# Patient Record
Sex: Female | Born: 1935 | Race: Black or African American | Hispanic: No | Marital: Married | Smoking: Never smoker
Health system: Southern US, Community
[De-identification: ages and names within clinical notes are randomized; demographics above are authoritative.]

## PROBLEM LIST (undated history)

## (undated) DIAGNOSIS — F0390 Unspecified dementia without behavioral disturbance: Secondary | ICD-10-CM

## (undated) DIAGNOSIS — C50919 Malignant neoplasm of unspecified site of unspecified female breast: Secondary | ICD-10-CM

## (undated) DIAGNOSIS — I208 Other forms of angina pectoris: Secondary | ICD-10-CM

## (undated) DIAGNOSIS — E785 Hyperlipidemia, unspecified: Secondary | ICD-10-CM

## (undated) DIAGNOSIS — I4891 Unspecified atrial fibrillation: Secondary | ICD-10-CM

## (undated) DIAGNOSIS — I10 Essential (primary) hypertension: Secondary | ICD-10-CM

## (undated) DIAGNOSIS — I2089 Other forms of angina pectoris: Secondary | ICD-10-CM

## (undated) DIAGNOSIS — I219 Acute myocardial infarction, unspecified: Secondary | ICD-10-CM

## (undated) DIAGNOSIS — K5900 Constipation, unspecified: Secondary | ICD-10-CM

## (undated) DIAGNOSIS — N289 Disorder of kidney and ureter, unspecified: Secondary | ICD-10-CM

## (undated) DIAGNOSIS — K219 Gastro-esophageal reflux disease without esophagitis: Secondary | ICD-10-CM

## (undated) HISTORY — PX: LYMPHADENECTOMY: SHX15

## (undated) HISTORY — PX: HIATAL HERNIA REPAIR: SHX195

## (undated) HISTORY — PX: BREAST SURGERY: SHX581

---

## 1997-12-26 ENCOUNTER — Ambulatory Visit (HOSPITAL_COMMUNITY): Admission: RE | Admit: 1997-12-26 | Discharge: 1997-12-26 | Payer: Self-pay | Admitting: *Deleted

## 1998-05-21 ENCOUNTER — Ambulatory Visit (HOSPITAL_COMMUNITY): Admission: RE | Admit: 1998-05-21 | Discharge: 1998-05-21 | Payer: Self-pay | Admitting: *Deleted

## 1998-08-08 ENCOUNTER — Encounter: Payer: Self-pay | Admitting: Ophthalmology

## 1998-08-11 ENCOUNTER — Ambulatory Visit (HOSPITAL_COMMUNITY): Admission: RE | Admit: 1998-08-11 | Discharge: 1998-08-11 | Payer: Self-pay | Admitting: Ophthalmology

## 1999-04-20 ENCOUNTER — Other Ambulatory Visit: Admission: RE | Admit: 1999-04-20 | Discharge: 1999-04-20 | Payer: Self-pay | Admitting: *Deleted

## 1999-07-16 ENCOUNTER — Encounter: Admission: RE | Admit: 1999-07-16 | Discharge: 1999-07-16 | Payer: Self-pay | Admitting: *Deleted

## 1999-11-12 ENCOUNTER — Encounter: Payer: Self-pay | Admitting: Ophthalmology

## 1999-11-16 ENCOUNTER — Ambulatory Visit (HOSPITAL_COMMUNITY): Admission: RE | Admit: 1999-11-16 | Discharge: 1999-11-16 | Payer: Self-pay | Admitting: Ophthalmology

## 2000-07-18 ENCOUNTER — Encounter: Admission: RE | Admit: 2000-07-18 | Discharge: 2000-07-18 | Payer: Self-pay | Admitting: Internal Medicine

## 2000-07-18 ENCOUNTER — Encounter: Payer: Self-pay | Admitting: Internal Medicine

## 2000-10-18 ENCOUNTER — Ambulatory Visit (HOSPITAL_COMMUNITY): Admission: RE | Admit: 2000-10-18 | Discharge: 2000-10-18 | Payer: Self-pay | Admitting: Ophthalmology

## 2003-02-01 ENCOUNTER — Encounter: Admission: RE | Admit: 2003-02-01 | Discharge: 2003-02-01 | Payer: Self-pay | Admitting: Internal Medicine

## 2003-02-01 ENCOUNTER — Encounter: Payer: Self-pay | Admitting: Internal Medicine

## 2004-02-11 ENCOUNTER — Encounter: Admission: RE | Admit: 2004-02-11 | Discharge: 2004-02-11 | Payer: Self-pay | Admitting: Internal Medicine

## 2005-03-05 ENCOUNTER — Encounter: Admission: RE | Admit: 2005-03-05 | Discharge: 2005-03-05 | Payer: Self-pay | Admitting: Internal Medicine

## 2006-03-10 ENCOUNTER — Encounter: Admission: RE | Admit: 2006-03-10 | Discharge: 2006-03-10 | Payer: Self-pay | Admitting: Internal Medicine

## 2006-03-15 ENCOUNTER — Encounter: Admission: RE | Admit: 2006-03-15 | Discharge: 2006-03-15 | Payer: Self-pay | Admitting: Internal Medicine

## 2006-03-15 ENCOUNTER — Encounter (INDEPENDENT_AMBULATORY_CARE_PROVIDER_SITE_OTHER): Payer: Self-pay | Admitting: Specialist

## 2006-05-03 DIAGNOSIS — I219 Acute myocardial infarction, unspecified: Secondary | ICD-10-CM

## 2006-05-03 HISTORY — DX: Acute myocardial infarction, unspecified: I21.9

## 2006-05-10 ENCOUNTER — Encounter: Admission: RE | Admit: 2006-05-10 | Discharge: 2006-05-10 | Payer: Self-pay | Admitting: Surgery

## 2006-05-11 ENCOUNTER — Encounter (INDEPENDENT_AMBULATORY_CARE_PROVIDER_SITE_OTHER): Payer: Self-pay | Admitting: Surgery

## 2006-05-11 ENCOUNTER — Encounter (INDEPENDENT_AMBULATORY_CARE_PROVIDER_SITE_OTHER): Payer: Self-pay | Admitting: Specialist

## 2006-05-11 ENCOUNTER — Encounter: Admission: RE | Admit: 2006-05-11 | Discharge: 2006-05-11 | Payer: Self-pay | Admitting: Surgery

## 2006-05-11 ENCOUNTER — Ambulatory Visit (HOSPITAL_BASED_OUTPATIENT_CLINIC_OR_DEPARTMENT_OTHER): Admission: RE | Admit: 2006-05-11 | Discharge: 2006-05-11 | Payer: Self-pay | Admitting: Surgery

## 2006-05-19 ENCOUNTER — Ambulatory Visit: Payer: Self-pay | Admitting: Oncology

## 2006-05-30 ENCOUNTER — Ambulatory Visit: Admission: RE | Admit: 2006-05-30 | Discharge: 2006-08-03 | Payer: Self-pay | Admitting: *Deleted

## 2006-05-30 LAB — CBC WITH DIFFERENTIAL/PLATELET
Basophils Absolute: 0 10*3/uL (ref 0.0–0.1)
EOS%: 1.3 % (ref 0.0–7.0)
Eosinophils Absolute: 0.1 10*3/uL (ref 0.0–0.5)
HCT: 35.2 % (ref 34.8–46.6)
HGB: 12.1 g/dL (ref 11.6–15.9)
MCH: 32 pg (ref 26.0–34.0)
MCV: 93 fL (ref 81.0–101.0)
MONO%: 16.7 % — ABNORMAL HIGH (ref 0.0–13.0)
NEUT#: 4.6 10*3/uL (ref 1.5–6.5)
NEUT%: 55.9 % (ref 39.6–76.8)
Platelets: 384 10*3/uL (ref 145–400)

## 2006-05-30 LAB — COMPREHENSIVE METABOLIC PANEL
AST: 17 U/L (ref 0–37)
Albumin: 3.9 g/dL (ref 3.5–5.2)
Alkaline Phosphatase: 47 U/L (ref 39–117)
BUN: 18 mg/dL (ref 6–23)
Calcium: 9.2 mg/dL (ref 8.4–10.5)
Chloride: 105 mEq/L (ref 96–112)
Creatinine, Ser: 1.27 mg/dL — ABNORMAL HIGH (ref 0.40–1.20)
Glucose, Bld: 101 mg/dL — ABNORMAL HIGH (ref 70–99)

## 2006-09-06 ENCOUNTER — Ambulatory Visit: Payer: Self-pay | Admitting: Cardiology

## 2006-09-06 ENCOUNTER — Inpatient Hospital Stay (HOSPITAL_COMMUNITY): Admission: EM | Admit: 2006-09-06 | Discharge: 2006-09-13 | Payer: Self-pay | Admitting: Emergency Medicine

## 2006-09-07 ENCOUNTER — Encounter: Payer: Self-pay | Admitting: Cardiology

## 2006-11-25 ENCOUNTER — Ambulatory Visit: Payer: Self-pay | Admitting: Oncology

## 2007-03-13 ENCOUNTER — Encounter: Admission: RE | Admit: 2007-03-13 | Discharge: 2007-03-13 | Payer: Self-pay | Admitting: Surgery

## 2007-05-08 ENCOUNTER — Ambulatory Visit: Payer: Self-pay | Admitting: Cardiology

## 2007-05-08 ENCOUNTER — Inpatient Hospital Stay (HOSPITAL_COMMUNITY): Admission: EM | Admit: 2007-05-08 | Discharge: 2007-06-01 | Payer: Self-pay | Admitting: Emergency Medicine

## 2008-03-14 ENCOUNTER — Encounter: Admission: RE | Admit: 2008-03-14 | Discharge: 2008-03-14 | Payer: Self-pay | Admitting: Internal Medicine

## 2008-05-27 ENCOUNTER — Encounter (HOSPITAL_BASED_OUTPATIENT_CLINIC_OR_DEPARTMENT_OTHER): Admission: RE | Admit: 2008-05-27 | Discharge: 2008-07-04 | Payer: Self-pay | Admitting: Internal Medicine

## 2008-09-19 ENCOUNTER — Encounter: Admission: RE | Admit: 2008-09-19 | Discharge: 2008-09-19 | Payer: Self-pay | Admitting: Internal Medicine

## 2009-01-12 IMAGING — CT CT ANGIO CHEST
2 of 5 series · 18 of 36 positions shown · IV contrast (APPLIED)
Comparison: Plain film earlier today. No CT's.

CLINICAL DATA: Right upper quadrant pain. Recent lumpectomy and radiation
therapy.

CT ANGIOGRAPHY OF CHEST - PULMONARY EMBOLISM PROTOCOL
TECHNIQUE: Multidetector CT imaging of the chest was performed according to the
protocol for detection of pulmonary embolism during bolus injection of
intravenous contrast.  Coronal and sagittal plane CT angiographic image
reconstructions were also generated.
Contrast:  80 cc Omnipaque 300

[Series 6: pe 1.0 b40f thins for pacs · axial · 0.56mm/px · z∈[-261,-47]mm · 15 of 246 slices shown]
[im 16/246  lung]
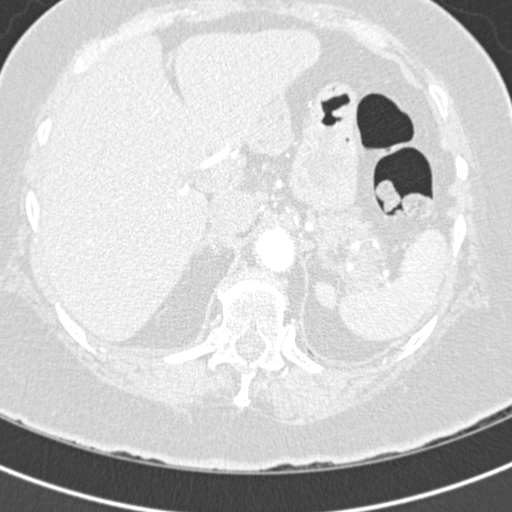
[im 31/246  mediastinal]
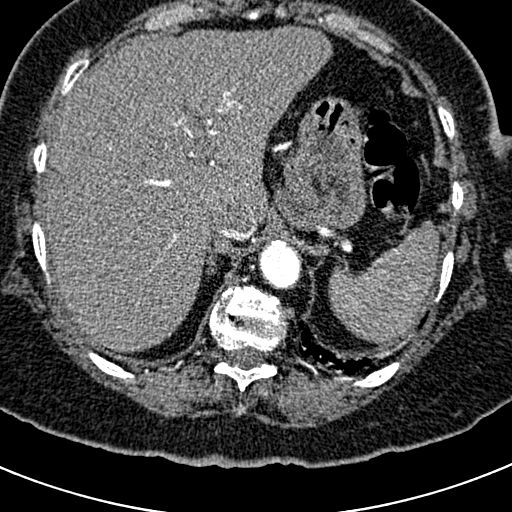
[im 46/246  lung]
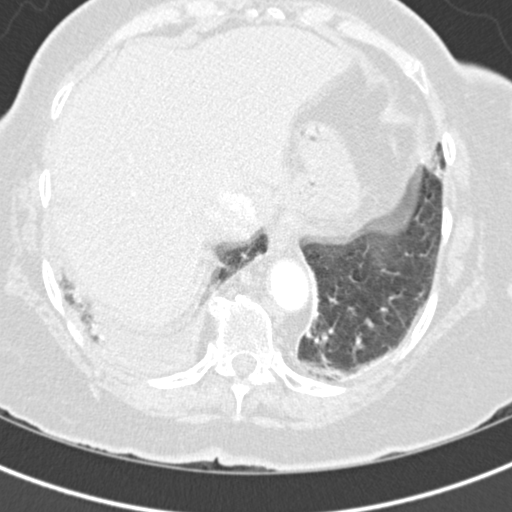
[im 62/246  mediastinal]
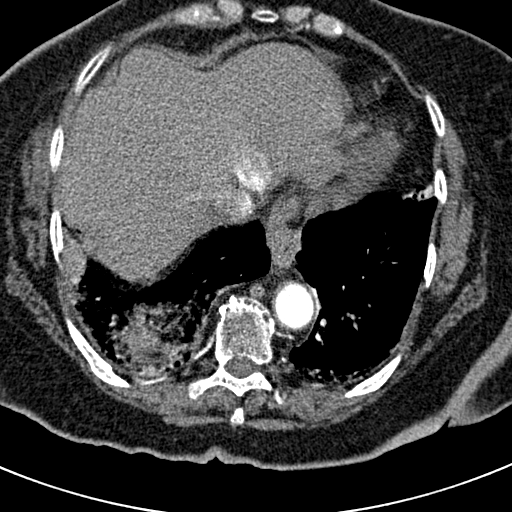
[im 77/246  lung]
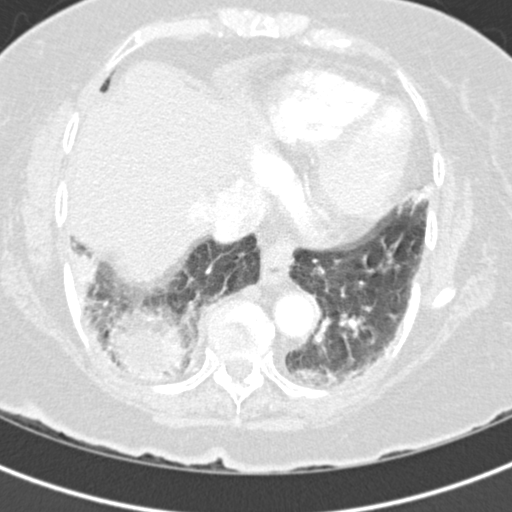
[im 92/246  mediastinal]
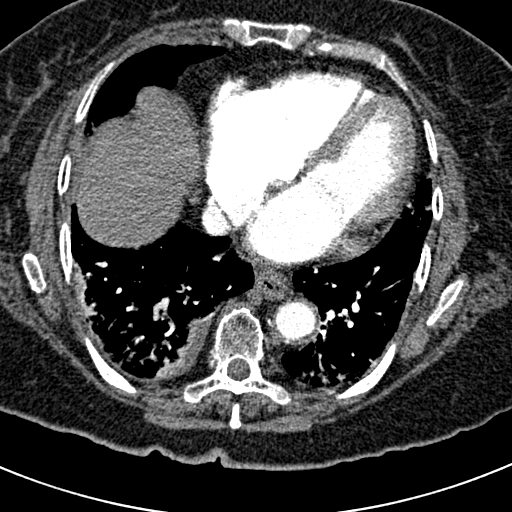
[im 108/246  lung]
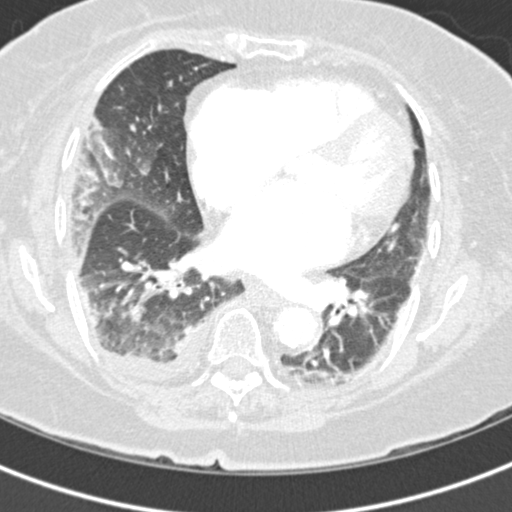
[im 123/246  mediastinal]
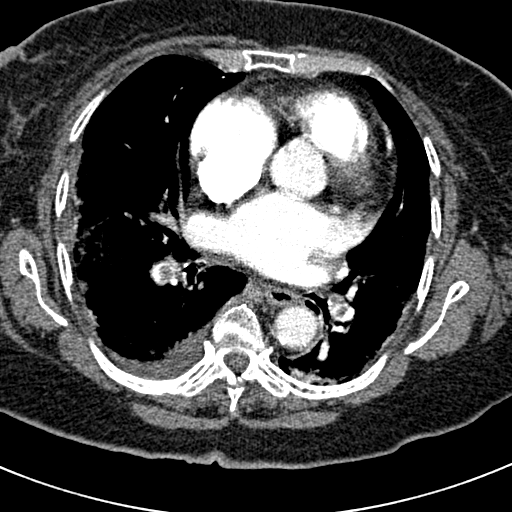
[im 138/246  lung]
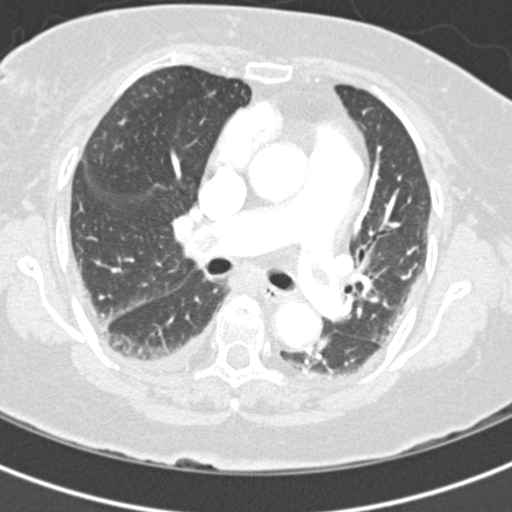
[im 154/246  mediastinal]
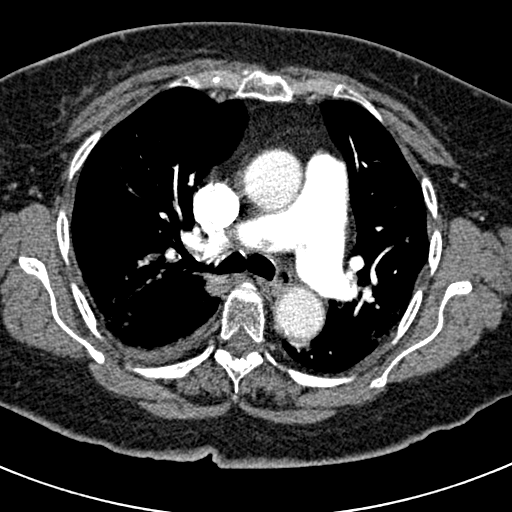
[im 169/246  lung]
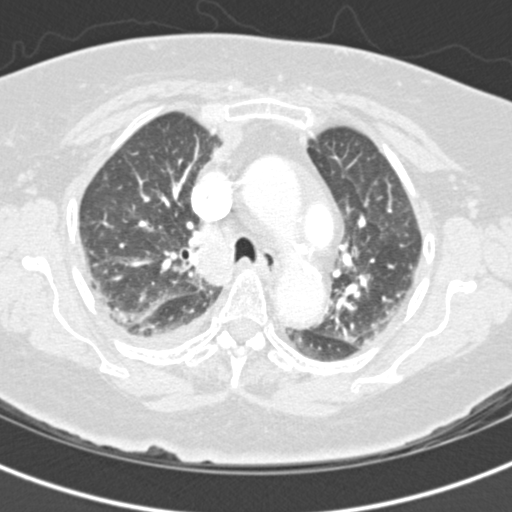
[im 184/246  mediastinal]
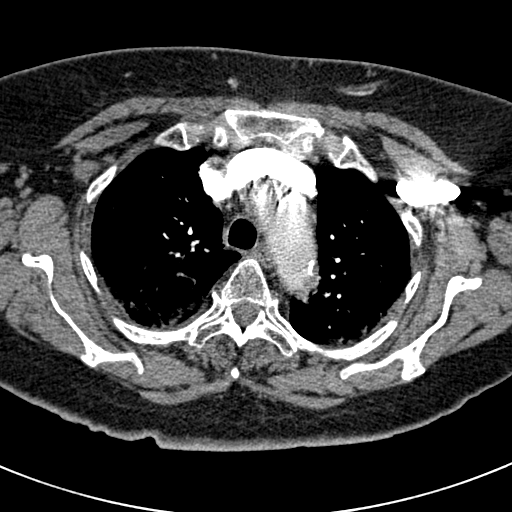
[im 200/246  lung]
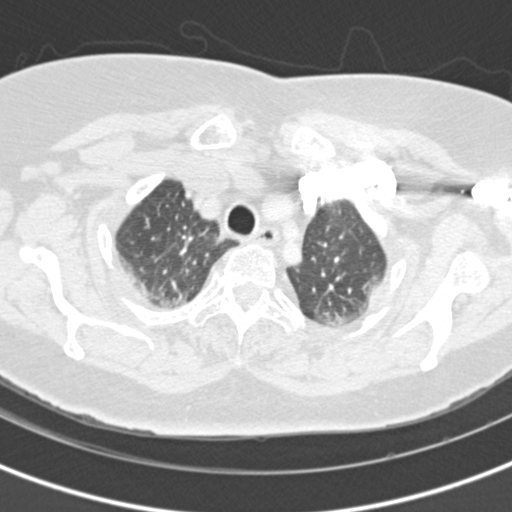
[im 215/246  mediastinal]
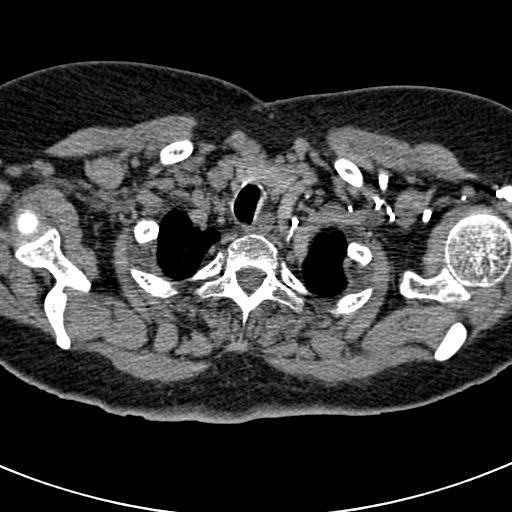
[im 230/246  lung]
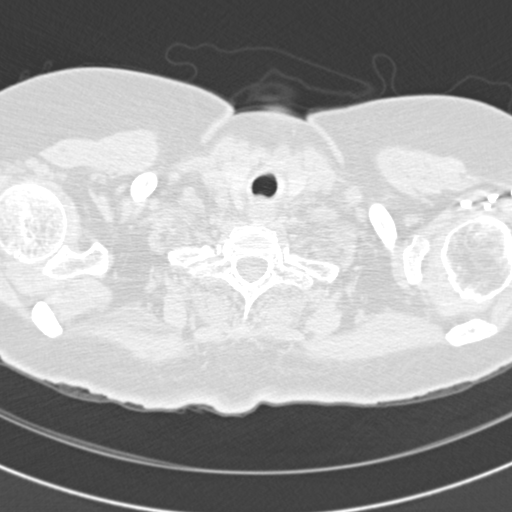

[Series 602: <mpr range> · coronal · 0.56mm/px · 3 of 67 slices shown]
[im 14/67  mediastinal]
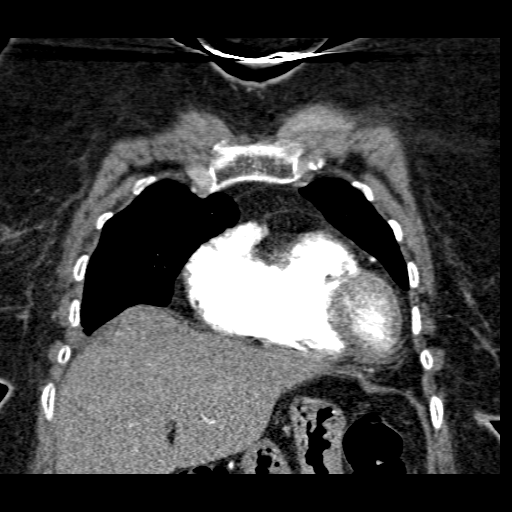
[im 27/67  mediastinal]
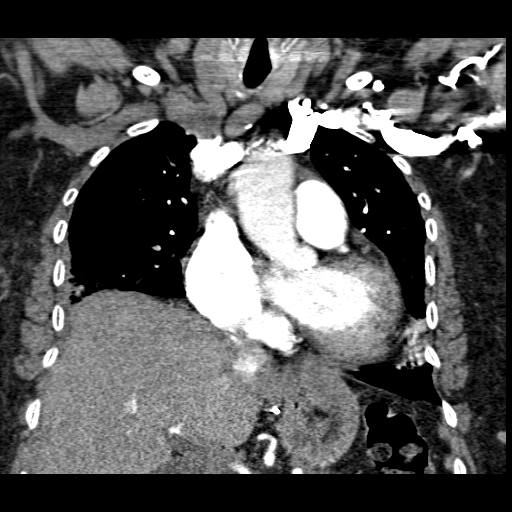
[im 40/67  mediastinal]
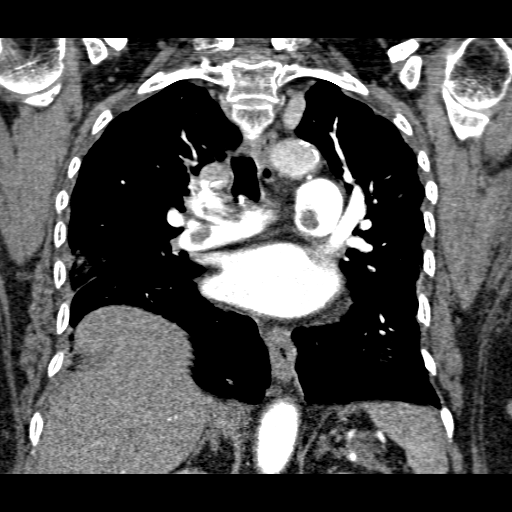

[18 of 36 positions shown; findings below may reference images not displayed]

FINDINGS: Lung windows demonstrate probable vague 3 to 4 mm nodule in right
upper lobe on image 35. Subpleural interlobular septal thickening and
groundglass attenuation in right middle lobe likely related to evolving
radiation pneumonitis.

Right lower lobe opacity may relate to atelectasis or infarct. Moderate amount
of motion artifact involving the lower lobes bilaterally.

Soft tissue windows:  The quality of this exam for evaluation of pulmonary
embolism is good/sufficient. .

Large embolic burden at branch of main pulmonary artery into right and left
pulmonary arteries. Extensive lobar embolic burden greater on right than left.
Findings of increased right heart pressure.

Increased number of right supraclavicular lymph nodes none of which are
pathologic by size.

Moderate cardiomegaly. No pericardial effusion. Small right pleural effusion.
Small hiatal hernia. No mediastinal or hilar adenopathy.

Surgical changes at GE junction. No worrisome osseous lesion.

IMPRESSION

1. Extensive pulmonary embolus. Findings of right heart strain. I called this
report Dr. Seghatoleslami prior to this dictation at approximately [DATE] a.m.
2. Suspect right lower lobe infarct or atelectasis.
3. Developing radiation change at lateral right lung. 
4. probable tiny lung nodule with increased number of right supraclavicular
lymph nodes. Attention on followup. 
5. Small hiatal hernia.

## 2010-09-15 NOTE — Consult Note (Signed)
NAMEWILHELMINA, HARK                ACCOUNT NO.:  [id number]   MEDICAL RECORD NO.:  [id number]          PATIENT TYPE:  INP   LOCATION:  1226                         FACILITY:  [facility]   PHYSICIAN:  Anselm Pancoast. Weatherly, M.D.DATE OF BIRTH:  [birth year]   DATE OF CONSULTATION:  DATE OF DISCHARGE:                                 CONSULTATION   REQUESTING PHYSICIAN:  Hospitalist for rule out small bowel obstruction.   HISTORY:  Sherryn Pollino Care is an 77 year old female who was admitted  approximately 2 weeks ago with onset of worsening low back pain that is  presumably due to a definitely spondylosis of the lower lumbar sacral  area.  The patient previously has had multiple large abdominal  surgeries, GYN and hiatus hernia procedure, and has had a routine of  basically laxatives which we have identified as Metamucil from [employer]  that she takes twice a day, and as long as she was active and etc., her  bowel function were regular.  She was hospitalized with a acute onset of  back pain and admitted on May 08, 2007, and a ortho consult was  obtained.  She has a history of chronic atrial fibrillation, and during  the first few days of her hospitalization, she was treated with pain  medication.  She did have initially an elevated white count and  basically immobilization and then started becoming gaseous, had problems  with diarrhea for which they have actually even used a rectal tube.  The  C. diff's have been repeatedly negative, and after about 2 weeks of  hospitalization, a surgical consultation was requested.  I first saw the  patient on the 91, 17, 19th.  At that time, she had had a CT several  days earlier that had not shown may ischemic obviously colon or an  obvious bowel obstruction but was consistent with an ileus.   PHYSICAL EXAMINATION:  GENERAL:  She was lying in bed, appeared  comfortable.  She is quite gaseous and has a well-healed large midline  incision, but it was not  tender in any quadrant of her abdomen.  Plain x-  rays on the previous day or two had definitely shown a significant small  bowel dilatation, but there was gas in the colon, and there has even  been extreme dilatation before the NG was placed of her stomach.  Her  potassium was also quite low at about 2.4 and with correcting of the  hypokalemia and cutting back on the narcotics, her bowel functions did  improve, and over the next 48 hours she did not have extreme bilious NG  drainage and was more like kind of usual gastric contents.  Yesterday,  we repeated the CT, since there was still some question the rectal tube  has been removed and she is not having the extensive diarrhea at this  time, and the CT yesterday did not show any thickening areas of her  colon.  Her gallbladder is still present, not acutely dilated or any  symptoms, and they are still more gaseous in the small bowel than usual  when she  burps a lot, but she is having bowel movements after the  contrast of yesterday's CT.  She is fully anticoagulated.  The  orthopedic people have seen her and suggested that she had a steroid  shot of the lower back, it was with a full anticoagulation that makes  the steroid injection not possible.   This morning, her potassium is 3.1 and it has been as high as 3.8  yesterday, so she is obviously going to be oral potassium replacement,  and I would go ahead and start her on a full liquid diet and a laxative  of choice, whether it is milk of magnesia or any other laxative.  She  presently has been chronically taking the Metamucil, and then tried to  address the back issues since the pain has been the issue that she was  initially immobilized.  I think the immobilization and the narcotics are  greatly, if not causing, the intestinal distention.  There is no  evidence of any hernias, incisionals or groin or pelvis on the repeat  CTs or physical exam, and she is not tender on the abdominal exam  at  this time.  The patient has been encouraged to be up in a chair and  moving around, and there is many excuses while she is fearful in  attempting this, but I think that without the mobilization, we are not  going to get any good bowel function irregularity.  I think there is no  indication for any surgical intervention at this time, and hopefully it  not will be needed.  Her diarrhea, the etiology was never definitely  determined, is not known the cause.  She is presently on antibiotics, and I am not sure exactly what, I think  presumably it was for a possible pneumonia.  I think the up in a chair  and etc. with greatly assist that also, and we will continue following  her, but I think her primary problem is the back problem and activities,  and less narcotics would greatly improve her intestinal function.           ______________________________  Anselm Pancoast. Zachery Dakins, M.D.     WJW/MEDQ  D:  05/26/2007  T:  05/26/2007  Job:  [id number]   cc:   Anselm Pancoast. Zachery Dakins, M.D.  [address]  [place]  [place] [address]   Patient Chart

## 2010-09-15 NOTE — Discharge Summary (Signed)
Lisa Nixon, Lisa Nixon                ACCOUNT NO.:  [id number]   MEDICAL RECORD NO.:  [id number]          PATIENT TYPE:  INP   LOCATION:  1444                         FACILITY:  [facility]   PHYSICIAN:  Beckey Rutter, MD  DATE OF BIRTH:  17-Aug-1922   DATE OF ADMISSION:  05/08/2007  DATE OF DISCHARGE:  06/01/2007                               DISCHARGE SUMMARY   ADDENDUM:  Please amend this discharge summary to the previous discharge  summary dictated by Dr. Marthann Schiller and to the previous discharge  summation dictated earlier during the hospital course.  To avoid  redundancy, I will not go into detail about her hospital course or her  discharge diagnosis, but I will concentrate on providing the medication  list.   DISCHARGE DIAGNOSIS:  Please refer to the previously dictated discharge  summary.   DISCHARGE MEDICATIONS:  1. Digoxin 0.25 mg p.o. every other day.  2. Digoxin 0.125 mg every 48 hours.  Dose medication should be      alternating (i.e. one day the patient will take 0.2 mg and the      other day she will take 0.125 mg).  3. Tiazac/Diltiazem 300 mg p.o. daily.  4. Ensure 2 cans daily.  5. Lidocaine topical application daily.  6. Ditropan 2.5 mg p.o. t.i.d.  7. K-Dur 20 mEq p.o. daily.  8. Inderal/propranolol 50 mg p.o. b.i.d.  9. Zocor 40 mg p.o. q.h.s.  10.Hydromorphone/Dilaudid 1-2 mg p.o. q.4h. for severe pain.  11.Coumadin, 1-4 mg p.o. h.s.  Dose should be adjusted as per INR.      Please note the patient was on Coumadin previously which was      discontinued for the patient's epidural injection which was done on      May 30, 2007 by interventional radiology.  Now the patient will      have her Coumadin reinstated for a therapeutic level of INR of 2 to      3.  This is not necessarily to bridge with Lovenox/ unfractionated      heparin because of the fact that the patient was already on      Coumadin and was shortly discontinued.  12.Senokot one tab p.o.  q.h.s.   The patient is stable to be released to a skilled nursing facility today  for rehabilitation purposes.  The patient is severely deconditioned now  and she cannot be discharged to home.      Beckey Rutter, MD  Electronically Signed     EME/MEDQ  D:  06/01/2007  T:  06/01/2007  Job:  [id number]

## 2010-09-15 NOTE — Consult Note (Signed)
Lisa Nixon, Lisa Nixon                ACCOUNT NO.:  [id number]   MEDICAL RECORD NO.:  [id number]          PATIENT TYPE:  REC   LOCATION:  FOOT                         FACILITY:  [facility]   PHYSICIAN:  Jonelle Sports. Sevier, M.D. DATE OF BIRTH:  08-20-1922   DATE OF CONSULTATION:  05/29/2008  DATE OF DISCHARGE:                                 CONSULTATION   HISTORY:  This 77 year old black female was seen at the courtesy of Dr.  [name] for consultation and management of an area on the right  lower extremity thought to represent a possible abscess.   The patient has had several medical problems which will be outlined  below, but basically has been compensated and generally stable.  She  does have a history of old pulmonary embolism 2 years ago and has had  venous studies in her extremities, which have not shown any venous  thrombosis.   With that background history, she began rather abruptly some 3 weeks ago  with an area of extreme redness on the pretibial area of her right lower  extremity and approximately the distal one third of the tibia and this  inflammation spread rapidly toward the medial malleolar area and around  posteriorly on that ankle.  This was recognizes by Dr. [name] as a  cellulitis and she was treated accordingly initially with intramuscular  Rocephin and she was subsequently then changed to oral doxycycline.  She  reported rapid progressive healing of the spreading cellulitis, but  developed a new persisting area of swelling in the proximal aspect of  this area of cellulitis just over the anterior tibial area.  This has  never drained, is not particularly tender, but has not responded to  ongoing course with doxycycline.   She is here now for our evaluation and advice.   PAST SURGICAL HISTORY:  The patient had hiatal hernia repair some 10  years ago, had two C sections in the 1950s and had a lumpectomy from her  right breast in 2007.  She has had other  hospitalizations in conjunction  with heart attack some 10 years ago.  She was also hospitalized about 3  years ago for her pulmonary embolism.   She has no known medicinal allergies.   Her regular medications include;  1. Aciphex 20 mg daily.  2. Diltiazem 300 mg daily.  3. Lanoxin 0.125 mg daily.  4. Bisoprolol 10 mg daily.  5. Klor-Con 20 mEq daily.  6. Simvastatin 40 mg daily.  7. Propranolol 40 mg daily in addition to 10 more mg daily.  8. Hydrocodone APAP p.r.n. pain.  9. Her doxycycline as per present illness.  10.Furosemide 40 mg daily.  11.Coumadin by schedule.  12.Vitamin B12 daily.  13.Vitamin D 1000 units daily.  14.Calcium with vitamin D 600 mg daily.   PERSONAL HISTORY:  The patient is married, does not smoke, use alcohol  or recreational drugs.  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  She now does some  kind of minimal assistive help at [facility].  FAMILY HISTORY:  Not reviewed in detail, but it is known that her mother  had hypertension and have a heart attack.   REVIEW OF SYSTEMS:  HEAD, EYES, EARS, NOSE, AND THROAT:  She wears  hearing aids and glasses, but otherwise does quite well.  PULMONARY:  She has had some shortness of breath in association with her  cardiac problems, but has no known primary lung disease.  She did have  pulmonary embolism 3 years ago.  CARDIAC:  The patient has a history of heart attack some 10 years ago,  has never had apparently any surgical intervention in that regard.  She  is unaware of any rhythm disturbances.  She has had some edema, but this  was well controlled by her current medication.  She has been shortness  of breath at times when her heart disease is out of control as  indicated.  She does not have ongoing angina.  GASTROINTESTINAL:  Does have reflux problems and a great deal of  eructation again treated with medication as indicated.  No history  of  blood in the stool or hematemesis.  GENITOURINARY:  No history of urinary tract problems, kidney stones, or  renal insufficiency.  HEMATOLOGIC:  No problems with anemia or other blood disorders.  MUSCULOSKELETAL:  Had injury to her low back some 30 years ago and also  has some degree of osteoarthritis.  NEUROLOGIC:  She is said to have numbness in the legs thought to be on  the basis of neuropathy, but has never had cerebrovascular accident or  seizures.  ENDOCRINE:  No history of diabetes.   PHYSICAL EXAMINATION:  Blood pressure 146/88, pulse 92, respirations 20,  temperature 97.8.  The patient is in no distress, appears healthy, and  actually appears younger than her stated age of 15.  She is in no  respiratory distress at the moment.  Her pulse is noted in the lower  extremity to be somewhat irregular and this is confirmed by handheld  Doppler examination, which shows frequent premature contractions and  shows also a dorsalis pedis signal in the right lower extremity, which  is monophasic and posterior tibial, which is biphasic.  Her extremities  show trace edema bilaterally.  There is discoloration of the skin  consistent with her recent cellulitis on the distal right lower  extremity and at approximately the junction of the mid and distal third  of the pretibial area of localized swelling measuring approximately 3 x  2.5 cm and protruding some 0.7 cm from the surface.  This is soft,  possibly fluctuant, but cool to temperature and nontender to touch.   IMPRESSION:  Probable persistent abscess, pretibial area of right lower  extremity, likely now sterile after antibiotic course.   DISPOSITION:  The area is cleansed with iodine and then alcohol and then  anesthetized with 1% lidocaine without epinephrine.  With the use of an  18 gauge needle, the attempt was made to aspirate the area and very  little was obtained, although there is some debris and this is sent for   culture.   The area is then more widely incised using a #15 scalpel and attempts  were made to express material from this, very little again was obtained.   Hemostasis was obtained with pressure.  The area was then dressed with  an application of Neosporin covered with an absorptive pad and that  extremity was placed in a Kerlix, Coban wrap with care to keep the  degree of  compression only moderate.   The patient is instructed to leave this in place for the following week  and she is continued on doxycycline 100 mg per day while we await the  culture and see what her clinical progress brings.   Followup visit will be here in 1 week.           ______________________________  Jonelle Sports. Cheryll Cockayne, M.D.     RES/MEDQ  D:  05/29/2008  T:  05/29/2008  Job:  [id number]   cc:   Georgianne Fick, M.D.

## 2010-09-15 NOTE — Assessment & Plan Note (Signed)
[facility]   NAME:  Lisa Nixon, Lisa Nixon                ACCOUNT NO.:  [id number]   MEDICAL RECORD NO.:  [id number]      DATE OF BIRTH:  Apr 20, 1923   PHYSICIAN:  Leonie Man, M.D.         VISIT DATE:                                   OFFICE VISIT   Swollen painful area of the distal right anterior tibial area.   SUBJECTIVE:  The patient feels that her pain is less but it still hurts  somewhat at night and in early morning.  The patient is nondiabetic on  Coumadin with an INR of 2.7 for treatment of her prior PE.  She is  status post aspiration of this area with culture approximately one week  ago.  The culture showed no growth.  The mass is much smaller on today's  evaluation following a course of observation on doxycycline.   OBJECTIVE:  The patient's vital signs are within normal limits.  She is  alert and oriented and quite mobile.  There is a mild fluctuant swelling  of the pretibial area in the right lower extremity.  There is no skin  opening and there is mild hemosiderosis extending down towards the ankle  and around the lesion.  She is very slightly tender on palpation,  however, an aspiration attempted today did not get any return.   ASSESSMENT:  Probable resolving hematoma secondary to a venous stasis  disease on this patient on Coumadin.   PLAN:  We will continue her on doxycycline until she completes this  course.  I will follow up with her in 2 weeks to check for continued  resolution.      Leonie Man, M.D.  Electronically Signed     PB/MEDQ  D:  06/05/2008  T:  06/06/2008  Job:  [id number]
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
## 2010-09-15 NOTE — Discharge Summary (Signed)
NAMETANIJAH, MORAIS                ACCOUNT NO.:  [id number]   MEDICAL RECORD NO.:  [id number]          PATIENT TYPE:  INP   LOCATION:  1226                         FACILITY:  [facility]   PHYSICIAN:  Lonia Blood, M.D.      DATE OF BIRTH:  Jun 16, 1922   DATE OF ADMISSION:  05/08/2007  DATE OF DISCHARGE:                               DISCHARGE SUMMARY   ADDENDUM.   PRIMARY CARE PHYSICIAN:  [employer].   Please refer to dictated discharge summary on May 15, 2007 by Dr.  Ladell Pier.  Between May 16, 2007 and May 23, 2007 the  patient has had changes to her medical situation.   She has the following additional diagnosis  1. Recurrent small-bowel obstruction presumed to be ileus plus severe      constipation from pain medicine.  2. Atrial fibrillation with rapid ventricular response.  3. Persistent nausea and diarrhea.   HOSPITAL COURSE:  1. Small-bowel obstruction.  The patient was noticed to have worsening      abdominal distention.  She gradually lost appetite and was unable      to eat and was becoming nauseated.  She did not have bouts of      vomiting but had some mild diarrhea.  KUB performed on May 20, 2007 showed gaseous distention most consistent with small bowel      obstruction.  At that point NG tube was placed.  IV fluids      restarted and the patient kept n.p.o.  Subsequent abdominal x-ray      on May 21, 2007 showed slight decrease in small-bowel      obstruction with the NG tube in place.  Another KUB on May 22, 2007 also showed distention of bowel, small-bowel more prominent      than colon.  Findings representing partial small-bowel obstruction      or residual ileus.  The patient also had a chest x-ray on May 22, 2007 that showed PICC line in place and NG tube in place.  On      May 22, 2007 also we got surgery consulted Dr. Zachery Dakins.  The      impression is that the patient did not have mechanical  obstruction      rather she had ileus from use of medication as well as severe      hypokalemia that has persisted.  At this point the patient had NG      tube in place.  Her GI tract seemed to be decompressed but her      ileus is not completely resolved.  She is still having some mild      diarrhea.  2. Atrial fibrillation with rapid ventricular response.  The patient      has been on Cardizem, digoxin, etc., however, with her small-bowel      obstruction she has been n.p.o.  She went into rapid atrial      fibrillation on May 22, 2007 and was transferred to the ICU.  She has been on Cardizem drip and p.r.n. Lopressor.  This has been      titrated.  Once the patient's p.o. intake resumes we will convert      this to oral medications.  3. Hypokalemia.  This has persisted probably from diarrhea.  At some      point the patient had a rectal tube which has since been removed.      She is also having massive NG tube secretions which may explain the      diarrhea.  We will correct her potassium as necessary.     Lonia Blood, M.D.  Electronically Signed    LG/MEDQ  D:  05/24/2007  T:  05/25/2007  Job:  [id number]
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
## 2010-09-15 NOTE — Discharge Summary (Signed)
Lisa Nixon, Lisa Nixon                ACCOUNT NO.:  [id number]   MEDICAL RECORD NO.:  [id number]          PATIENT TYPE:  INP   LOCATION:  1444                         FACILITY:  [facility]   PHYSICIAN:  Altha Harm, MDDATE OF BIRTH:  05/23/1922   DATE OF ADMISSION:  05/08/2007  DATE OF DISCHARGE:                               DISCHARGE SUMMARY   DISCHARGE DATE:  Not yet determined.   HISTORY OF PRESENT ILLNESS:  Please see interim discharge summary from  May 24, 2007 by Dr. Mikeal Hawthorne for details of hospital course from  May 15, 2007 to May 24, 2007 and by Dr. [name] from details of  hospital course from May 08, 2007 to May 15, 2007.   DISCHARGE MEDICATIONS:  To be determined at the time of discharge.   FINAL DISCHARGE DIAGNOSES:  Back pain secondary to a grade 1 slip of L4  on L5 with mild spinal stenosis, disc bulge and facet arthropathy and a  small left foraminal disk protrusion and grade 1 slip on L5-S1 without  significant spinal stenosis.   CONSULTATIONS:  Interventional Radiology.   PROCEDURE:  Epidural spinal injection performed on May 30, 2007.   HOSPITAL COURSE:  From May 24, 2007 through May 30, 2007.  1. SMALL BOWEL OBSTRUCTION:  The patient was initially NPO due to her      bowel obstruction. She was started on clear liquids and advanced to      the appropriate diet, which she has been tolerating without any      difficulty.  2. SPINAL STENOSIS WITH BACK PAIN:  The patient's initial presenting      complaint was for back pain, which has been debilitating and very      limiting for her. Initially, she was seen by the orthopedic      surgeons, who recommended that she be seen in the office for an      epidural spinal injection. However, the patient's back pain was so      limited in function, that they felt that the patient could not be      discharged and expected to make any appreciable improvement in her      physical function without  having definitive treatment. Thus,      Interventional Radiology was consulted and the patient had an      epidural spinal injection done on May 30, 2007. The patient has      been working with physical therapy for increased mobilization. It      is expected that the patient will be discharged to a skilled      nursing facility to continue with her therapy.  3. ATRIAL FIBRILLATION WITH RAPID VENTRICULAR RATE:  The patient was      maintained on a Cardizem drip while she was NPO. When the patient      started tolerating diet, she was switched over to oral medications,      which controlled her heart rate very well. The patient's heart rate      has been maintained in the 50's to the 70's since starting  on oral      medications. The patient had been on Coumadin for her atrial      fibrillation, however, the Coumadin had to be held in anticipation      of her epidural spinal injection. The patient has received her      spinal injection and pharmacy is to resume Coumadin, starting      tonight, May 30, 2007. The patient can safely be discharged to      her skilled nursing facility and her Coumadin titrated to a      therapeutic level at that time. Otherwise, there were no other      changes during this portion of her hospital course.   The patient, at this time, is stable. Medically, she is ready for  discharge and I anticipate that the patient will be discharged within  the next 24 hours to a skilled nursing facility. Dr. Tamsen Roers will be  assuming the patient's care, starting on tomorrow, May 31, 2007 and  medications for discharge should be reviewed and dictated by Dr. Tamsen Roers  at the time of discharge.      Altha Harm, MD  Electronically Signed     MAM/MEDQ  D:  05/30/2007  T:  05/30/2007  Job:  [id number]

## 2010-09-15 NOTE — Assessment & Plan Note (Signed)
[facility]   NAME:  Lisa Nixon, Lisa Nixon                ACCOUNT NO.:  [id number]   MEDICAL RECORD NO.:  [id number]      DATE OF BIRTH:  [birth year]   PHYSICIAN:  Leonie Man, M.D.    VISIT DATE:  06/19/2008                                   OFFICE VISIT   PROBLEM:  Swolling at the painful area on the distal anterior tibial  region of the right leg.   The patient is an 77 year old lady who is on Coumadin who noted a  swelling over the anterior tibial region of the leg.  This continues to  be painful over sometime.  She does not quite know how she sustained  that injury.  This was seen on May 29, 2008, [facility] by Dr. Lillia Mountain who aspirated this area and sent off for  cultures.  This culture showed no growth.  The patient was treated at  that time with a Coban wrap and returned on June 05, 2008, for  reevaluation.  By this time, the area of swelling had significantly  decreased, but was still very slightly fluctuant.  Repeat aspiration  attempt did not show any fluid.  She was treated with a light wrap  around the ankle and asked to return in 1 week.  She returns today for  followup.   PHYSICAL EXAMINATION:  Her vital signs were within normal limits.  The  area of fluctuance has not completely gone.  There is some mild  hemosiderosis around the area, but this is nontender and there is no  skin breakdown.   ASSESSMENT:  Continue resolution of right pretibial hematoma and  associated venous stasis disease and this patient on Coumadin.  I will  go ahead and discharge her for followup now.  I asked her to wear a TED  hose for continued compression in this area until I think it completely  resolves.  Follow up will be p.r.n.      Leonie Man, M.D.  Electronically Signed     PB/MEDQ  D:  06/19/2008  T:  06/20/2008  Job:  [id number]
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
## 2010-09-18 NOTE — Op Note (Signed)
[place]  Patient:    Lisa Nixon, Lisa Nixon                       MRN: [id number] Proc. Date: 11/16/99 Adm. Date:  [id number] Disc. Date: [id number] Attending:  Karenann Cai                           Operative Report  PREOPERATIVE DIAGNOSIS:  Immature cataract, right eye.  POSTOPERATIVE DIAGNOSIS:  Immature cataract, right eye.  OPERATION PERFORMED:  Kelman phacoemulsification, cataract right eye.  SURGEON:  Marya Landry. Carlyle Lipa., M.D.  ANESTHESIA:  Local using Xylocaine 2% with Marcaine 0.75% Wydase.  INDICATIONS FOR PROCEDURE:  The patient is a 77 year old lady who has had a cataract extraction from the left eye approximately one year ago.  She complains of blurring of vision with difficulty seeing to read.  She was evaluated and found to have a posterior subcapsular cataract of the right eye with a visual acuity best corrected to 20/60.  Cataract extraction of the right eye was recommended.  She is admitted at this time for that purpose.  DESCRIPTION OF PROCEDURE:  Under the influence of IV sedation, the Van Lint akinesia and retrobulbar anesthesia was given.  The patient was then prepped and draped in the usual manner.  The lid speculum was inserted under the upper and lower lid of the right eye and a 4-0 silk traction suture passed through the belly of the superior rectus muscle for traction.  A fornix based conjunctival flap was turned and hemostasis achieved using cautery.  An incision made in the sclera approximately 1 mm posterior to the limbus.  This incision was dissected down into the clear cornea using the crescent blade.  A side port incision was made at the 1:30 oclock position.  OcuCoat was injected into the eye through the side port incision.  The anterior chamber was then entered through the corneoscleral through an incision at the 11:30 oclock position.  An anterior capsulotomy was done using a bent 25  gauge needle.  The nucleus was hydrodissected using Xylocaine.  The KPE handpiece was passed into the eye and the nucleus was emulsified without difficulty. The residual cortical material was aspirated.  The posterior capsule was polished using an olive tip polisher.  The wound was widened slightly to accommodate a 5 x 6 oval intraocular lens.  This lens was then seated into the eye behind the iris without difficulty.  The anterior chamber was reformed and the pupil was constricted using Miochol.  The corneoscleral wound was then closed by using a single horizontal suture of 10-0 nylon.  Before closing the suture, residual OcuCoat was aspirated from the eye.  After it was ascertained that the wound was airtight and watertight, the conjunctiva was closed using thermal cautery.  1 cc of Celestone and 0.5 cc of gentamicin were then injected subconjunctivally.  Pilopine ointment and Maxitrol ointment were applied along with a patch and Fox shield.  The patient tolerated the procedure well and was discharged to the post anesthesia care unit in satisfactory condition. She is instructed to take Vicodin every four hours as needed for pain and to see me in the office tomorrow for further evaluation.  DISCHARGE DIAGNOSES:  Immature cataract of right eye. DD:  11/16/99 TD:  11/17/99 Job: [id number]

## 2010-09-18 NOTE — Op Note (Signed)
NAMETIENNA, BIENKOWSKI                ACCOUNT NO.:  [id number]   MEDICAL RECORD NO.:  [id number]          PATIENT TYPE:  AMB   LOCATION:  DSC                          FACILITY:  [facility]   PHYSICIAN:  Thomas A. 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   POSTOP DIAGNOSIS:  Right breast mass with atypical ductal hyperplasia.   PROCEDURE:  Right breast needle localized excisional biopsy.   SURGEON:  Dr. Maisie Fus Cornett   ANESTHESIA:  MAC with 30 mL of 0.25% Sensorcaine local with epinephrine.   ESTIMATED BLOOD LOSS:  10 mL   SPECIMEN:  Right breast tissue with localizing wire and preoperatively  placed clip by radiologist, found to be present in the specimen.   INDICATIONS FOR PROCEDURE:  The patient was taken to the operating room  after undergoing right breast needle localization by radiology.  Right  breast was then prepped and draped in sterile fashion.  MAC anesthesia  was initiated and the needle was placed just above the nipple  superiorly.  Local anesthesia was infiltrated just above the nipple  areolar complex and a small incision was made.  The wire was pulled out  through the incision and the tissue around the entire wire to the tip  was dissected out and excised.  The tissue was labeled and sent to  pathology.  Radiographs revealed tissue to be adequate with the  localizing wire and clip in the specimen.  I then inspected the biopsy  cavity.  Hemostasis was excellent.  This was then closed in layers using  3-0 Vicryl for the deep breast tissue and 4-0 Monocryl close the skin.  Steri-Strips and dry dressings were applied.  All final counts of  sponge, needle and instruments found to be correct in this portion of  the case.  The patient was awoke taken to recovery in satisfactory   condition.      Thomas A. Cornett, M.D.  Electronically Signed     TAC/MEDQ  D:  05/11/2006  T:  05/11/2006  Job:  [id number]   cc:   Georgianne Fick, M.D.

## 2010-09-18 NOTE — Discharge Summary (Signed)
[name], Nixon                ACCOUNT NO.:  [id number]   MEDICAL RECORD NO.:  [id number]          PATIENT TYPE:  INP   LOCATION:  1413                         FACILITY:  [facility]   PHYSICIAN:  Elliot Cousin, M.D.    DATE OF BIRTH:  1922-08-13   DATE OF ADMISSION:  09/06/2006  DATE OF DISCHARGE:  09/13/2006                               DISCHARGE SUMMARY   PRIMARY CARE PHYSICIAN:  Georgianne Fick, M.D.   DISCHARGE DIAGNOSES:  1. Bilateral pulmonary emboli with right heart strain per CT scan of      the chest on Sep 06, 2006.  2. A 2D echocardiogram on Sep 07, 2006, revealed overall left      ventricular systolic function was normal, ejection fraction      estimated to be 60%.  Left atrium dilated.  Right ventricle was      moderately dilated.  Right ventricular systolic function was mildly      reduced.  Technically difficult study.  3. Paroxysmal atrial fibrillation with rapid ventricular response.  4. Mild allergic rhinitis.  Treated with Nasonex steroid nasal spray.  5. Deconditioning.  Home health, physical and occupational therapies      were ordered.   SECONDARY DISCHARGE DIAGNOSES:  1. Breast cancer diagnosed November 2007.  2. Status post radiation therapy completed on August 29, 2006.  3. Angina.  4. Hypertension.  5. Dyslipidemia.  6. Gastroesophageal reflux disease.  7. Status post lumpectomy of the right breast January 2008.  8. Status post C-section.  9. Status post hiatal hernia surgery.   MEDICATIONS:  1. Coumadin 5 mg, take one and a half pills daily or as directed  2. Cardizem CD 300 mg daily.  3. Lisinopril, decrease the dose to 40 mg half tablet daily.  4. Inderal 80 mg b.i.d.  5. Nasonex spray, one spray in each nostril daily if needed.  6. Potassium chloride 20 mEq daily.  7. Tramadol 50 mg daily.  8. Aciphex 20 mg daily.  9. Amlodipine 5 mg daily.  10.Darvocet N-100 every four hours p.r.n.  11.Simvastatin 40 mg q.h.s.  12.Isosorbide 60 mg  daily.  13.Lasix 40 mg daily.   DISPOSITION:  The patient was discharged to home in improved and stable  condition on Sep 13, 2006.  She was advised to follow up with her  primary care physician, Dr. Nicholos Nixon on Thursday, Sep 15, 2006, at  11:20 a.m.   PROCEDURES:  1. CT scan of the chest on Sep 06, 2006.  The results revealed      extensive pulmonary embolism into the right and left pulmonary      arteries with extensive lobar embolic burden, greater on the right      than left.  Findings of increased right heart pressure.  Suspect      right lower lobe infarct versus atelectasis.  Developing radiation      change at the lateral right lung.  Probable tiny lung nodule with      increased number of right supraclavicular lymph nodes.  Small      hiatal hernia.  2. A  2D echocardiogram.  The results are above.   HISTORY OF PRESENT ILLNESS:  The patient is an 77 year old African  American woman with a past medical history significant for recent  diagnosis of right sided breast cancer, status post radiation therapy,  who presented to the emergency department on Sep 06, 2006, with a chief  complaint of shortness of breath.  For additional details, please see  the dictated History and Physical by Dr. Olena Nixon.   HOSPITAL COURSE:  #1 - BILATERAL PE WITH RIGHT HEART STRAIN.  The  patient was started on symptomatic treatment with oxygen therapy  titrated to keep her oxygen saturations greater than 92%.  Oxycodone was  given as needed for pain, and Dilaudid as needed for severe pain.  She  was started on full-dose Lovenox and Coumadin per pharmacy.  Given the  findings on the CT scan, particularly with regards to right heart  strain, a 2D echocardiogram was ordered.  The 2D echocardiogram revealed  that the patient had a dilated right ventricle and a mildly reduced  right ventricular systolic function.  Over the course of the  hospitalization, the patient became less symptomatic with regards  to  pleuritic chest pain.  Her INR became therapeutic two days ago at 2.3.  The Coumadin dosing has been titrated up to 7.5 mg daily.  Yesterday,  her INR was 2.3, and today, it is the same.  The patient will therefore  be discharged to home on 7.5 mg of Coumadin daily.  She will follow up  with her primary care physician, Dr. Nicholos Nixon, in two days.  Further  management and Coumadin monitoring per Dr .Lisa Nixon.   #2 - PAROXYSMAL ATRIAL FIBRILLATION WITH RAPID VENTRICULAR RESPONSE.  On  admission, the patient was in sinus tachycardia with a heart rate of 127  beats per minute.  On exam, the following day, the patient's heart was  irregular.  A repeat EKG was ordered and revealed atrial fibrillation  with heart rate of 89 beats per minute.  When the patient was questioned  about a history of atrial fibrillation, she acknowledged that she has a  remote history of an irregular heart beat.  Per her recollection, she  was never treated with Coumadin.  Approximately three days into the  hospitalization, her heart rate became uncontrolled.  She was given  several doses of IV Cardizem.  This was followed by oral Cardizem dosed  q.i.d.  The Inderal, which was prescribed 160 mg daily, was changed to  80 mg b.i.d.  Over the course of the past 24 hours, the patient's heart  rate has been much more controlled.  The Cardizem has now been changed  to 300 mg daily.  She was maintained on Inderal 80 mg b.i.d.  Further  management per Dr.  Nicholos Nixon.   #3 - HYPERTENSION.  The patient's blood pressures remained more or less  stable during the hospitalization.  However, because Cardizem was added  for rate control, the patient was advised to take half of the  Lisinopril.  In other words, the patient was discharged to home on 20 mg  of Lisinopril instead of 40 mg of Lisinopril.  Prior to hospital  discharge, her systolic blood pressure was 146, and her diastolic blood pressure was 72.   #4 -  DECONDITIONING.  The physical and occupational therapists were  consulted.  They both recommended home health physical and occupational  therapies.  At baseline, the patient acknowledged that she is not very  active.  In fact, she no longer does housework.   DISCHARGE LABORATORIES:  Sodium 140, potassium 4.5, chloride 107, CO2  25, glucose 114,  BUN 12, creatinine 1.18, calcium 9.4, PT 29.1, INR  2.6, TSH 0.948 (0.350 to 5.5 within normal limits).      Elliot Cousin, M.D.  Electronically Signed     DF/MEDQ  D:  09/13/2006  T:  09/13/2006  Job:  [id number]   cc:   Georgianne Fick, M.D.  Fax: [phone]

## 2010-09-18 NOTE — H&P (Signed)
Lisa Nixon, Lisa Nixon                ACCOUNT NO.:  [id number]   MEDICAL RECORD NO.:  [id number]          PATIENT TYPE:  EMS   LOCATION:  ED                           FACILITY:  [facility]   PHYSICIAN:  Ladell Pier, M.D.   DATE OF BIRTH:  Sep 10, 1922   DATE OF ADMISSION:  09/06/2006  DATE OF DISCHARGE:                              HISTORY & PHYSICAL   CHIEF COMPLAINT:  Shortness of breath, right-sided chest pain.   HISTORY OF PRESENT ILLNESS:  The patient is a 77 year old African  American female with past medical history significant for multiple  medical problems including a recent diagnosis of breast cancer status  post XRT last dose done August 29, 2006.  The patient stated that this  morning she woke up about 3 a.m. with a right-sided chest pain and some  shortness of breath.  It hurts for her to move or take a deep breath.  She has had no fevers or chills.   PAST MEDICAL HISTORY:  Significant for:  1. Angina.  2. Hypertension.  3. Dyslipidemia.  4. GERD.  5. History of breast cancer diagnosed November, 2007 status post XRT      completed August 29, 2006.   PAST SURGICAL HISTORY:  1. C-section.  2. Hiatal hernia surgery.  3. Lumpectomy of the right breast January 2008.   FAMILY HISTORY:  Noncontributory.   SOCIAL HISTORY:  The patient is married.  She has 4 children.  She  smoked when she was younger,r quit when she was in her 30s.  She lives  with her husband.  She is retired.  She also has some grandchildren.   MEDS:  1. Klor-Con 20 mEq daily.  2. Tramadol 50 mg daily.  3. AcipHex 20 mg daily.  4. Amlodipine 5 mg daily.  5. Darvocet N 100 daily p.r.n.  6. Propranolol 80 mg 2 daily.  7. Lisinopril 40 mg daily.  8. Simvastatin 40 mg q.h.s..  9. Isosorbide 60 mg daily.  10.Lasix 40 mg daily.  11.Aspirin 81 mg daily   ALLERGIES:  No known drug allergies.   REVIEW OF SYSTEMS:  As per stated in HPI.   PHYSICAL EXAM:  VITAL SIGNS:  Temperature 97.4, blood pressure  112/81,  pulse 119, respirations 18, pulse ox 98% on 2 liters.  HEENT: Head is normocephalic, atraumatic.  Pupils equal, round and  reactive to light.  Throat without erythema.  CARDIOVASCULAR:  She is  tachycardiac.  LUNGS:  Decreased breath sounds on the right.  ABDOMEN:  Soft, nontender, nondistended.  Positive bowel sounds.  EXTREMITIES:  No edema.   LABS:  WBC 13.4, hemoglobin 11.7, platelets 263, MCV 93.3, D-dimer 8.83.  Sodium 139, potassium 4.4, chloride 107, CO2 23, BUN 90, creatinine  1.42, glucose 132, PT 14, INR 1.  UA negative.  Chest x-ray showed a right lower lobe opacity, right small pleural  effusion.  CT of the chest shows extensive pulmonary embolism with a right heart  strain, right lower lobe infarct versus atelectasis, tiny lung nodule  that needs to be followed.   ASSESSMENT:  1. Pulmonary embolism.  2. Hypertension.  3. Gastroesophageal reflux disease.  4. Dyslipidemia.  5. Mild anemia.  6. Leukocytosis.  7. Small pulmonary nodule.   PLAN:  The patient will be admitted to telemetry.  She will be given  Coumadin and Lovenox.  She will be continued on her antihypertensive  home meds and also a PPI and her statin medication.  Will repeat her  white count and hemoglobin.  Her elevated white count is probably a left  shift from the stress of having a pulmonary infarct.  We will also send  a note to her primary care doctor to follow up with the pulmonary nodule  outpatient.      Ladell Pier, M.D.  Electronically Signed     NJ/MEDQ  D:  09/06/2006  T:  09/06/2006  Job:  [id number]   cc:   Durwin Reges, M.D.

## 2011-01-21 LAB — DIFFERENTIAL
Basophils Relative: 1
Eosinophils Absolute: 0
Eosinophils Absolute: 0.1
Eosinophils Relative: 1
Eosinophils Relative: 1
Lymphocytes Relative: 5 — ABNORMAL LOW
Lymphs Abs: 0.7
Lymphs Abs: 0.7
Lymphs Abs: 1.2
Monocytes Absolute: 0.3
Monocytes Absolute: 0.8
Monocytes Absolute: 1.2 — ABNORMAL HIGH
Monocytes Relative: 12
Monocytes Relative: 3
Monocytes Relative: 6
Monocytes Relative: 9
Neutro Abs: 7.6
Neutrophils Relative %: 75
Neutrophils Relative %: 79 — ABNORMAL HIGH
Neutrophils Relative %: 89 — ABNORMAL HIGH

## 2011-01-21 LAB — CBC
HCT: 31.9 — ABNORMAL LOW
HCT: 34.1 — ABNORMAL LOW
HCT: 35 — ABNORMAL LOW
HCT: 35.7 — ABNORMAL LOW
HCT: 25.9 — ABNORMAL LOW
HCT: 26.7 — ABNORMAL LOW
HCT: 28.7 — ABNORMAL LOW
HCT: 29.4 — ABNORMAL LOW
HCT: 29.4 — ABNORMAL LOW
HCT: 29.7 — ABNORMAL LOW
HCT: 32.5 — ABNORMAL LOW
Hemoglobin: 11.2 — ABNORMAL LOW
Hemoglobin: 11.4 — ABNORMAL LOW
Hemoglobin: 11.8 — ABNORMAL LOW
Hemoglobin: 12.1
Hemoglobin: 12.3
Hemoglobin: 12.6
Hemoglobin: 10.2 — ABNORMAL LOW
Hemoglobin: 10.2 — ABNORMAL LOW
Hemoglobin: 10.3 — ABNORMAL LOW
Hemoglobin: 10.4 — ABNORMAL LOW
Hemoglobin: 8.9 — ABNORMAL LOW
Hemoglobin: 9.7 — ABNORMAL LOW
MCHC: 34.8
MCHC: 35.1
MCHC: 35.2
MCHC: 35.2
MCHC: 35.4
MCHC: 34.5
MCHC: 34.8
MCHC: 34.8
MCHC: 34.9
MCV: 94
MCV: 94.2
MCV: 93.5
MCV: 94.2
MCV: 94.7
MCV: 94.8
MCV: 95
MCV: 95
Platelets: 234
Platelets: 472 — ABNORMAL HIGH
RBC: 3.1 — ABNORMAL LOW
RBC: 3.42 — ABNORMAL LOW
RBC: 3.55 — ABNORMAL LOW
RBC: 3.61 — ABNORMAL LOW
RBC: 3.71 — ABNORMAL LOW
RBC: 3.79 — ABNORMAL LOW
RBC: 2.73 — ABNORMAL LOW
RBC: 2.82 — ABNORMAL LOW
RBC: 2.98 — ABNORMAL LOW
RBC: 3.1 — ABNORMAL LOW
RBC: 3.13 — ABNORMAL LOW
RBC: 3.15 — ABNORMAL LOW
RBC: 3.47 — ABNORMAL LOW
RDW: 14.3
RDW: 14.3
RDW: 14.6
RDW: 14.9
RDW: 16 — ABNORMAL HIGH
WBC: 15 — ABNORMAL HIGH
WBC: 15.9 — ABNORMAL HIGH
WBC: 19.7 — ABNORMAL HIGH
WBC: 23.3 — ABNORMAL HIGH
WBC: 10.1
WBC: 10.8 — ABNORMAL HIGH
WBC: 13 — ABNORMAL HIGH
WBC: 16.9 — ABNORMAL HIGH
WBC: 8.8
WBC: 9.6

## 2011-01-21 LAB — BASIC METABOLIC PANEL
BUN: 1 — ABNORMAL LOW
BUN: 3 — ABNORMAL LOW
BUN: 5 — ABNORMAL LOW
BUN: 7
CO2: 23
CO2: 24
CO2: 24
CO2: 22
CO2: 23
CO2: 23
CO2: 23
CO2: 23
CO2: 24
Calcium: 8.2 — ABNORMAL LOW
Calcium: 8.4
Calcium: 8.8
Calcium: 8.9
Calcium: 7.6 — ABNORMAL LOW
Calcium: 7.6 — ABNORMAL LOW
Chloride: 107
Chloride: 103
Chloride: 107
Chloride: 109
Chloride: 111
Chloride: 111
Chloride: 113 — ABNORMAL HIGH
Chloride: 113 — ABNORMAL HIGH
Chloride: 113 — ABNORMAL HIGH
Chloride: 113 — ABNORMAL HIGH
Chloride: 113 — ABNORMAL HIGH
Creatinine, Ser: 1.08
Creatinine, Ser: 1.15
Creatinine, Ser: 1.7 — ABNORMAL HIGH
Creatinine, Ser: 0.86
Creatinine, Ser: 0.88
Creatinine, Ser: 0.89
Creatinine, Ser: 0.91
GFR calc Af Amer: 49 — ABNORMAL LOW
GFR calc Af Amer: 55 — ABNORMAL LOW
GFR calc Af Amer: 59 — ABNORMAL LOW
GFR calc Af Amer: >60
GFR calc Af Amer: >60
GFR calc Af Amer: >60
GFR calc Af Amer: >60
GFR calc Af Amer: >60
GFR calc Af Amer: >60
GFR calc Af Amer: >60
GFR calc Af Amer: >60
GFR calc Af Amer: >60
GFR calc non Af Amer: 29 — ABNORMAL LOW
GFR calc non Af Amer: 41 — ABNORMAL LOW
GFR calc non Af Amer: 49 — ABNORMAL LOW
GFR calc non Af Amer: 55 — ABNORMAL LOW
GFR calc non Af Amer: 59 — ABNORMAL LOW
GFR calc non Af Amer: >60
Glucose, Bld: 129 — ABNORMAL HIGH
Glucose, Bld: 141 — ABNORMAL HIGH
Glucose, Bld: 157 — ABNORMAL HIGH
Glucose, Bld: 106 — ABNORMAL HIGH
Glucose, Bld: 110 — ABNORMAL HIGH
Glucose, Bld: 133 — ABNORMAL HIGH
Potassium: 3.2 — ABNORMAL LOW
Potassium: 3.5
Potassium: 3.8
Potassium: 2.6 — CL
Potassium: 3 — ABNORMAL LOW
Potassium: 3 — ABNORMAL LOW
Potassium: 3.1 — ABNORMAL LOW
Potassium: 3.1 — ABNORMAL LOW
Potassium: 4.1
Potassium: 4.2
Sodium: 136
Sodium: 137
Sodium: 137
Sodium: 138
Sodium: 133 — ABNORMAL LOW
Sodium: 138
Sodium: 139
Sodium: 144
Sodium: 144

## 2011-01-21 LAB — PROTIME-INR
INR: 1.3
INR: 1.5
INR: 1.6 — ABNORMAL HIGH
INR: 1.7 — ABNORMAL HIGH
INR: 1.3
INR: 1.5
INR: 1.9 — ABNORMAL HIGH
INR: 1.9 — ABNORMAL HIGH
INR: 2.1 — ABNORMAL HIGH
INR: 2.2 — ABNORMAL HIGH
INR: 2.2 — ABNORMAL HIGH
INR: 2.3 — ABNORMAL HIGH
Prothrombin Time: 18.7 — ABNORMAL HIGH
Prothrombin Time: 28.2 — ABNORMAL HIGH
Prothrombin Time: 22 — ABNORMAL HIGH
Prothrombin Time: 23.8 — ABNORMAL HIGH
Prothrombin Time: 24.5 — ABNORMAL HIGH
Prothrombin Time: 24.8 — ABNORMAL HIGH

## 2011-01-21 LAB — URINALYSIS, ROUTINE W REFLEX MICROSCOPIC
Bilirubin Urine: NEGATIVE
Bilirubin Urine: NEGATIVE
Bilirubin Urine: NEGATIVE
Glucose, UA: NEGATIVE
Nitrite: NEGATIVE
Nitrite: NEGATIVE
Nitrite: NEGATIVE
Protein, ur: 100 — AB
Specific Gravity, Urine: 1.022
Specific Gravity, Urine: 1.014
Urobilinogen, UA: 4 — ABNORMAL HIGH
Urobilinogen, UA: 0.2
pH: 5.5
pH: 7
pH: 5.5

## 2011-01-21 LAB — CLOSTRIDIUM DIFFICILE EIA
C difficile Toxins A+B, EIA: NEGATIVE
C difficile Toxins A+B, EIA: NEGATIVE

## 2011-01-21 LAB — CULTURE, BLOOD (ROUTINE X 2): Culture: NO GROWTH

## 2011-01-21 LAB — PREPARE FRESH FROZEN PLASMA

## 2011-01-21 LAB — URINE CULTURE
Colony Count: NO GROWTH
Special Requests: NEGATIVE

## 2011-01-21 LAB — HEPATIC FUNCTION PANEL
ALT: 28
AST: 21
Albumin: 1.8 — ABNORMAL LOW
Bilirubin, Direct: 0.2
Total Bilirubin: 0.8

## 2011-01-21 LAB — DIGOXIN LEVEL: Digoxin Level: 0.4 — ABNORMAL LOW

## 2011-01-21 LAB — URINE MICROSCOPIC-ADD ON

## 2011-01-21 LAB — URIC ACID: Uric Acid, Serum: 8.9 — ABNORMAL HIGH

## 2011-01-21 LAB — C-REACTIVE PROTEIN: CRP: 6.2 — ABNORMAL HIGH (ref <0.6)

## 2011-01-21 LAB — SEDIMENTATION RATE: Sed Rate: 39 — ABNORMAL HIGH

## 2013-01-31 DIAGNOSIS — F0390 Unspecified dementia without behavioral disturbance: Secondary | ICD-10-CM

## 2013-01-31 HISTORY — DX: Unspecified dementia, unspecified severity, without behavioral disturbance, psychotic disturbance, mood disturbance, and anxiety: F03.90

## 2013-02-05 ENCOUNTER — Other Ambulatory Visit: Payer: Self-pay | Admitting: Internal Medicine

## 2013-02-14 ENCOUNTER — Other Ambulatory Visit: Payer: Self-pay | Admitting: Internal Medicine

## 2013-02-14 DIAGNOSIS — R109 Unspecified abdominal pain: Secondary | ICD-10-CM

## 2013-02-15 ENCOUNTER — Ambulatory Visit
Admission: RE | Admit: 2013-02-15 | Discharge: 2013-02-15 | Disposition: A | Discharge status: Home / Self Care | Payer: Medicare HMO | Source: Ambulatory Visit | Attending: Internal Medicine | Admitting: Internal Medicine

## 2013-02-15 ENCOUNTER — Other Ambulatory Visit: Payer: Self-pay

## 2013-02-15 DIAGNOSIS — R1032 Left lower quadrant pain: Secondary | ICD-10-CM

## 2013-02-15 MED ORDER — IOHEXOL 300 MG/ML  SOLN
100.0000 mL | Freq: Once | INTRAMUSCULAR | Status: AC | PRN
  Administered 2013-02-15: 100 mL via INTRAVENOUS

## 2013-02-18 ENCOUNTER — Inpatient Hospital Stay (HOSPITAL_COMMUNITY)
Admission: EM | Admit: 2013-02-18 | Discharge: 2013-02-26 | DRG: 308 | Disposition: A | Discharge status: Skilled Nursing Facility | Payer: Medicare FFS | Attending: Internal Medicine | Admitting: Internal Medicine

## 2013-02-18 ENCOUNTER — Encounter (HOSPITAL_COMMUNITY): Payer: Self-pay | Admitting: Emergency Medicine

## 2013-02-18 ENCOUNTER — Emergency Department (HOSPITAL_COMMUNITY): Payer: Medicare FFS

## 2013-02-18 DIAGNOSIS — Z86711 Personal history of pulmonary embolism: Secondary | ICD-10-CM

## 2013-02-18 DIAGNOSIS — D259 Leiomyoma of uterus, unspecified: Secondary | ICD-10-CM

## 2013-02-18 DIAGNOSIS — E875 Hyperkalemia: Secondary | ICD-10-CM | POA: Diagnosis present

## 2013-02-18 DIAGNOSIS — L03114 Cellulitis of left upper limb: Secondary | ICD-10-CM

## 2013-02-18 DIAGNOSIS — T460X5A Adverse effect of cardiac-stimulant glycosides and drugs of similar action, initial encounter: Secondary | ICD-10-CM | POA: Diagnosis present

## 2013-02-18 DIAGNOSIS — T68XXXA Hypothermia, initial encounter: Secondary | ICD-10-CM

## 2013-02-18 DIAGNOSIS — K5900 Constipation, unspecified: Secondary | ICD-10-CM | POA: Diagnosis not present

## 2013-02-18 DIAGNOSIS — IMO0002 Reserved for concepts with insufficient information to code with codable children: Secondary | ICD-10-CM | POA: Diagnosis not present

## 2013-02-18 DIAGNOSIS — E119 Type 2 diabetes mellitus without complications: Secondary | ICD-10-CM

## 2013-02-18 DIAGNOSIS — I2510 Atherosclerotic heart disease of native coronary artery without angina pectoris: Secondary | ICD-10-CM | POA: Diagnosis present

## 2013-02-18 DIAGNOSIS — E43 Unspecified severe protein-calorie malnutrition: Secondary | ICD-10-CM | POA: Diagnosis present

## 2013-02-18 DIAGNOSIS — K219 Gastro-esophageal reflux disease without esophagitis: Secondary | ICD-10-CM | POA: Diagnosis present

## 2013-02-18 DIAGNOSIS — D72829 Elevated white blood cell count, unspecified: Secondary | ICD-10-CM

## 2013-02-18 DIAGNOSIS — I498 Other specified cardiac arrhythmias: Principal | ICD-10-CM | POA: Diagnosis present

## 2013-02-18 DIAGNOSIS — Z853 Personal history of malignant neoplasm of breast: Secondary | ICD-10-CM

## 2013-02-18 DIAGNOSIS — N179 Acute kidney failure, unspecified: Secondary | ICD-10-CM

## 2013-02-18 DIAGNOSIS — I959 Hypotension, unspecified: Secondary | ICD-10-CM

## 2013-02-18 DIAGNOSIS — IMO0001 Reserved for inherently not codable concepts without codable children: Secondary | ICD-10-CM

## 2013-02-18 DIAGNOSIS — Y9210 Unspecified residential institution as the place of occurrence of the external cause: Secondary | ICD-10-CM | POA: Diagnosis not present

## 2013-02-18 DIAGNOSIS — I10 Essential (primary) hypertension: Secondary | ICD-10-CM | POA: Diagnosis present

## 2013-02-18 DIAGNOSIS — E8809 Other disorders of plasma-protein metabolism, not elsewhere classified: Secondary | ICD-10-CM | POA: Diagnosis present

## 2013-02-18 DIAGNOSIS — Y849 Medical procedure, unspecified as the cause of abnormal reaction of the patient, or of later complication, without mention of misadventure at the time of the procedure: Secondary | ICD-10-CM | POA: Diagnosis not present

## 2013-02-18 DIAGNOSIS — D75839 Thrombocytosis, unspecified: Secondary | ICD-10-CM

## 2013-02-18 DIAGNOSIS — M1990 Unspecified osteoarthritis, unspecified site: Secondary | ICD-10-CM | POA: Diagnosis present

## 2013-02-18 DIAGNOSIS — N858 Other specified noninflammatory disorders of uterus: Secondary | ICD-10-CM

## 2013-02-18 DIAGNOSIS — D649 Anemia, unspecified: Secondary | ICD-10-CM

## 2013-02-18 DIAGNOSIS — R001 Bradycardia, unspecified: Secondary | ICD-10-CM

## 2013-02-18 DIAGNOSIS — D473 Essential (hemorrhagic) thrombocythemia: Secondary | ICD-10-CM

## 2013-02-18 DIAGNOSIS — I4891 Unspecified atrial fibrillation: Secondary | ICD-10-CM

## 2013-02-18 DIAGNOSIS — E785 Hyperlipidemia, unspecified: Secondary | ICD-10-CM | POA: Diagnosis present

## 2013-02-18 DIAGNOSIS — Z7901 Long term (current) use of anticoagulants: Secondary | ICD-10-CM

## 2013-02-18 HISTORY — DX: Essential (primary) hypertension: I10

## 2013-02-18 HISTORY — DX: Gastro-esophageal reflux disease without esophagitis: K21.9

## 2013-02-18 HISTORY — DX: Other forms of angina pectoris: I20.89

## 2013-02-18 HISTORY — DX: Malignant neoplasm of unspecified site of unspecified female breast: C50.919

## 2013-02-18 HISTORY — DX: Hyperlipidemia, unspecified: E78.5

## 2013-02-18 HISTORY — DX: Other forms of angina pectoris: I20.8

## 2013-02-18 HISTORY — DX: Unspecified atrial fibrillation: I48.91

## 2013-02-18 LAB — PROTIME-INR
INR: 2.36 — ABNORMAL HIGH (ref 0.00–1.49)
Prothrombin Time: 25 seconds — ABNORMAL HIGH (ref 11.6–15.2)

## 2013-02-18 LAB — CBC WITH DIFFERENTIAL/PLATELET
Basophils Absolute: 0 10*3/uL (ref 0.0–0.1)
Eosinophils Absolute: 0 10*3/uL (ref 0.0–0.7)
Eosinophils Relative: 0 % (ref 0–5)
Eosinophils Relative: 0 % (ref 0–5)
HCT: 30.4 % — ABNORMAL LOW (ref 36.0–46.0)
Hemoglobin: 9.2 g/dL — ABNORMAL LOW (ref 12.0–15.0)
Lymphocytes Relative: 7 % — ABNORMAL LOW (ref 12–46)
Lymphs Abs: 2.7 10*3/uL (ref 0.7–4.0)
Lymphs Abs: 1.5 10*3/uL (ref 0.7–4.0)
MCH: 29.1 pg (ref 26.0–34.0)
MCHC: 33.2 g/dL (ref 30.0–36.0)
MCV: 90.5 fL (ref 78.0–100.0)
MCV: 89.1 fL (ref 78.0–100.0)
Monocytes Absolute: 3.1 10*3/uL — ABNORMAL HIGH (ref 0.1–1.0)
Monocytes Relative: 12 % (ref 3–12)
Monocytes Relative: 14 % — ABNORMAL HIGH (ref 3–12)
Neutro Abs: 17.6 10*3/uL — ABNORMAL HIGH (ref 1.7–7.7)
Platelets: 678 10*3/uL — ABNORMAL HIGH (ref 150–400)
RBC: 3.16 MIL/uL — ABNORMAL LOW (ref 3.87–5.11)
RBC: 3.41 MIL/uL — ABNORMAL LOW (ref 3.87–5.11)
RDW: 16.1 % — ABNORMAL HIGH (ref 11.5–15.5)
WBC: 22.3 10*3/uL — ABNORMAL HIGH (ref 4.0–10.5)

## 2013-02-18 LAB — POCT I-STAT 3, ART BLOOD GAS (G3+)
Acid-base deficit: 6 mmol/L — ABNORMAL HIGH (ref 0.0–2.0)
Bicarbonate: 17 mEq/L — ABNORMAL LOW (ref 20.0–24.0)
O2 Saturation: 100 %
TCO2: 18 mmol/L (ref 0–100)
pH, Arterial: 7.427 (ref 7.350–7.450)
pO2, Arterial: 352 mmHg — ABNORMAL HIGH (ref 80.0–100.0)

## 2013-02-18 LAB — COMPREHENSIVE METABOLIC PANEL
ALT: 15 U/L (ref 0–35)
AST: 31 U/L (ref 0–37)
Albumin: 2 g/dL — ABNORMAL LOW (ref 3.5–5.2)
Alkaline Phosphatase: 66 U/L (ref 39–117)
BUN: 29 mg/dL — ABNORMAL HIGH (ref 6–23)
CO2: 17 mEq/L — ABNORMAL LOW (ref 19–32)
Calcium: 11.4 mg/dL — ABNORMAL HIGH (ref 8.4–10.5)
Chloride: 93 mEq/L — ABNORMAL LOW (ref 96–112)
Creatinine, Ser: 2.19 mg/dL — ABNORMAL HIGH (ref 0.50–1.10)
GFR calc Af Amer: 22 mL/min — ABNORMAL LOW (ref >90)
GFR calc non Af Amer: 19 mL/min — ABNORMAL LOW (ref >90)
Glucose, Bld: 238 mg/dL — ABNORMAL HIGH (ref 70–99)
Potassium: 6 mEq/L — ABNORMAL HIGH (ref 3.5–5.1)
Sodium: 130 mEq/L — ABNORMAL LOW (ref 135–145)
Total Bilirubin: 0.4 mg/dL (ref 0.3–1.2)
Total Protein: 7.1 g/dL (ref 6.0–8.3)

## 2013-02-18 LAB — APTT: aPTT: 38 seconds — ABNORMAL HIGH (ref 24–37)

## 2013-02-18 LAB — GLUCOSE, CAPILLARY: Glucose-Capillary: 114 mg/dL — ABNORMAL HIGH (ref 70–99)

## 2013-02-18 LAB — POCT I-STAT, CHEM 8
BUN: 28 mg/dL — ABNORMAL HIGH (ref 6–23)
Calcium, Ion: 1.49 mmol/L — ABNORMAL HIGH (ref 1.13–1.30)
Chloride: 102 mEq/L (ref 96–112)
Creatinine, Ser: 2.5 mg/dL — ABNORMAL HIGH (ref 0.50–1.10)
Glucose, Bld: 234 mg/dL — ABNORMAL HIGH (ref 70–99)
HCT: 29 % — ABNORMAL LOW (ref 36.0–46.0)
Hemoglobin: 9.9 g/dL — ABNORMAL LOW (ref 12.0–15.0)
Potassium: 5.4 mEq/L — ABNORMAL HIGH (ref 3.5–5.1)
Sodium: 131 mEq/L — ABNORMAL LOW (ref 135–145)
TCO2: 17 mmol/L (ref 0–100)

## 2013-02-18 LAB — MRSA PCR SCREENING: MRSA by PCR: NEGATIVE

## 2013-02-18 LAB — DIGOXIN LEVEL: Digoxin Level: 2.3 ng/mL — ABNORMAL HIGH (ref 0.8–2.0)

## 2013-02-18 LAB — PRO B NATRIURETIC PEPTIDE: Pro B Natriuretic peptide (BNP): 16447 pg/mL — ABNORMAL HIGH (ref 0–450)

## 2013-02-18 LAB — POCT I-STAT TROPONIN I: Troponin i, poc: 0.03 ng/mL (ref 0.00–0.08)

## 2013-02-18 LAB — TROPONIN I: Troponin I: <0.3 ng/mL (ref <0.30)

## 2013-02-18 MED ORDER — ACETAMINOPHEN 325 MG PO TABS
650.0000 mg | ORAL_TABLET | ORAL | Status: DC | PRN
  Administered 2013-02-19 – 2013-02-25 (×13): 650 mg via ORAL
  Filled 2013-02-18 (×13): qty 2

## 2013-02-18 MED ORDER — SODIUM BICARBONATE 8.4 % IV SOLN
50.0000 meq | Freq: Once | INTRAVENOUS | Status: AC
  Administered 2013-02-18: 50 meq via INTRAVENOUS

## 2013-02-18 MED ORDER — VITAMIN B-12 1000 MCG PO TABS
1000.0000 ug | ORAL_TABLET | Freq: Every day | ORAL | Status: DC
  Administered 2013-02-18 – 2013-02-26 (×9): 1000 ug via ORAL
  Filled 2013-02-18 (×9): qty 1

## 2013-02-18 MED ORDER — GLUCAGON HCL (RDNA) 1 MG IJ SOLR
1.0000 mg | Freq: Once | INTRAMUSCULAR | Status: AC
  Administered 2013-02-18: 1 mg via INTRAVENOUS
  Filled 2013-02-18: qty 1

## 2013-02-18 MED ORDER — FENTANYL CITRATE 0.05 MG/ML IJ SOLN
INTRAMUSCULAR | Status: AC
  Filled 2013-02-18: qty 2

## 2013-02-18 MED ORDER — FENTANYL CITRATE 0.05 MG/ML IJ SOLN
50.0000 ug | Freq: Once | INTRAMUSCULAR | Status: AC
  Administered 2013-02-18: 50 ug via INTRAVENOUS

## 2013-02-18 MED ORDER — SODIUM CHLORIDE 0.9 % IV SOLN
1.0000 | Freq: Once | INTRAVENOUS | Status: AC
  Administered 2013-02-18: 1 via INTRAVENOUS
  Filled 2013-02-18: qty 40

## 2013-02-18 MED ORDER — CALCIUM CHLORIDE 10 % IV SOLN
INTRAVENOUS | Status: AC | PRN
  Administered 2013-02-18: 1 g via INTRAVENOUS

## 2013-02-18 MED ORDER — NITROGLYCERIN 0.4 MG SL SUBL: 0.4000 mg | SUBLINGUAL_TABLET | SUBLINGUAL | Status: DC | PRN

## 2013-02-18 MED ORDER — ATORVASTATIN CALCIUM 20 MG PO TABS
20.0000 mg | ORAL_TABLET | Freq: Every day | ORAL | Status: DC
  Administered 2013-02-18 – 2013-02-19 (×2): 20 mg via ORAL
  Filled 2013-02-18 (×2): qty 1

## 2013-02-18 MED ORDER — FENTANYL CITRATE 0.05 MG/ML IJ SOLN
50.0000 ug | Freq: Once | INTRAMUSCULAR | Status: AC
  Administered 2013-02-18: 25 ug via INTRAVENOUS
  Administered 2013-02-18: 50 ug via INTRAVENOUS

## 2013-02-18 MED ORDER — CALCIUM GLUCONATE 10 % IV SOLN: 1.0000 g | Freq: Once | INTRAVENOUS | Status: DC

## 2013-02-18 MED ORDER — VANCOMYCIN HCL IN DEXTROSE 750-5 MG/150ML-% IV SOLN
750.0000 mg | INTRAVENOUS | Status: DC
  Administered 2013-02-18: 750 mg via INTRAVENOUS
  Filled 2013-02-18: qty 150

## 2013-02-18 MED ORDER — ASPIRIN 81 MG PO CHEW: 324.0000 mg | CHEWABLE_TABLET | ORAL | Status: AC

## 2013-02-18 MED ORDER — DOPAMINE-DEXTROSE 3.2-5 MG/ML-% IV SOLN
2.0000 ug/kg/min | Freq: Once | INTRAVENOUS | Status: AC
  Administered 2013-02-18: 5 ug/kg/min via INTRAVENOUS
  Filled 2013-02-18: qty 250

## 2013-02-18 MED ORDER — PHENTOLAMINE MESYLATE 5 MG IJ SOLR
5.0000 mg | Freq: Once | INTRAMUSCULAR | Status: AC
  Administered 2013-02-18: 5 mg via SUBCUTANEOUS
  Filled 2013-02-18: qty 5

## 2013-02-18 MED ORDER — ONDANSETRON HCL 4 MG/2ML IJ SOLN
4.0000 mg | Freq: Four times a day (QID) | INTRAMUSCULAR | Status: DC | PRN
  Filled 2013-02-18: qty 2

## 2013-02-18 MED ORDER — PANTOPRAZOLE SODIUM 40 MG PO TBEC
40.0000 mg | DELAYED_RELEASE_TABLET | Freq: Every day | ORAL | Status: DC
  Administered 2013-02-18 – 2013-02-26 (×9): 40 mg via ORAL
  Filled 2013-02-18 (×9): qty 1

## 2013-02-18 MED ORDER — ATROPINE SULFATE 0.1 MG/ML IJ SOLN
0.5000 mg | Freq: Once | INTRAMUSCULAR | Status: AC
  Administered 2013-02-18: 0.5 mg via INTRAVENOUS

## 2013-02-18 MED ORDER — DEXTROSE 50 % IV SOLN
1.0000 | Freq: Once | INTRAVENOUS | Status: AC
  Administered 2013-02-18: 50 mL via INTRAVENOUS
  Filled 2013-02-18: qty 50

## 2013-02-18 MED ORDER — ASPIRIN 300 MG RE SUPP
300.0000 mg | RECTAL | Status: AC
  Administered 2013-02-18: 300 mg via RECTAL
  Filled 2013-02-18: qty 1

## 2013-02-18 MED ORDER — ASPIRIN EC 81 MG PO TBEC
81.0000 mg | DELAYED_RELEASE_TABLET | Freq: Every day | ORAL | Status: DC
  Administered 2013-02-19 – 2013-02-20 (×2): 81 mg via ORAL
  Filled 2013-02-18 (×3): qty 1

## 2013-02-18 MED ORDER — INSULIN ASPART 100 UNIT/ML ~~LOC~~ SOLN
0.0000 [IU] | Freq: Three times a day (TID) | SUBCUTANEOUS | Status: DC
  Administered 2013-02-19 – 2013-02-23 (×3): 1 [IU] via SUBCUTANEOUS
  Administered 2013-02-23: 2 [IU] via SUBCUTANEOUS
  Administered 2013-02-24: 1 [IU] via SUBCUTANEOUS

## 2013-02-18 MED ORDER — SODIUM CHLORIDE 0.9 % IV SOLN
INTRAVENOUS | Status: DC
  Administered 2013-02-19 – 2013-02-20 (×3): via INTRAVENOUS

## 2013-02-18 MED ORDER — DEXTROSE 5 % IV SOLN
1.0000 g | INTRAVENOUS | Status: DC
  Administered 2013-02-18 – 2013-02-19 (×2): 1 g via INTRAVENOUS
  Filled 2013-02-18 (×3): qty 10

## 2013-02-18 MED ORDER — FENTANYL CITRATE 0.05 MG/ML IJ SOLN
INTRAMUSCULAR | Status: AC
  Administered 2013-02-18: 50 ug via INTRAVENOUS
  Filled 2013-02-18: qty 2

## 2013-02-18 MED ORDER — INSULIN ASPART 100 UNIT/ML ~~LOC~~ SOLN: 0.0000 [IU] | Freq: Every day | SUBCUTANEOUS | Status: DC

## 2013-02-18 MED ORDER — INSULIN ASPART 100 UNIT/ML ~~LOC~~ SOLN
10.0000 [IU] | Freq: Once | SUBCUTANEOUS | Status: AC
  Administered 2013-02-18: 10 [IU] via INTRAVENOUS
  Filled 2013-02-18: qty 1

## 2013-02-18 NOTE — H&P (Signed)
Lisa Nixon is an 79 y.o. female.   Chief Complaint: Status post altered mental status now complaining of generalized bodyache and back pain/frequency of urination HPI: Patient is 79 year old black female with past medical history significant for hypertension, history of atrial fibrillation, history of bilateral pulmonary embolism on chronic Coumadin, hypercholesteremia, GERD, degenerative joint disease with spinal stenosis, was brought to the ER by EMS as patient was found to be confused initially then unresponsive with agonal respiration call EMS patient was noted to be bradycardic hypotensive received atropine and Narcan without improvement so placed on pacer pads and was started on epinephrine drip with improvement in her mentation. Patient is on beta blockers and digoxin which is being reversed. Patient is also being treated for hyperkalemia. Patient presently denies any chest pain shortness of breath complains of generalized bodyache and chronic back pain and also frequency of urination. Patient denies any fever or chills. Patient very vague in her history no further history could be obtained chest family members not present at this point. Patient is being cutaneously paced at present with good perfusion and maintaining blood pressure above 100 systolic.  Past Medical History  Diagnosis Date  . Atrial fibrillation   . Hyperlipemia   . GERD (gastroesophageal reflux disease)   . Hypertension   . Breast cancer   . Angina at rest     Past Surgical History  Procedure Laterality Date  . Breast surgery    . Lymphadenectomy    . Hiatal hernia repair    . Cesarean section      No family history on file. Social History:  reports that she does not use illicit drugs. Her tobacco and alcohol histories are not on file.  Allergies: No Known Allergies   (Not in a hospital admission)  Results for orders placed during the hospital encounter of 02/18/13 (from the past 48 hour(s))  CBC WITH  DIFFERENTIAL     Status: Abnormal   Collection Time    02/18/13  4:30 AM      Result Value Range   WBC 16.2 (*) 4.0 - 10.5 K/uL   RBC 3.16 (*) 3.87 - 5.11 MIL/uL   Hemoglobin 9.2 (*) 12.0 - 15.0 g/dL   HCT 16.1 (*) 09.6 - 04.5 %   MCV 90.5  78.0 - 100.0 fL   MCH 29.1  26.0 - 34.0 pg   MCHC 32.2  30.0 - 36.0 g/dL   RDW 40.9 (*) 81.1 - 91.4 %   Platelets 594 (*) 150 - 400 K/uL   Neutrophils Relative % 71  43 - 77 %   Neutro Abs 11.6 (*) 1.7 - 7.7 K/uL   Lymphocytes Relative 17  12 - 46 %   Lymphs Abs 2.7  0.7 - 4.0 K/uL   Monocytes Relative 12  3 - 12 %   Monocytes Absolute 1.9 (*) 0.1 - 1.0 K/uL   Eosinophils Relative 0  0 - 5 %   Eosinophils Absolute 0.0  0.0 - 0.7 K/uL   Basophils Relative 0  0 - 1 %   Basophils Absolute 0.0  0.0 - 0.1 K/uL  COMPREHENSIVE METABOLIC PANEL     Status: Abnormal   Collection Time    02/18/13  4:30 AM      Result Value Range   Sodium 130 (*) 135 - 145 mEq/L   Potassium 6.0 (*) 3.5 - 5.1 mEq/L   Chloride 93 (*) 96 - 112 mEq/L   CO2 17 (*) 19 - 32 mEq/L  Glucose, Bld 238 (*) 70 - 99 mg/dL   BUN 29 (*) 6 - 23 mg/dL   Creatinine, Ser 4.13 (*) 0.50 - 1.10 mg/dL   Calcium 24.4 (*) 8.4 - 10.5 mg/dL   Total Protein 7.1  6.0 - 8.3 g/dL   Albumin 2.0 (*) 3.5 - 5.2 g/dL   AST 31  0 - 37 U/L   ALT 15  0 - 35 U/L   Alkaline Phosphatase 66  39 - 117 U/L   Total Bilirubin 0.4  0.3 - 1.2 mg/dL   GFR calc non Af Amer 19 (*) >90 mL/min   GFR calc Af Amer 22 (*) >90 mL/min   Comment: (NOTE)     The eGFR has been calculated using the CKD EPI equation.     This calculation has not been validated in all clinical situations.     eGFR's persistently <90 mL/min signify possible Chronic Kidney     Disease.  PRO B NATRIURETIC PEPTIDE     Status: Abnormal   Collection Time    02/18/13  4:30 AM      Result Value Range   Pro B Natriuretic peptide (BNP) 16447.0 (*) 0 - 450 pg/mL  DIGOXIN LEVEL     Status: Abnormal   Collection Time    02/18/13  4:30 AM       Result Value Range   Digoxin Level 2.3 (*) 0.8 - 2.0 ng/mL  POCT I-STAT TROPONIN I     Status: None   Collection Time    02/18/13  4:40 AM      Result Value Range   Troponin i, poc 0.03  0.00 - 0.08 ng/mL   Comment 3            Comment: Due to the release kinetics of cTnI,     a negative result within the first hours     of the onset of symptoms does not rule out     myocardial infarction with certainty.     If myocardial infarction is still suspected,     repeat the test at appropriate intervals.  POCT I-STAT 3, BLOOD GAS (G3+)     Status: Abnormal   Collection Time    02/18/13  4:42 AM      Result Value Range   pH, Arterial 7.427  7.350 - 7.450   pCO2 arterial 25.7 (*) 35.0 - 45.0 mmHg   pO2, Arterial 352.0 (*) 80.0 - 100.0 mmHg   Bicarbonate 17.0 (*) 20.0 - 24.0 mEq/L   TCO2 18  0 - 100 mmol/L   O2 Saturation 100.0     Acid-base deficit 6.0 (*) 0.0 - 2.0 mmol/L   Patient temperature 98.6 F     Collection site RADIAL, ALLEN'S TEST ACCEPTABLE     Drawn by RT     Sample type ARTERIAL    POCT I-STAT, CHEM 8     Status: Abnormal   Collection Time    02/18/13  4:42 AM      Result Value Range   Sodium 131 (*) 135 - 145 mEq/L   Potassium 5.4 (*) 3.5 - 5.1 mEq/L   Chloride 102  96 - 112 mEq/L   BUN 28 (*) 6 - 23 mg/dL   Creatinine, Ser 0.10 (*) 0.50 - 1.10 mg/dL   Glucose, Bld 272 (*) 70 - 99 mg/dL   Calcium, Ion 5.36 (*) 1.13 - 1.30 mmol/L   TCO2 17  0 - 100 mmol/L   Hemoglobin 9.9 (*) 12.0 -  15.0 g/dL   HCT 16.1 (*) 09.6 - 04.5 %  PROTIME-INR     Status: Abnormal   Collection Time    02/18/13  6:02 AM      Result Value Range   Prothrombin Time 25.0 (*) 11.6 - 15.2 seconds   INR 2.36 (*) 0.00 - 1.49   Dg Chest Port 1 View  02/18/2013   CLINICAL DATA:  Chest pain  EXAM: PORTABLE CHEST - 1 VIEW  COMPARISON:  05/22/2007  FINDINGS: Chronic cardiopericardial enlargement. Aortic atherosclerosis. Chronic interstitial coarsening and hyperinflation. No edema, effusion, or  infiltrate. Mottled lucency overlapping the left shoulder is likely gas trapped underneath a pad. No evidence of pneumothorax.  IMPRESSION: Cardiomegaly and COPD.  No evidence of active disease.   Electronically Signed   By: Tiburcio Pea M.D.   On: 02/18/2013 06:27    Review of Systems  Constitutional: Negative for fever and chills.  Eyes: Negative for blurred vision and double vision.  Respiratory: Negative for cough and sputum production.   Cardiovascular: Negative for chest pain and palpitations.  Genitourinary: Positive for frequency and flank pain.  Musculoskeletal: Positive for back pain and joint pain.  Neurological: Negative for headaches.    Blood pressure 120/74, pulse 106, temperature 96.5 F (35.8 C), temperature source Rectal, resp. rate 20, weight 58.968 kg (130 lb), SpO2 98.00%. Physical Exam  Eyes: Conjunctivae are normal. Left eye exhibits no discharge.  Neck: Neck supple. No JVD present. No tracheal deviation present. No thyromegaly present.  Cardiovascular: Normal rate and regular rhythm.   S1-S2 soft no S3 gallop  Respiratory:  Decreased breath sound at bases  GI: Soft. Bowel sounds are normal. She exhibits no distension. There is no tenderness. There is no rebound.  Musculoskeletal: She exhibits no edema and no tenderness.  Neurological:  Awake moaning moving all extremities no obvious neurodeficit     Assessment/Plan Symptomatic bradycardia probably secondary to medication rule out digitoxicity Hypotensive shock probably secondary to medication rule out sepsis Status post altered mental status rule out sepsis rule out narcotics overdose Hypertension History of bilateral pulmonary embolism Hypercholesteremia GERD Degenerative joint disease with spinal stenosis History of breast CA Hypokalemia Rule out UTI Plan Admit to CCU Rule out MI protocol Hold blocking medications Continue transcutaneous pacer check this level patient already received IV  Digibond and glucagon Also received calcium gluconate IV insulin and sodium bicarbonate for hyperkalemia. Will hold off to have a temporary pacemaker for now and monitor closely. Patient may need permanent pacemaker if remains bradycardic despite being off the AV blocking medications. Will empirically treat for UTI  Yaman Grauberger N 02/18/2013, 7:11 AM

## 2013-02-18 NOTE — Progress Notes (Signed)
Per Dr. Sharyn Lull, wants to see what pt's underlying rhythm is and hold off on giving more sedation with the hope of getting the external pacer off.  HR 40's, sinus and bp stable in the 100's with pacer turned off completely.  Dopamine cont @5  mcg. Pt with no complaints.

## 2013-02-18 NOTE — Progress Notes (Signed)
MEDICATION RELATED CONSULT NOTE - INITIAL   Pharmacy Consult for Digibind Indication: Digoxin Toxicity  No Known Allergies  Patient Measurements: Weight: 130 lb (58.968 kg)  Vital Signs: Temp: 96.5 F (35.8 C) (10/19 0732) Temp src: Axillary (10/19 0732) BP: 114/58 mmHg (10/19 0745) Pulse Rate: 56 (10/19 0745) Intake/Output from previous day:   Intake/Output from this shift:    Labs:  Recent Labs  02/18/13 0430 02/18/13 0442  WBC 16.2*  --   HGB 9.2* 9.9*  HCT 28.6* 29.0*  PLT 594*  --   CREATININE 2.19* 2.50*  ALBUMIN 2.0*  --   PROT 7.1  --   AST 31  --   ALT 15  --   ALKPHOS 66  --   BILITOT 0.4  --    CrCl is unknown because there is no height on file for the current visit.  . Lab Results  Component Value Date   DIGOXIN 2.3* 02/18/2013    Microbiology: No results found for this or any previous visit (from the past 720 hour(s)).  Medical History: Past Medical History  Diagnosis Date  . Atrial fibrillation   . Hyperlipemia   . GERD (gastroesophageal reflux disease)   . Hypertension   . Breast cancer   . Angina at rest     Medications:  Prescriptions prior to admission  Medication Sig Dispense Refill  . atorvastatin (LIPITOR) 20 MG tablet Take 20 mg by mouth daily.      . digoxin (LANOXIN) 0.125 MG tablet Take 0.125 mg by mouth daily.      Marland Kitchen diltiazem (TIAZAC) 300 MG 24 hr capsule Take 300 mg by mouth daily.      . Fe Fum-FePoly-Vit C-Vit B3 (INTEGRA PO) Take 1 capsule by mouth daily.      Marland Kitchen lisinopril (PRINIVIL,ZESTRIL) 20 MG tablet Take 20 mg by mouth daily.      . mirtazapine (REMERON) 15 MG tablet Take 15 mg by mouth at bedtime.      Marland Kitchen oxyCODONE-acetaminophen (PERCOCET) 7.5-325 MG per tablet Take 1 tablet by mouth every 8 (eight) hours as needed for pain.      . pantoprazole (PROTONIX) 40 MG tablet Take 40 mg by mouth daily.      . potassium chloride SA (K-DUR,KLOR-CON) 20 MEQ tablet Take 20 mEq by mouth daily.      . propranolol (INDERAL)  40 MG tablet Take 40 mg by mouth 2 (two) times daily.      . vitamin B-12 (CYANOCOBALAMIN) 1000 MCG tablet Take 1,000 mcg by mouth daily.      Marland Kitchen warfarin (COUMADIN) 5 MG tablet Take 2.5-5 mg by mouth daily. Takes 5mg  on Mondays and Wednesdays and takes 2.5mg  on all other days        Admit Complaint: 79 y.o.  female  admitted 02/18/2013 with cardiac arrest.  Pharmacy consulted to dose heparin when INR< goal and dose digifab.  PMH: Afib, hyperlipidemia, GERD, HTN, Breast CA, CAD  Events: Glucagon and 1 vial digifab given in the ED.    Assessment: Anticoagulation: Afib, INR therapeutic  Infectious Disease: sepsis, empiric, WBC 16.2, hypothermic Antibiotics: 10/19 ceftrixaone Cultures:  Cardiovascular: s/p cardiac arrest: Current Weight: 130 lb (58.968 kg)  GI / Nutrition: GERD  Neurology/MSK: intubated and sedated CPOT:  RASS:  CAM ICU:   Nephrology/Urology/Electrolytes/Optho: SCr 2.5  CrCl,   PTA Medication Issues: No Home Meds Ordered: follow up  Best Practices: VTE Prophylaxis:  Therapeutic INR>heparin  Goal of Therapy:  Heparin level 0.3-0.7, elimination  of digoxin  Plan:  Follow up INR 10/20 am DigiFab administered, do not follow up Digoxin conentration for 3-5 days as concentration will be falsely elevated.     Thank you for allowing pharmacy to be a part of this patients care team.  Lovenia Kim Pharm.D., BCPS Clinical Pharmacist 02/18/2013 9:18 AM Pager: [phone] Phone: [phone]

## 2013-02-18 NOTE — Progress Notes (Signed)
ANTICOAGULATION CONSULT NOTE - Initial Consult  Pharmacy Consult for Heparin Indication: h/o PE/Afib  No Known Allergies  Patient Measurements: Weight: 130 lb (58.968 kg)  Vital Signs: Temp: 96.5 F (35.8 C) (10/19 0732) Temp src: Axillary (10/19 0732) BP: 113/62 mmHg (10/19 0732) Pulse Rate: 60 (10/19 0732)  Labs:  Recent Labs  02/18/13 0430 02/18/13 0442 02/18/13 0602  HGB 9.2* 9.9*  --   HCT 28.6* 29.0*  --   PLT 594*  --   --   LABPROT  --   --  25.0*  INR  --   --  2.36*  CREATININE 2.19* 2.50*  --     CrCl is unknown because there is no height on file for the current visit.   Medical History: Past Medical History  Diagnosis Date  . Atrial fibrillation   . Hyperlipemia   . GERD (gastroesophageal reflux disease)   . Hypertension   . Breast cancer   . Angina at rest     Medications:  Lipitor  Digoxin  Tiazac  Integra  Zestril  Remeron  Percocet  Protonix  KCl  Inderal  Vit B12  Assessment: 79 yo female admitted with bradycardia, h/o PE/Afib, Coumadin on hold, for heparin once INR < 2  Goal of Therapy:  Heparin level 0.3-0.7 units/ml Monitor platelets by anticoagulation protocol: Yes   Plan:  Daily INR  Vikki Gains, Gary Fleet 02/18/2013,8:01 AM

## 2013-02-18 NOTE — ED Notes (Signed)
MD at bedside. Cardiology at bedside

## 2013-02-18 NOTE — Progress Notes (Signed)
Chaplain came to consultation A and met pt's family.  Chaplain led family to the Cath lab waiting area where he sat with them and rendered further emotional support.  While in Cath lab waiting area, the on call cardiologist came and spoke to the family.  Cardiologist outlined the options.  After cardiologist left, chaplain led pt's family to the 2 H waiting area, where pt will be transferred.   02/18/13 0600  Clinical Encounter Type  Visited With Family  Visit Type Spiritual support;ED  Referral From Nurse  Spiritual Encounters  Spiritual Needs Emotional    Rulon Abide

## 2013-02-18 NOTE — Progress Notes (Signed)
Utilization Review Completed.Lotoya Casella T10/19/2014  

## 2013-02-18 NOTE — Progress Notes (Signed)
ANTIBIOTIC CONSULT NOTE - INITIAL  Pharmacy Consult for vancomycin Indication: leukocytosis  No Known Allergies  Patient Measurements: Weight: 130 lb (58.968 kg) Adjusted Body Weight: 59 kg  Vital Signs: Temp: 96.2 F (35.7 C) (10/19 1600) Temp src: Oral (10/19 1600) BP: 112/55 mmHg (10/19 1500) Pulse Rate: 39 (10/19 1500) Intake/Output from previous day:   Intake/Output from this shift: Total I/O In: 717.8 [I.V.:667.8; IV Piggyback:50] Out: -   Labs:  Recent Labs  02/18/13 0430 02/18/13 0442 02/18/13 1701  WBC 16.2*  --  22.3*  HGB 9.2* 9.9* 10.1*  PLT 594*  --  678*  CREATININE 2.19* 2.50*  --    CrCl is unknown because there is no height on file for the current visit. No results found for this basename: VANCOTROUGH, Leodis Binet, VANCORANDOM, GENTTROUGH, GENTPEAK, GENTRANDOM, TOBRATROUGH, TOBRAPEAK, TOBRARND, AMIKACINPEAK, AMIKACINTROU, AMIKACIN,  in the last 72 hours   Microbiology: Recent Results (from the past 720 hour(s))  MRSA PCR SCREENING     Status: None   Collection Time    02/18/13 11:10 AM      Result Value Range Status   MRSA by PCR NEGATIVE  NEGATIVE Final   Comment:            The GeneXpert MRSA Assay (FDA     approved for NASAL specimens     only), is one component of a     comprehensive MRSA colonization     surveillance program. It is not     intended to diagnose MRSA     infection nor to guide or     monitor treatment for     MRSA infections.    Medical History: Past Medical History  Diagnosis Date  . Atrial fibrillation   . Hyperlipemia   . GERD (gastroesophageal reflux disease)   . Hypertension   . Breast cancer   . Angina at rest     Medications:  Scheduled:  . [START ON 02/19/2013] aspirin EC  81 mg Oral Daily  . atorvastatin  20 mg Oral Daily  . cefTRIAXone (ROCEPHIN)  IV  1 g Intravenous Q24H  . fentaNYL      . insulin aspart  0-5 Units Subcutaneous QHS  . insulin aspart  0-9 Units Subcutaneous TID WC  . pantoprazole   40 mg Oral Daily  . vitamin B-12  1,000 mcg Oral Daily   Infusions:  . sodium chloride 75 mL (02/18/13 1000)   Assessment: 79 yo female with leukocytosis will be started on vancomycin therapy.  SCr is 2.5 (CrCl ~15)  Goal of Therapy:  Vancomycin trough level 15-20 mcg/ml  Plan:  1) Vancomycin 750mg  iv q48h 2) Follow up renal function and check trough when it's appropriate.  Lisa Nixon, Tsz-Yin 02/18/2013,6:36 PM

## 2013-02-18 NOTE — ED Notes (Signed)
Husband found pt. Unresponsive, agonal breathing, pin point pupils, diaphoretic.  EMS x 1 Narcan, .5 mg atropine, external pacing 200 mA/60 bpm, 60 mcq epi. 1:1/250 cc. CBG 247; piv lt. A/c.  Upon arrival pt. Is alert, following commands.

## 2013-02-18 NOTE — Progress Notes (Signed)
New IV left hand noted to be swollen and painful to touch.  Dopamine turned off.  Due to multiple IV/lab sticks earlier, IV team consulted.  Regitine ordered from Pharmacy for dopamine infiltrate.  IV team at bedside.  Pt tolerating well.  VS remain stable.

## 2013-02-18 NOTE — ED Notes (Signed)
MD at bedside. Consulting Cardiology at bedside.

## 2013-02-18 NOTE — ED Provider Notes (Signed)
TIME SEEN: 4:20 AM  CHIEF COMPLAINT: Bradycardia, unresponsive  HPI: Patient is a 79 year old female with a history of hypertension, hyperlipidemia, prior history of breast cancer status post XRT in 2008, A. fib who presents to the emergency department with bradycardia, unresponsive and agonal respirations. Patient was found by her husband in bed unresponsive with agonal respirations. She was diaphoretic, 10 point pupils. Upon EMS arrival, patient was found to be bradycardic and hypotensive. They gave Narcan with no improvement. She was given 0.5 mg of atropine without improvement. She was then externally paced at 60 beats per minute at 200 milliamps and started on an epinephrine drip. Upon arrival to the ED, patient is awake and alert. Her heart rate is in the 60s by transcutaneous pacing. Her blood pressure is 97/70. Glucose was EMS was 247. Patient is a poor historian given that she is in pain from being paced. She is not sure which medication she takes. Discussed with patient sons who are not sure who her cardiologist is her medical history is either. They agree that they would want pacemaker placement for their mother. She is full code at this time.  ROS: See HPI Constitutional: no fever  Eyes: no drainage  ENT: no runny nose   Cardiovascular:  chest pain  Resp: SOB  GI: no vomiting GU: no dysuria Integumentary: no rash  Allergy: no hives  Musculoskeletal: no leg swelling  Neurological: no slurred speech ROS otherwise negative  PAST MEDICAL HISTORY/PAST SURGICAL HISTORY:  Hypertension, hyperlipidemia, breast cancer, A. Fib  MEDICATIONS:  Prior to Admission medications   Not on File    ALLERGIES:  Allergies not on file  SOCIAL HISTORY:  History  Substance Use Topics  . Smoking status: Not on file  . Smokeless tobacco: Not on file  . Alcohol Use: Not on file    FAMILY HISTORY: No family history on file.  EXAM: There were no vitals taken for this visit. CONSTITUTIONAL:  Alert; talking but confused, in pain, elderly, thin HEAD: Normocephalic EYES: Conjunctivae clear, PERRL ENT: normal nose; no rhinorrhea; moist mucous membranes; pharynx without lesions noted NECK: Supple, no meningismus, no LAD  CARD: Bradycardic, S1 and S2 appreciated, no murmurs, clicks, rubs, gallop RESP: Normal chest excursion without splinting or tachypnea; breath sounds clear and equal bilaterally; no wheezes, no rhonchi, no rales,  ABD/GI: Normal bowel sounds; non-distended; soft, non-tender, no rebound, no guarding BACK:  The back appears normal and is non-tender to palpation, there is no CVA tenderness EXT: Normal ROM in all joints; non-tender to palpation; no edema; normal capillary refill; no cyanosis    SKIN: Normal color for age and race; cool extremities. NEURO: Moves all extremities equally PSYCH: The patient's mood and manner are appropriate. Grooming and personal hygiene are appropriate.  MEDICAL DECISION MAKING: Patient with bradycardia presents to the ED. She's been given 0.5 mg of atropine and is on transcutaneous pacer. She is also on epinephrine drip. In the ED, patient was given another 0.5 mg of atropine, 1 amp of bicarbonate, 2 g IV calcium. EKG shows paced rhythm. She has cool extremities.  Will obtain cardiac labs, chest x-ray, and discuss with cardiology. She is currently critical but stable.  ED PROGRESS: Spoke with cardiology who will see the patient in the ED. Patient's rectal temperature is 96.46F. Will start warming patient using Bair Hugger and warm IV fluids.   Cardiologist, Dr. Wells Guiles, at bedside. He will notify Cath Lab for pacemaker placement. We turned off pacemaker and patient is still bradycardic  in the 30s. Pacemaker restarted. Will start dopamine drip per cardiology request. Patient is still talking, stable.   Date: 02/18/2013  Rate: 60  Rhythm: Paced rhythm  QRS Axis: Wide  Intervals: Wide QRS  ST/T Wave abnormalities: normal  Conduction  Disutrbances: none  Narrative Interpretation: Paced rhythm, artifact present, wide QRS  Pt's cardiologist is Dr. Nadara Eaton.  Patient is on metoprolol and digoxin. Will obtain digoxin level and INR given she is on Coumadin. We'll give glucose, insulin for hyperkalemia and her potassium is 6.0. Discussed with her cardiologist who also recommends giving glucagon, Digibind and he will see the patient in the ED.  Will see if we can reverse patient's bradycardia with medication prior to going to the Cath Lab for pacemaker placement.  Patient son is Chrissie Noa, power of attorney, [phone]  Patient's blood pressure is improving. Her chest x-ray is clear. Digoxin level is high at 2.3.    CRITICAL CARE Performed by: Raelyn Number   Total critical care time: 30 minutes  Critical care time was exclusive of separately billable procedures and treating other patients.  Critical care was necessary to treat or prevent imminent or life-threatening deterioration.  Critical care was time spent personally by me on the following activities: development of treatment plan with patient and/or surrogate as well as nursing, discussions with consultants, evaluation of patient's response to treatment, examination of patient, obtaining history from patient or surrogate, ordering and performing treatments and interventions, ordering and review of laboratory studies, ordering and review of radiographic studies, pulse oximetry and re-evaluation of patient's condition.   Layla Maw Ward, DO 02/18/13 [phone]

## 2013-02-19 LAB — BASIC METABOLIC PANEL
BUN: 34 mg/dL — ABNORMAL HIGH (ref 6–23)
CO2: 17 mEq/L — ABNORMAL LOW (ref 19–32)
Calcium: 9.3 mg/dL (ref 8.4–10.5)
Chloride: 96 mEq/L (ref 96–112)
Creatinine, Ser: 2.12 mg/dL — ABNORMAL HIGH (ref 0.50–1.10)
GFR calc Af Amer: 23 mL/min — ABNORMAL LOW (ref >90)
GFR calc non Af Amer: 20 mL/min — ABNORMAL LOW (ref >90)
Glucose, Bld: 109 mg/dL — ABNORMAL HIGH (ref 70–99)
Potassium: 5.1 mEq/L (ref 3.5–5.1)
Sodium: 129 mEq/L — ABNORMAL LOW (ref 135–145)

## 2013-02-19 LAB — PROTIME-INR
INR: 3.46 — ABNORMAL HIGH (ref 0.00–1.49)
Prothrombin Time: 33.5 seconds — ABNORMAL HIGH (ref 11.6–15.2)

## 2013-02-19 LAB — LIPID PANEL
Cholesterol: 126 mg/dL (ref 0–200)
HDL: 46 mg/dL (ref >39)
LDL Cholesterol: 66 mg/dL (ref 0–99)
Total CHOL/HDL Ratio: 2.7 RATIO
Triglycerides: 70 mg/dL (ref <150)
VLDL: 14 mg/dL (ref 0–40)

## 2013-02-19 LAB — CBC
HCT: 30.3 % — ABNORMAL LOW (ref 36.0–46.0)
Hemoglobin: 9.9 g/dL — ABNORMAL LOW (ref 12.0–15.0)
MCH: 28.8 pg (ref 26.0–34.0)
MCHC: 32.7 g/dL (ref 30.0–36.0)
MCV: 88.1 fL (ref 78.0–100.0)
Platelets: 606 10*3/uL — ABNORMAL HIGH (ref 150–400)
RBC: 3.44 MIL/uL — ABNORMAL LOW (ref 3.87–5.11)
RDW: 16.3 % — ABNORMAL HIGH (ref 11.5–15.5)
WBC: 23.1 10*3/uL — ABNORMAL HIGH (ref 4.0–10.5)

## 2013-02-19 LAB — HEMOGLOBIN A1C
Hgb A1c MFr Bld: 6 % — ABNORMAL HIGH (ref <5.7)
Mean Plasma Glucose: 126 mg/dL — ABNORMAL HIGH (ref <117)

## 2013-02-19 LAB — GLUCOSE, CAPILLARY
Glucose-Capillary: 91 mg/dL (ref 70–99)
Glucose-Capillary: 125 mg/dL — ABNORMAL HIGH (ref 70–99)

## 2013-02-19 LAB — TSH: TSH: 0.673 u[IU]/mL (ref 0.350–4.500)

## 2013-02-19 MED ORDER — DOPAMINE-DEXTROSE 3.2-5 MG/ML-% IV SOLN: 2.0000 ug/kg/min | INTRAVENOUS | Status: DC

## 2013-02-19 MED ORDER — WARFARIN - PHARMACIST DOSING INPATIENT
Freq: Every day | Status: DC
  Administered 2013-02-23: 18:00:00

## 2013-02-19 MED ORDER — HEPARIN SODIUM (PORCINE) 5000 UNIT/ML IJ SOLN: 5000.0000 [IU] | Freq: Three times a day (TID) | INTRAMUSCULAR | Status: DC

## 2013-02-19 NOTE — Progress Notes (Addendum)
Subjective:  Patient complains of feeling achy all over the place through the body, left arm hurting since IV infiltration, chest wall hurting. No shortness of breath, no fever, no chills. Denies any urinary disturbances. She also complains of severe backache. She states that this is an ongoing for several months and it has been worse for the last 2-3 days.  Objective:  Vital Signs in the last 24 hours: Temp:  [96.2 F (35.7 C)-98.4 F (36.9 C)] 97.8 F (36.6 C) (10/20 0338) Pulse Rate:  [39-124] 48 (10/20 0700) Resp:  [12-28] 18 (10/20 0700) BP: (86-163)/(27-142) 109/39 mmHg (10/20 0700) SpO2:  [58 %-100 %] 100 % (10/20 0700) Weight:  [51.4 kg (113 lb 5.1 oz)] 51.4 kg (113 lb 5.1 oz) (10/20 0500)  Intake/Output from previous day: 10/19 0701 - 10/20 0700 In: 1803.3 [P.O.:50; I.V.:1553.3; IV Piggyback:200] Out: 0   Physical Exam:   General appearance: alert, cooperative, appears stated age and no distress Eyes: negative findings: lids and lashes normal Neck: no adenopathy, no carotid bruit, no JVD, supple, symmetrical, trachea midline and thyroid not enlarged, symmetric, no tenderness/mass/nodules Neck: JVP - normal, carotids 2+= without bruits Resp: clear to auscultation bilaterally Chest wall: no tenderness Cardio: S1 is variable, S2 is normal as no gallop or murmur. GI: soft, non-tender; bowel sounds normal; no masses,  no organomegaly Extremities: Trace ankle edema present. Full range of movements.    Lab Results:  Recent Labs  02/18/13 1701 02/19/13 0500  WBC 22.3* 23.1*  HGB 10.1* 9.9*  PLT 678* 606*    Recent Labs  02/18/13 0430 02/18/13 0442 02/19/13 0500  NA 130* 131* 129*  K 6.0* 5.4* 5.1  CL 93* 102 96  CO2 17*  --  17*  GLUCOSE 238* 234* 109*  BUN 29* 28* 34*  CREATININE 2.19* 2.50* 2.12*    Recent Labs  02/18/13 1534 02/19/13 0500  TROPONINI <0.30 <0.30   Hepatic Function Panel  Recent Labs  02/18/13 0430  PROT 7.1  ALBUMIN 2.0*   AST 31  ALT 15  ALKPHOS 66  BILITOT 0.4    Recent Labs  02/19/13 0500  CHOL 126   No results found for this basename: PROTIME,  in the last 72 hours Lipid Panel     Component Value Date/Time   CHOL 126 02/19/2013 0500   TRIG 70 02/19/2013 0500   HDL 46 02/19/2013 0500   CHOLHDL 2.7 02/19/2013 0500   VLDL 14 02/19/2013 0500   LDLCALC 66 02/19/2013 0500     Cardiac Studies:  EKG: 02/20/19 foot in: Junctional escape rhythm at a rate of 52 beats a minute, borderline low-voltage. No evidence of ischemia.   Assessment/Plan:  1. Digitalis toxicity, beta blocker toxicity in a patient presenting with acute renal failure 2. Acute renal failure, hyperkalemia resolved. 3. Severe diffuse myalgias, discontinued Lipitor for now. 4. History of atrial fibrillation, chronic, history of pulmonary embolism on chronic Coumadin therapy 5. Leukocytosis and thrombocytosis, suspect she may have myelodysplastic syndrome, doubt that she has acute sepsis. Urine culture pending.  Recommendation: We'll continue with hydration for now, we'll wean off dopamine, discontinued Lipitor, check CPK enzymes. This is to evaluate for rhabdomyolysis.  Transferred to step down, no indication for pacemaker implantation. Patient on digoxin, beta blocker and calcium channel blocker which is discontinued.   Pamella Pert, M.D. 02/19/2013, 9:18 AM [facility] Pager: [phone] Office: [phone] If no answer: [phone]

## 2013-02-19 NOTE — Progress Notes (Addendum)
ANTICOAGULATION CONSULT NOTE - Initial Consult  Pharmacy Consult for Heparin Indication: h/o PE/Afib  No Known Allergies  Patient Measurements: Weight: 113 lb 5.1 oz (51.4 kg)  Vital Signs: Temp: 97.8 F (36.6 C) (10/20 0338) Temp src: Oral (10/20 0338) BP: 109/39 mmHg (10/20 0700) Pulse Rate: 48 (10/20 0700)  Labs:  Recent Labs  02/18/13 0430 02/18/13 0442 02/18/13 0602 02/18/13 1534 02/18/13 1701 02/19/13 0500  HGB 9.2* 9.9*  --   --  10.1* 9.9*  HCT 28.6* 29.0*  --   --  30.4* 30.3*  PLT 594*  --   --   --  678* 606*  APTT  --   --   --   --  38*  --   LABPROT  --   --  25.0*  --  27.8* 33.5*  INR  --   --  2.36*  --  2.71* 3.46*  CREATININE 2.19* 2.50*  --   --   --  2.12*  TROPONINI  --   --   --  <0.30  --  <0.30    CrCl is unknown because there is no height on file for the current visit.   Medical History: Past Medical History  Diagnosis Date  . Atrial fibrillation   . Hyperlipemia   . GERD (gastroesophageal reflux disease)   . Hypertension   . Breast cancer   . Angina at rest     Medications:  Lipitor  Digoxin  Tiazac  Integra  Zestril  Remeron  Percocet  Protonix  KCl  Inderal  Vit B12  Assessment: 79 yo female admitted with bradycardia, h/o PE/Afib, Coumadin on hold, for heparin once INR < 2.   INR has trended up to 3.4 this am, likley fluctuating d/t binding interactions with supratherapeutic digoxin level which has been treated with digibind. Will continue to hold heparin until INR drifts down below 2.  Goal of Therapy:  Heparin level 0.3-0.7 units/ml Monitor platelets by anticoagulation protocol: Yes   Plan:  Daily INR  Sheppard Coil PharmD., BCPS Clinical Pharmacist Pager [phone] 02/19/2013 8:50 AM  Addendum D/w Dr.Ganji and will d/c plans for heparin and transition back to coumadin when INR trends back down. I would like to see INR trend before restarting warfarin so will hold off on dosing today.  02/19/2013 9:40 AM

## 2013-02-19 NOTE — Progress Notes (Signed)
  Echocardiogram 2D Echocardiogram has been performed.  Dorothey Baseman 02/19/2013, 1:28 PM

## 2013-02-19 NOTE — Progress Notes (Signed)
INITIAL NUTRITION ASSESSMENT  DOCUMENTATION CODES Per approved criteria  -Unable to assess   INTERVENTION: 1.  General healthful diet; encourage intake as able.  Continue to offer meals and supplements. 2.  Other providers; consider referral to SLP if warranted.  NUTRITION DIAGNOSIS: Inadequate oral intake related to poor appetite, body aches as evidenced by RN report.  Monitor:  1.  Food/Beverage; pt meeting >/=90% estimated needs with tolerance. 2.  Wt/wt change; monitor trends  Reason for Assessment: Low Braden  79 y.o. female  Admitting Dx: AMS  ASSESSMENT: Pt admitted with AMS, found down at home.  Pt now with generalized body aches and frequent urination.   Pt is sleeping soundly at time of visit.  Wrapped in several blankets. Per RN, pt is resting and hearing aides (which pt absolutely needs to converse) and not in.   RD notes pt lives at home with elderly husband who is not present at this time.   Per RN, pt consumed 100% of coffee, then bites and sips of other food and beverages offered at meal this AM.  Pt c/o nausea with Ensure this AM.  RN also reports pt with some trouble swallowing pills and bites of food this AM.   No wt hx on file. No ht on file and RD is unable to measure at this time.  RD to follow for ongoing assessment and interventions as appropriate.  Height: Ht Readings from Last 1 Encounters:  No data found for Ht    Weight: Wt Readings from Last 1 Encounters:  02/19/13 113 lb 5.1 oz (51.4 kg)    Ideal Body Weight: unable to assess  % Ideal Body Weight: unable to assess  Wt Readings from Last 10 Encounters:  02/19/13 113 lb 5.1 oz (51.4 kg)    Usual Body Weight: unable to assess  BMI:  There is no height on file to calculate BMI.  Estimated Nutritional Needs: Kcal: 1285-1440 Protein: 61-72g Fluid: 1.5 L/day  Skin: no issues noted  Diet Order: Cardiac  EDUCATION NEEDS: -Education not appropriate at this time   Intake/Output  Summary (Last 24 hours) at 02/19/13 1211 Last data filed at 02/19/13 1000  Gross per 24 hour  Intake 2087.2 ml  Output    150 ml  Net 1937.2 ml    Last BM: 10/19  Labs:   Recent Labs Lab 02/18/13 0430 02/18/13 0442 02/19/13 0500  NA 130* 131* 129*  K 6.0* 5.4* 5.1  CL 93* 102 96  CO2 17*  --  17*  BUN 29* 28* 34*  CREATININE 2.19* 2.50* 2.12*  CALCIUM 11.4*  --  9.3  GLUCOSE 238* 234* 109*    CBG (last 3)   Recent Labs  02/18/13 1649 02/19/13 0757 02/19/13 1148  GLUCAP 114* 91 125*    Scheduled Meds: . aspirin EC  81 mg Oral Daily  . atorvastatin  20 mg Oral Daily  . cefTRIAXone (ROCEPHIN)  IV  1 g Intravenous Q24H  . insulin aspart  0-5 Units Subcutaneous QHS  . insulin aspart  0-9 Units Subcutaneous TID WC  . pantoprazole  40 mg Oral Daily  . vitamin B-12  1,000 mcg Oral Daily  . Warfarin - Pharmacist Dosing Inpatient   Does not apply q1800    Continuous Infusions: . sodium chloride 75 mL/hr at 02/19/13 0981    Past Medical History  Diagnosis Date  . Atrial fibrillation   . Hyperlipemia   . GERD (gastroesophageal reflux disease)   . Hypertension   .  Breast cancer   . Angina at rest     Past Surgical History  Procedure Laterality Date  . Breast surgery    . Lymphadenectomy    . Hiatal hernia repair    . Cesarean section      Loyce Dys, MS RD LDN Clinical Inpatient Dietitian Pager: [phone] Weekend/After hours pager: [phone]

## 2013-02-20 ENCOUNTER — Encounter (HOSPITAL_COMMUNITY): Payer: Self-pay | Admitting: *Deleted

## 2013-02-20 LAB — PROTIME-INR: Prothrombin Time: 39 seconds — ABNORMAL HIGH (ref 11.6–15.2)

## 2013-02-20 LAB — BASIC METABOLIC PANEL
CO2: 19 mEq/L (ref 19–32)
Calcium: 8 mg/dL — ABNORMAL LOW (ref 8.4–10.5)
Chloride: 101 mEq/L (ref 96–112)
Creatinine, Ser: 1.9 mg/dL — ABNORMAL HIGH (ref 0.50–1.10)
GFR calc non Af Amer: 23 mL/min — ABNORMAL LOW (ref >90)
Glucose, Bld: 91 mg/dL (ref 70–99)

## 2013-02-20 LAB — GLUCOSE, CAPILLARY
Glucose-Capillary: 114 mg/dL — ABNORMAL HIGH (ref 70–99)
Glucose-Capillary: 92 mg/dL (ref 70–99)
Glucose-Capillary: 139 mg/dL — ABNORMAL HIGH (ref 70–99)
Glucose-Capillary: 90 mg/dL (ref 70–99)

## 2013-02-20 LAB — URINE CULTURE
Colony Count: NO GROWTH
Culture: NO GROWTH

## 2013-02-20 MED ORDER — LIDOCAINE-PRILOCAINE 2.5-2.5 % EX CREA
TOPICAL_CREAM | CUTANEOUS | Status: DC | PRN
  Administered 2013-02-20 – 2013-02-24 (×4): via TOPICAL
  Filled 2013-02-20: qty 5

## 2013-02-20 MED ORDER — METOPROLOL TARTRATE 25 MG PO TABS
25.0000 mg | ORAL_TABLET | Freq: Two times a day (BID) | ORAL | Status: DC
  Administered 2013-02-20 (×2): 25 mg via ORAL
  Filled 2013-02-20 (×4): qty 1

## 2013-02-20 MED ORDER — FENTANYL CITRATE 0.05 MG/ML IJ SOLN
25.0000 ug | INTRAMUSCULAR | Status: DC | PRN
  Administered 2013-02-20 – 2013-02-21 (×4): 25 ug via INTRAVENOUS
  Filled 2013-02-20 (×4): qty 2

## 2013-02-20 NOTE — Progress Notes (Signed)
PT Cancellation Note  Patient Details Name: KAMI KUBE MRN: [id number] DOB: 03/13/1923   Cancelled Treatment:    Reason Eval/Treat Not Completed: Fatigue limiting ability to participate. Pt eating lunch and then wants to take a nap.  Daughter reports pt was struggling with mobility at home prior to admission.  Husband's mobility also limited.  Daughter and her husband live across town and only able to provide intermittent assist.   Lynnzie Blackson 02/20/2013, 3:05 PM  [facility] [phone]

## 2013-02-20 NOTE — Progress Notes (Signed)
ANTICOAGULATION CONSULT NOTE   Pharmacy Consult for warfarin Indication: h/o PE/Afib  No Known Allergies  Patient Measurements: Weight: 113 lb 5.1 oz (51.4 kg)  Vital Signs: Temp: 98.7 F (37.1 C) (10/21 0800) Temp src: Oral (10/21 0800) BP: 108/46 mmHg (10/21 0800)  Labs:  Recent Labs  02/18/13 0430 02/18/13 0442  02/18/13 0602 02/18/13 1534 02/18/13 1701 02/19/13 0443 02/19/13 0500 02/20/13 0715  HGB 9.2* 9.9*  --   --   --  10.1*  --  9.9*  --   HCT 28.6* 29.0*  --   --   --  30.4*  --  30.3*  --   PLT 594*  --   --   --   --  678*  --  606*  --   APTT  --   --   --   --   --  38*  --   --   --   LABPROT  --   --   < > 25.0*  --  27.8*  --  33.5* 39.0*  INR  --   --   < > 2.36*  --  2.71*  --  3.46* 4.22*  CREATININE 2.19* 2.50*  --   --   --   --   --  2.12* 1.90*  CKTOTAL  --   --   --   --   --   --  49  --   --   TROPONINI  --   --   --   --  <0.30  --   --  <0.30  --   < > = values in this interval not displayed.  CrCl is unknown because there is no height on file for the current visit.  Medications:  Lipitor  Digoxin  Tiazac  Integra  Zestril  Remeron  Percocet  Protonix  KCl  Inderal  Vit B12  Assessment: 79 yo female admitted with bradycardia, h/o PE/Afib, Coumadin on hold.   INR continues to trend up to 4.2 this am. Noted plans for heparin were cancelled yesterday. Will continue to follow daily INR and redose warfarin when appropriate. No bleeding issues noted, no cbc done this morning.  Goal of Therapy:  INR goal 2-3 Monitor platelets by anticoagulation protocol: Yes   Plan:  Daily INR Redose coumadin once INR trending back down  Sheppard Coil PharmD., BCPS Clinical Pharmacist Pager [phone] 02/20/2013 9:10 AM

## 2013-02-20 NOTE — Progress Notes (Addendum)
02/20/2013 1730 Nursing note Pt. C/o increasing pain at IV infiltration site. Site noted to be red/swollen, with area of dark fluid filled blister on LAFA. Swelling noted to extend from left elbow to left hand. Doppler radial pulse found on Left wrist. Dr. Jacinto Halim paged and made aware. No new orders at this time. Reassurance provided to patient. Will continue to closely monitor patient.  Sheana Bir, Blanchard Kelch

## 2013-02-20 NOTE — Progress Notes (Addendum)
Subjective:  Patient complains of feeling achy all over the place through the body, left arm hurting since IV infiltration, Slight skin tear noted.  chest wall hurting. No shortness of breath, no fever, no chills. Denies any urinary disturbances. She also complains of severe backache. Chronic problems of pain. Objective:  Vital Signs in the last 24 hours: Temp:  [97.6 F (36.4 C)-98.7 F (37.1 C)] 98.7 F (37.1 C) (10/21 0800) Pulse Rate:  [81] 81 (10/20 1946) Resp:  [12-28] 19 (10/21 0800) BP: (87-125)/(29-80) 108/46 mmHg (10/21 0800) SpO2:  [96 %-99 %] 99 % (10/21 0800)  Intake/Output from previous day: 10/20 0701 - 10/21 0700 In: 2483.7 [P.O.:540; I.V.:1893.7; IV Piggyback:50] Out: 375 [Urine:375]  Physical Exam:   General appearance: alert, cooperative, appears stated age and no distress Eyes: negative findings: lids and lashes normal Neck: no adenopathy, no carotid bruit, no JVD, supple, symmetrical, trachea midline and thyroid not enlarged, symmetric, no tenderness/mass/nodules Neck: JVP - normal, carotids 2+= without bruits Resp: clear to auscultation bilaterally Chest wall: no tenderness Cardio: regular rate and rhythm, S1, S2 normal, no murmur, click, rub or gallop GI: soft, non-tender; bowel sounds normal; no masses,  no organomegaly Extremities: Trace ankle edema present. Full range of movements. Left forearm 3 plus edema and warm and skin sloughing size of a quarter noted. No signs of acute arterial insufficiency (dopamine infiltrate).    Lab Results:  Recent Labs  02/18/13 1701 02/19/13 0500  WBC 22.3* 23.1*  HGB 10.1* 9.9*  PLT 678* 606*    Recent Labs  02/19/13 0500 02/20/13 0715  NA 129* 132*  K 5.1 4.5  CL 96 101  CO2 17* 19  GLUCOSE 109* 91  BUN 34* 35*  CREATININE 2.12* 1.90*    Recent Labs  02/18/13 1534 02/19/13 0500  TROPONINI <0.30 <0.30   Hepatic Function Panel  Recent Labs  02/18/13 0430  PROT 7.1  ALBUMIN 2.0*  AST 31   ALT 15  ALKPHOS 66  BILITOT 0.4    Recent Labs  02/19/13 0500  CHOL 126   No results found for this basename: PROTIME,  in the last 72 hours Lipid Panel     Component Value Date/Time   CHOL 126 02/19/2013 0500   TRIG 70 02/19/2013 0500   HDL 46 02/19/2013 0500   CHOLHDL 2.7 02/19/2013 0500   VLDL 14 02/19/2013 0500   LDLCALC 66 02/19/2013 0500     Cardiac Studies:  EKG: 02/20/19 foot in: Junctional escape rhythm at a rate of 52 beats a minute, borderline low-voltage. No evidence of ischemia.  02/20/13: A. Fib with V rate 94/min. Non specific ST depression anterior leads, cannot R/O ischemia  Echo 02/19/13: Normal LVEF. Probable trace AS.    Assessment/Plan:  1. Digitalis toxicity, beta blocker toxicity in a patient presenting with acute renal failure. S. Cr improving and trending down. 2. Acute renal failure, hyperkalemia resolved. 3. Severe diffuse myalgias, discontinued Lipitor for now. 4. History of atrial fibrillation, chronic, history of pulmonary embolism on chronic Coumadin therapy. Now A. Fib with RVR.  5. Leukocytosis and thrombocytosis, suspect she may have myelodysplastic syndrome, doubt that she has acute sepsis. Urine culture pending. Antibiotics discontinued.  Recommendation:  Transferred to tele. Resume Coumadin per pharmacy, ambulate. Urine cultures no growth. Probably home tomorrow  Pamella Pert, M.D. 02/20/2013, 11:05 AM [facility] Pager: [phone] Office: [phone] If no answer: [phone]

## 2013-02-20 NOTE — Progress Notes (Signed)
NUTRITION FOLLOW UP  DOCUMENTATION CODES  Per approved criteria   -Severe malnutrition of chronic illness   Intervention:   1. General healthful diet; encourage intake as able.  Continue to offer meals and supplements.  2.  Other providers; consider referral to SLP if warranted. Pt complains of early satiety and nausea.  Belching during visit.   Nutrition Dx:   Inadequate oral intake, ongoing  Monitor:   1. Food/Beverage; pt meeting >/=90% estimated needs with tolerance.  2. Wt/wt change; monitor trends  Assessment:   Pt admitted with AMS, found down at home. Pt now with generalized body aches and frequent urination.  RD met with pt who reports wt loss from 180 lbs "over 1 year ago."  She reports wt loss was slow and not attributed to anything in particular.  Pt reports early satiety and nausea as barriers to intake.  She reports she is not allowed to cook and relies on snacks and meals on wheels.    Dietary recall: Breakfast: often skipped, coffee every morning Lunch:  3 peanut butter crackers, orange juice or water Snacks: Boost daily Dinner:  Provided by Me[employer] Wheels, unable to discern quantity consumed.   RD offered sip of Ensure when requested by pt.  Pt belched prior to offering, and refused more after a small sip citing nausea.  Nutrition Focused Physical Exam: Subcutaneous Fat:  Orbital Region: WNL Upper Arm Region: moderate wasting, also note excessive loose skin and saggyness Thoracic and Lumbar Region: moderate wasting  Muscle:  Temple Region: mild-moderate wasting Clavicle Bone Region: moderate wasting Clavicle and Acromion Bone Region: moderate-severe wasting Scapular Bone Region: moderate wasting Dorsal Hand: unable to assess, swelling Patellar Region: moderate wasting Anterior Thigh Region: moderate-severe wasting, excessive loose skin and saggyness Posterior Calf Region: moderate wasting  Edema: present in upper extremity  Pt meets criteria for  severe MALNUTRITION in the context of chronic as evidenced by reported wt hx, current intake not sufficient to meet >75% of estimated needs at home, subcutaneous fat and muscle wasting.   Height: Ht Readings from Last 1 Encounters:  No data found for Ht    Weight Status:   Wt Readings from Last 1 Encounters:  02/19/13 113 lb 5.1 oz (51.4 kg)    Re-estimated needs:  Kcal: 1285-1440 Protein: 61-72g Fluid: 1.5 L/day  Skin: intact, edema to upper extremity  Diet Order: Dysphagia 3, thin   Intake/Output Summary (Last 24 hours) at 02/20/13 1249 Last data filed at 02/20/13 1200  Gross per 24 hour  Intake   2340 ml  Output    225 ml  Net   2115 ml    Last BM: 10/19   Labs:   Recent Labs Lab 02/18/13 0430 02/18/13 0442 02/19/13 0500 02/20/13 0715  NA 130* 131* 129* 132*  K 6.0* 5.4* 5.1 4.5  CL 93* 102 96 101  CO2 17*  --  17* 19  BUN 29* 28* 34* 35*  CREATININE 2.19* 2.50* 2.12* 1.90*  CALCIUM 11.4*  --  9.3 8.0*  GLUCOSE 238* 234* 109* 91    CBG (last 3)   Recent Labs  02/19/13 2138 02/20/13 0810 02/20/13 1205  GLUCAP 114* 92 139*    Scheduled Meds: . aspirin EC  81 mg Oral Daily  . insulin aspart  0-5 Units Subcutaneous QHS  . insulin aspart  0-9 Units Subcutaneous TID WC  . metoprolol tartrate  25 mg Oral BID  . pantoprazole  40 mg Oral Daily  . vitamin B-12  1,000 mcg Oral Daily  . Warfarin - Pharmacist Dosing Inpatient   Does not apply q1800    Continuous Infusions: . sodium chloride 75 mL/hr at 02/20/13 0518    Loyce Dys, MS RD LDN Clinical Inpatient Dietitian Pager: [phone] Weekend/After hours pager: [phone]

## 2013-02-21 DIAGNOSIS — E43 Unspecified severe protein-calorie malnutrition: Secondary | ICD-10-CM | POA: Diagnosis present

## 2013-02-21 LAB — BASIC METABOLIC PANEL
BUN: 29 mg/dL — ABNORMAL HIGH (ref 6–23)
CO2: 17 mEq/L — ABNORMAL LOW (ref 19–32)
Calcium: 7.9 mg/dL — ABNORMAL LOW (ref 8.4–10.5)
Chloride: 101 mEq/L (ref 96–112)
Creatinine, Ser: 1.26 mg/dL — ABNORMAL HIGH (ref 0.50–1.10)
GFR calc Af Amer: 43 mL/min — ABNORMAL LOW (ref >90)
GFR calc non Af Amer: 37 mL/min — ABNORMAL LOW (ref >90)
Glucose, Bld: 80 mg/dL (ref 70–99)
Potassium: 4.1 mEq/L (ref 3.5–5.1)

## 2013-02-21 LAB — CBC
HCT: 26.4 % — ABNORMAL LOW (ref 36.0–46.0)
Hemoglobin: 8.5 g/dL — ABNORMAL LOW (ref 12.0–15.0)
MCH: 28.5 pg (ref 26.0–34.0)
MCV: 88.6 fL (ref 78.0–100.0)
WBC: 17 10*3/uL — ABNORMAL HIGH (ref 4.0–10.5)

## 2013-02-21 LAB — PROTIME-INR
INR: 3.06 — ABNORMAL HIGH (ref 0.00–1.49)
Prothrombin Time: 30.5 seconds — ABNORMAL HIGH (ref 11.6–15.2)

## 2013-02-21 LAB — GLUCOSE, CAPILLARY
Glucose-Capillary: 84 mg/dL (ref 70–99)
Glucose-Capillary: 91 mg/dL (ref 70–99)
Glucose-Capillary: 117 mg/dL — ABNORMAL HIGH (ref 70–99)

## 2013-02-21 MED ORDER — WARFARIN SODIUM 1 MG PO TABS
1.0000 mg | ORAL_TABLET | Freq: Once | ORAL | Status: AC
  Administered 2013-02-21: 1 mg via ORAL
  Filled 2013-02-21: qty 1

## 2013-02-21 MED ORDER — SILVER SULFADIAZINE 1 % EX CREA
TOPICAL_CREAM | Freq: Two times a day (BID) | CUTANEOUS | Status: DC
  Administered 2013-02-21: 1 via TOPICAL
  Administered 2013-02-21 – 2013-02-26 (×10): via TOPICAL
  Filled 2013-02-21: qty 85

## 2013-02-21 MED ORDER — METOPROLOL TARTRATE 50 MG PO TABS
50.0000 mg | ORAL_TABLET | Freq: Two times a day (BID) | ORAL | Status: DC
  Administered 2013-02-21 – 2013-02-26 (×11): 50 mg via ORAL
  Filled 2013-02-21 (×12): qty 1

## 2013-02-21 MED FILL — Medication: Qty: 1 | Status: AC

## 2013-02-21 NOTE — Consult Note (Addendum)
WOC wound consult note Reason for Consult: Consult requested for left arm wound.  Pt had Dopamine infiltrate when in ICU several days ago and was treated appropriately, according to IV team nurses via phone who were familiar with this case.  Discussed possible topical plan of care with IV team for wound.  There is no evidence-based widley-accepted topical treatment for this type of injury, according to their team. Wound type:Left arm and hand are extremely swollen and painful to the touch.  Inner elbow with 5X5cm intact, dark red fluid-filled blister.  No open wound or drainage or odor at this time.  Family at bedside concerned with plan of care when she is discharged.  Skin underneath blister is dark black.  Wound is high-risk to evolve into full-thickness tissue loss.  This is beyond [facility] scope of practice.  Discussed plan of care with Dr Jacinto Halim.  He plans to have pt obtain follow-up at outpatient wound care center after discharge and they can assess and adjust topical treatment as wound evolves.  Pt could benefit from home health assistance with dressing changes. Dressing procedure/placement/frequency: Silvadene to assist with chemical debridement of nonviable tissue to affected area. Elevate as much as possible to reduce swelling. Please re-consult if further assistance is needed.  Thank-you,  Cammie Mcgee MSN, RN, CWOCN, [place], CNS [phone]

## 2013-02-21 NOTE — Progress Notes (Signed)
Clinical Social Work Department CLINICAL SOCIAL WORK PLACEMENT NOTE 02/21/2013  Patient:  Lisa Nixon, Lisa Nixon  Account Number:  [id number] Admit date:  02/18/2013  Clinical Social Worker:  Carren Rang  Date/time:  02/21/2013 03:03 PM  Clinical Social Work is seeking post-discharge placement for this patient at the following level of care:   SKILLED NURSING   (*CSW will update this form in Epic as items are completed)   02/21/2013  Patient/family provided with [facility] Department of Clinical Social Work's list of facilities offering this level of care within the geographic area requested by the patient (or if unable, by the patient's family).  02/21/2013  Patient/family informed of their freedom to choose among providers that offer the needed level of care, that participate in Medicare, Medicaid or managed care program needed by the patient, have an available bed and are willing to accept the patient.  02/21/2013  Patient/family informed of [facility]' ownership interest in [facility], as well as of the fact that they are under no obligation to receive care at this facility.  PASARR submitted to EDS on 02/21/2013 PASARR number received from EDS on 02/21/2013  FL2 transmitted to all facilities in geographic area requested by pt/family on  02/21/2013 FL2 transmitted to all facilities within larger geographic area on   Patient informed that his/her managed care company has contracts with or will negotiate with  certain facilities, including the following:     Patient/family informed of bed offers received:   Patient chooses bed at  Physician recommends and patient chooses bed at    Patient to be transferred to  on   Patient to be transferred to facility by   The following physician request were entered in Epic:   Additional Comments:  Maree Krabbe, MSW, [employer] [phone]

## 2013-02-21 NOTE — Progress Notes (Signed)
ANTICOAGULATION CONSULT NOTE   Pharmacy Consult for warfarin Indication: h/o PE/Afib  No Known Allergies  Patient Measurements: Weight: 113 lb 5.1 oz (51.4 kg)  Vital Signs: Temp: 98.1 F (36.7 C) (10/22 1306) Temp src: Oral (10/22 1306) BP: 98/57 mmHg (10/22 1306) Pulse Rate: 98 (10/22 1306)  Labs:  Recent Labs  02/18/13 1534  02/18/13 1701 02/19/13 0443 02/19/13 0500 02/20/13 0715 02/21/13 0740 02/21/13 1145  HGB  --   < > 10.1*  --  9.9*  --  8.5*  --   HCT  --   --  30.4*  --  30.3*  --  26.4*  --   PLT  --   --  678*  --  606*  --  526*  --   APTT  --   --  38*  --   --   --   --   --   LABPROT  --   < > 27.8*  --  33.5* 39.0*  --  30.5*  INR  --   < > 2.71*  --  3.46* 4.22*  --  3.06*  CREATININE  --   --   --   --  2.12* 1.90* 1.26*  --   CKTOTAL  --   --   --  49  --   --   --   --   TROPONINI <0.30  --   --   --  <0.30  --   --   --   < > = values in this interval not displayed.  CrCl is unknown because there is no height on file for the current visit.  Assessment: 79 yo female on Coumadin PTA for afib, h/o PE. INR upon admit was 2.71 but then trended up to a peak of 4.22 with no coumadin given 10/19-10/21 (likely fluctuating d/t binding interactions with supratherapeutic digoxin level). INR currently down to 3.06- large drop past 24 hrs. Will give small dose today. Hgb down to 8.5 but no overt bleeding noted.  Goal of Therapy:  INR goal 2-3 Monitor platelets by anticoagulation protocol: Yes   Plan:  Daily INR Coumadin 1mg  po today  Christoper Fabian, PharmD, BCPS Clinical pharmacist, pager [phone] 02/21/2013 3:09 PM

## 2013-02-21 NOTE — Progress Notes (Signed)
Physical Therapy Evaluation Patient Details Name: FRANK PILGER MRN: [id number] DOB: [birth year] Today's Date: 02/21/2013 Time: 4098-1191 PT Time Calculation (min): 11 min  PT Assessment / Plan / Recommendation History of Present Illness  Pt admit with malnutrition, AMS, and digitalis toxicity.  Afib as well.   Clinical Impression  Pt admitted with above. Pt currently with functional limitations due to the deficits listed below (see PT Problem List). Pt may need NHP if husband cannot provide 24 hour care.   Pt will benefit from skilled PT to increase their independence and safety with mobility to allow discharge to the venue listed below.      PT Assessment  Patient needs continued PT services    Follow Up Recommendations  Supervision/Assistance - 24 hour;SNF                Equipment Recommendations  None recommended by PT         Frequency Min 3X/week    Precautions / Restrictions Precautions Precautions: Fall Restrictions Weight Bearing Restrictions: No   Pertinent Vitals/Pain VSS, right UE edematous with pain - nursing aware.  (Hx of lymphedema)      Mobility  Bed Mobility Bed Mobility: Rolling Right;Right Sidelying to Sit;Sitting - Scoot to Edge of Bed Rolling Right: 3: Mod assist;With rail Right Sidelying to Sit: 3: Mod assist;With rails;HOB elevated Sitting - Scoot to Edge of Bed: 3: Mod assist Details for Bed Mobility Assistance: Needed assist and cues for sequencing movement. Transfers Transfers: Sit to Stand;Stand to Sit;Stand Pivot Transfers Sit to Stand: 3: Mod assist;With upper extremity assist;From bed Stand to Sit: 3: Mod assist;With upper extremity assist;To chair/3-in-1;With armrests Stand Pivot Transfers: 3: Mod assist Details for Transfer Assistance: Needed cues for hand placement.  needed steadying assist as well for sit to stand and assist to control descent into chair.  Poor postural stability with posterior lean. Pt needed mod assist to weight  shift and take pivotal steps to recliner with HHA and pt reaching for armrests of chair.   Ambulation/Gait Ambulation/Gait Assistance: Not tested (comment) Stairs: No Wheelchair Mobility Wheelchair Mobility: No         PT Diagnosis: Acute pain;Generalized weakness  PT Problem List: Decreased activity tolerance;Decreased balance;Decreased mobility;Decreased knowledge of use of DME;Decreased safety awareness;Decreased knowledge of precautions;Pain PT Treatment Interventions: DME instruction;Gait training;Functional mobility training;Therapeutic activities;Therapeutic exercise;Balance training;Patient/family education     PT Goals(Current goals can be found in the care plan section) Acute Rehab PT Goals Patient Stated Goal: to go home PT Goal Formulation: With patient Time For Goal Achievement: 03/07/13 Potential to Achieve Goals: Good  Visit Information  Last PT Received On: 02/21/13 Assistance Needed: +2 History of Present Illness: Pt admit with malnutrition, AMS, and digitalis toxicity.  Afib as well.        Prior Functioning  Home Living Family/patient expects to be discharged to:: Private residence Living Arrangements: Spouse/significant other Available Help at Discharge: Family;Available 24 hours/day Type of Home: House Home Access: Level entry Home Layout: One level Home Equipment: Cane - single point;Bedside commode;Shower seat Additional Comments: Pt reports that her husband uses a  walker and she uses a cane.  Pt states that her husbaand can help her at d/c.   Prior Function Level of Independence: Independent with assistive device(s) Comments: Pt states she was independent with cane PTA Communication Communication: No difficulties    Cognition  Cognition Arousal/Alertness: Awake/alert Behavior During Therapy: WFL for tasks assessed/performed Overall Cognitive Status: Impaired/Different from baseline Area of Impairment: Orientation;Following  commands;Safety/judgement;Awareness;Problem solving Orientation Level: Disoriented to;Place;Time;Situation Memory: Decreased short-term memory Following Commands: Follows one step commands inconsistently;Follows one step commands with increased time Safety/Judgement: Decreased awareness of safety;Decreased awareness of deficits Problem Solving: Difficulty sequencing;Requires verbal cues General Comments: Pt with intermittent confusion    Extremity/Trunk Assessment Upper Extremity Assessment Upper Extremity Assessment: Defer to OT evaluation Lower Extremity Assessment Lower Extremity Assessment: Generalized weakness   Balance Balance Balance Assessed: Yes Static Standing Balance Static Standing - Balance Support: Right upper extremity supported;During functional activity Static Standing - Level of Assistance: 3: Mod assist Static Standing - Comment/# of Minutes: 2  End of Session PT - End of Session Equipment Utilized During Treatment: Gait belt Activity Tolerance: Patient limited by fatigue;Patient limited by pain Patient left: in chair;with call bell/phone within reach Nurse Communication: Mobility status       INGOLD,Dez Stauffer 02/21/2013, 1:46 PM [facility] [phone] (pager)

## 2013-02-21 NOTE — Progress Notes (Signed)
Subjective:  Patient complains of left arm hurting since IV infiltration, Slight skin tear noted.    Objective:  Vital Signs in the last 24 hours: Temp:  [97.5 F (36.4 C)-98.8 F (37.1 C)] 98.8 F (37.1 C) (10/22 0428) Pulse Rate:  [77-121] 121 (10/22 0649) Resp:  [18-20] 20 (10/22 0428) BP: (103-123)/(43-63) 111/63 mmHg (10/22 0428) SpO2:  [99 %-100 %] 99 % (10/22 0428)  Intake/Output from previous day: 10/21 0701 - 10/22 0700 In: 1595 [P.O.:320; I.V.:1275] Out: -   Physical Exam:   General appearance: alert, cooperative, appears stated age and no distress Eyes: negative findings: lids and lashes normal Neck: no adenopathy, no carotid bruit, no JVD, supple, symmetrical, trachea midline and thyroid not enlarged, symmetric, no tenderness/mass/nodules Neck: JVP - normal, carotids 2+= without bruits Resp: clear to auscultation bilaterally Chest wall: no tenderness Cardio: S1 is be able, S2 is normal there is no gallop or murmur appreciated. There is no rub. GI: soft, non-tender; bowel sounds normal; no masses,  no organomegaly Extremities: Trace ankle edema present. Full range of movements. Left forearm 3 plus edema and warm and skin sloughing size of a quarter noted. No signs of acute arterial insufficiency (dopamine infiltrate).    Lab Results:  Recent Labs  02/19/13 0500 02/21/13 0740  WBC 23.1* 17.0*  HGB 9.9* 8.5*  PLT 606* 526*    Recent Labs  02/19/13 0500 02/20/13 0715  NA 129* 132*  K 5.1 4.5  CL 96 101  CO2 17* 19  GLUCOSE 109* 91  BUN 34* 35*  CREATININE 2.12* 1.90*    Recent Labs  02/18/13 1534 02/19/13 0500  TROPONINI <0.30 <0.30   Hepatic Function Panel No results found for this basename: PROT, ALBUMIN, AST, ALT, ALKPHOS, BILITOT, BILIDIR, IBILI,  in the last 72 hours  Recent Labs  02/19/13 0500  CHOL 126   No results found for this basename: PROTIME,  in the last 72 hours Lipid Panel     Component Value Date/Time   CHOL 126  02/19/2013 0500   TRIG 70 02/19/2013 0500   HDL 46 02/19/2013 0500   CHOLHDL 2.7 02/19/2013 0500   VLDL 14 02/19/2013 0500   LDLCALC 66 02/19/2013 0500     Cardiac Studies:  EKG: 02/20/19 foot in: Junctional escape rhythm at a rate of 52 beats a minute, borderline low-voltage. No evidence of ischemia.  02/20/13: A. Fib with V rate 94/min. Non specific ST depression anterior leads, cannot R/O ischemia  Echo 02/19/13: Normal LVEF. Probable trace AS.    Assessment/Plan:  1. Digitalis toxicity, beta blocker toxicity in a patient presenting with acute renal failure. Now resolved and patient having episodes of A. FIb with RVR 2. Acute renal failure, improving. BMP pending 3. Severe diffuse myalgias, discontinued Lipitor for now. Patient reports improved symptoms 4. History of atrial fibrillation, chronic, history of pulmonary embolism on chronic Coumadin therapy. Now A. Fib with RVR.  5. Leukocytosis and thrombocytosis, suspect she may have myelodysplastic syndrome. Cultures uring and blood negative. 6. Severe anemia and hypoproteinemia  and malnutrition. 7. Subcutaneous dopamine infiltrate leading to chemical-induced cellulitis and a hemorrhagic bleb left arm.  Recommendation:  Patient is presently doing well and the myalgias have improved off of Lipitor, also she states that she is willing to go home. I will set up for wound care as a suspect that her left arm dopamine infiltrate wound will probably have a skin tear with ulceration as it is almost nearing rupture of the small bleb that  measures approximately 2 inches. I do not suspect any acute DVT, the arm is warm and there is no evidence of acute arterial insufficiency.  I have increased the dose of the Toprol to 50 mg by mouth twice a day. Pain I also be contributing to RVR. She probably has sick sinus syndrome. Patient needs physical therapy as she had complained of dizziness and generalized weakness and would be at risk for fall. Set  up for outpatient evaluation. I will also set up for outpatient wound care as a suspect she will develop an ulceration in her left arm.  Pamella Pert, M.D. 02/21/2013, 8:27 AM [facility] Pager: [phone] Office: [phone] If no answer: [phone]

## 2013-02-21 NOTE — Progress Notes (Signed)
Labs collected via foot stick as ordered by MD.  Lisa Nixon

## 2013-02-21 NOTE — Progress Notes (Signed)
Pt HR in the 120s-130s. Pt still in A-fib.  MD paged. New orders written and initiated.

## 2013-02-21 NOTE — Progress Notes (Signed)
Clinical Social Work Department BRIEF PSYCHOSOCIAL ASSESSMENT 02/21/2013  Patient:  Lisa Nixon, Lisa Nixon     Account Number:  [id number]     Admit date:  02/18/2013  Clinical Social Worker:  Carren Rang  Date/Time:  02/21/2013 02:56 PM  Referred by:  Care Management  Date Referred:  02/21/2013 Referred for  SNF Placement   Other Referral:   Interview type:  Other - See comment Other interview type:   CSW spoke to patient and family by bedside    PSYCHOSOCIAL DATA Living Status:  HUSBAND Admitted from facility:   Level of care:   Primary support name:  Lexy Meininger Primary support relationship to patient:  SPOUSE Degree of support available:   Good, Son Marijean Bravo [phone])    CURRENT CONCERNS Current Concerns  Post-Acute Placement   Other Concerns:    SOCIAL WORK ASSESSMENT / PLAN CSW received referral from CM that patient may need SNF at dc. CSW reviewed patient's chart and saw that PT recommended SNF. CSW went into patient's room and introduced self and explained reason for visit. Patient had several family members by bedside. CSW explained the SNF process to patient and family. Patient states she has been to [facility] in the past and that is her first choice. CSW will complete FL2 for MD signature.   Assessment/plan status:  Psychosocial Support/Ongoing Assessment of Needs Other assessment/ plan:   Information/referral to community resources:   SNF information/CSW contact information    PATIENT'S/FAMILY'S RESPONSE TO PLAN OF CARE: Patient and family are agreeable to SNF option.       Maree Krabbe, MSW, Theresia Majors [phone]

## 2013-02-22 DIAGNOSIS — D259 Leiomyoma of uterus, unspecified: Secondary | ICD-10-CM

## 2013-02-22 DIAGNOSIS — N179 Acute kidney failure, unspecified: Secondary | ICD-10-CM

## 2013-02-22 DIAGNOSIS — D473 Essential (hemorrhagic) thrombocythemia: Secondary | ICD-10-CM

## 2013-02-22 DIAGNOSIS — L03114 Cellulitis of left upper limb: Secondary | ICD-10-CM | POA: Diagnosis not present

## 2013-02-22 DIAGNOSIS — I498 Other specified cardiac arrhythmias: Principal | ICD-10-CM

## 2013-02-22 DIAGNOSIS — D649 Anemia, unspecified: Secondary | ICD-10-CM

## 2013-02-22 DIAGNOSIS — N9489 Other specified conditions associated with female genital organs and menstrual cycle: Secondary | ICD-10-CM

## 2013-02-22 DIAGNOSIS — Z86711 Personal history of pulmonary embolism: Secondary | ICD-10-CM

## 2013-02-22 DIAGNOSIS — IMO0001 Reserved for inherently not codable concepts without codable children: Secondary | ICD-10-CM

## 2013-02-22 DIAGNOSIS — E43 Unspecified severe protein-calorie malnutrition: Secondary | ICD-10-CM

## 2013-02-22 DIAGNOSIS — E119 Type 2 diabetes mellitus without complications: Secondary | ICD-10-CM

## 2013-02-22 DIAGNOSIS — T460X1A Poisoning by cardiac-stimulant glycosides and drugs of similar action, accidental (unintentional), initial encounter: Secondary | ICD-10-CM

## 2013-02-22 DIAGNOSIS — D72829 Elevated white blood cell count, unspecified: Secondary | ICD-10-CM

## 2013-02-22 DIAGNOSIS — I4891 Unspecified atrial fibrillation: Secondary | ICD-10-CM

## 2013-02-22 LAB — GLUCOSE, CAPILLARY
Glucose-Capillary: 102 mg/dL — ABNORMAL HIGH (ref 70–99)
Glucose-Capillary: 87 mg/dL (ref 70–99)
Glucose-Capillary: 103 mg/dL — ABNORMAL HIGH (ref 70–99)

## 2013-02-22 MED ORDER — DEXTROSE 5 % IV SOLN
100.0000 mg | Freq: Two times a day (BID) | INTRAVENOUS | Status: DC
  Administered 2013-02-22: 100 mg via INTRAVENOUS
  Filled 2013-02-22: qty 100

## 2013-02-22 MED ORDER — DOXYCYCLINE HYCLATE 100 MG PO TABS
100.0000 mg | ORAL_TABLET | Freq: Two times a day (BID) | ORAL | Status: DC
  Administered 2013-02-22 (×2): 100 mg via ORAL
  Filled 2013-02-22 (×4): qty 1

## 2013-02-22 MED ORDER — BOOST / RESOURCE BREEZE PO LIQD
1.0000 | Freq: Two times a day (BID) | ORAL | Status: DC
  Administered 2013-02-23 – 2013-02-26 (×6): 1 via ORAL

## 2013-02-22 MED ORDER — WARFARIN SODIUM 2 MG PO TABS
2.0000 mg | ORAL_TABLET | Freq: Once | ORAL | Status: AC
  Administered 2013-02-22: 2 mg via ORAL
  Filled 2013-02-22: qty 1

## 2013-02-22 NOTE — Progress Notes (Signed)
PHARMACY FOLLOW UP NOTE   Pharmacy Consult for : Coumadin  Indication: History of PE, atrial fibrillation   Dosing Weight: 51 kg  Labs:  Recent Labs  02/20/13 0715 02/21/13 0740 02/21/13 1145  HGB  --  8.5*  --   HCT  --  26.4*  --   PLT  --  526*  --   LABPROT 39.0*  --  30.5*  INR 4.22*  --  3.06*  CREATININE 1.90* 1.26*  --    Lab Results  Component Value Date   INR 3.06* 02/21/2013   INR 4.22* 02/20/2013   INR 3.46* 02/19/2013    Pertinent Medications:  Scheduled:  . insulin aspart  0-5 Units Subcutaneous QHS  . insulin aspart  0-9 Units Subcutaneous TID WC  . metoprolol tartrate  50 mg Oral BID  . pantoprazole  40 mg Oral Daily  . silver sulfADIAZINE   Topical BID  . vitamin B-12  1,000 mcg Oral Daily  . Warfarin - Pharmacist Dosing Inpatient   Does not apply q1800   Anti-infectives   Start     Dose/Rate Route Frequency Ordered Stop   02/18/13 2000  vancomycin (VANCOCIN) IVPB 750 mg/150 ml premix  Status:  Discontinued     750 mg 150 mL/hr over 60 Minutes Intravenous Every 48 hours 02/18/13 1840 02/19/13 0941   02/18/13 0945  cefTRIAXone (ROCEPHIN) 1 g in dextrose 5 % 50 mL IVPB  Status:  Discontinued     1 g 100 mL/hr over 30 Minutes Intravenous Every 24 hours 02/18/13 0933 02/20/13 0644      Assessment:  79 y/o female on chronic Coumadin for history of atrial fibrillation, PE.  INR, after admission, became SUPRAtherapeutic but has since fallen to 3.06.  Hgb trending down.  Last Hgb 8.5 but No bleeding complications identified.  Goal:  INR 2-3   Plan: 1. Coumadin 2 mg po today. 2. Daily INR's, CBC.  Monitor for bleeding complications.    Kaci Dillie, Deetta Perla.D 02/22/2013, 10:48 AM

## 2013-02-22 NOTE — Care Management Note (Unsigned)
    Page 1 of 1   02/23/2013     5:38:29 PM   CARE MANAGEMENT NOTE 02/23/2013  Patient:  Lisa Nixon, Lisa Nixon   Account Number:  [id number]  Date Initiated:  02/19/2013  Documentation initiated by:  Junius Creamer  Subjective/Objective Assessment:   adm w hyperkalemia     Action/Plan:   lives w husband, pcp dr Jacinto Halim   Anticipated DC Date:  02/23/2013   Anticipated DC Plan:  SKILLED NURSING FACILITY  In-house referral  Clinical Social Worker      DC Planning Services  CM consult      Choice offered to / List presented to:             Status of service:  In process, will continue to follow Medicare Important Message given?   (If response is "NO", the following Medicare IM given date fields will be blank) Date Medicare IM given:   Date Additional Medicare IM given:    Discharge Disposition:    Per UR Regulation:  Reviewed for med. necessity/level of care/duration of stay  If discussed at Long Length of Stay Meetings, dates discussed:    Comments:  02/23/13 Rivers Gassmann,RN,BSN [phone] MD ASKED TO LOOK INTO POSSIBLE LTAC FOR PT.  UNFORTUNATELY, PT'S INSURANCE REQUIRES THAT PT BE ON V[place] OR [place] TO BE ELIGIBLE FOR LTAC.  WILL PROCEED WITH SNF AS ORIGINALLY PLANNED AND MD AGREEABLE WITH THIS.  02/22/13 Tricha Ruggirello,RN,BSN [phone] P.T. RECOMMENDING SNF AT DC, AND PT AGREEABLE.  CSW FOLLOWING TO FACILITATE DC TO SNF WHEN MEDICALLY STABLE FOR DC.  FAMILY MEMBERS PROVIDED WITH HH LIST FOR [facility] AT THEIR REQUEST, AS PT'S HUSBAND MAY NEED FOLLOW UP HH CARE WHILE WIFE IS AT SNF.  LIST PROVIDED--INSTRUCTED THEM TO CALL HUSBAND'S PCP FOR SET UP.

## 2013-02-22 NOTE — Progress Notes (Addendum)
NUTRITION CONSULT/FOLLOW UP  DOCUMENTATION CODES Per approved criteria  -Severe malnutrition in the context of chronic illness   Intervention:    Resource Breeze twice daily (250 kcals, 9 gm protein per 8 fl oz carton) RD to follow for nutrition care plan  Nutrition Dx:   Inadequate oral intake, ongoing  Goal:   Pt to meet >/= 90% of their estimated nutrition needs, unmet  Monitor:   PO & supplemental intake, weight, labs, I/O's  Assessment:   Pt admitted with AMS, found down at home. Pt now with generalized body aches and frequent urination.   Initial nutrition assessment completed 10/20.  CWOCN note reviewed for left arm wound.  PO intake continues to be poor at 25% per flowsheet records.  RD to order [employer] (clear liquid supplement) given nausea.  Disposition: SNF placement.  Height: Ht Readings from Last 1 Encounters:  No data found for Ht    Weight Status:   Wt Readings from Last 1 Encounters:  02/22/13 113 lb 5.1 oz (51.4 kg)    Re-estimated needs:  Kcal: 1300-1500 Protein: 60-70 gm Fluid: >/= 1.5 L  Skin: Intact  Diet Order: Dysphagia 3, thin liquids   Intake/Output Summary (Last 24 hours) at 02/22/13 1502 Last data filed at 02/22/13 1440  Gross per 24 hour  Intake    360 ml  Output      0 ml  Net    360 ml    Labs:   Recent Labs Lab 02/19/13 0500 02/20/13 0715 02/21/13 0740  NA 129* 132* 132*  K 5.1 4.5 4.1  CL 96 101 101  CO2 17* 19 17*  BUN 34* 35* 29*  CREATININE 2.12* 1.90* 1.26*  CALCIUM 9.3 8.0* 7.9*  GLUCOSE 109* 91 80    CBG (last 3)   Recent Labs  02/21/13 2139 02/22/13 0605 02/22/13 1102  GLUCAP 117* 102* 122*    Scheduled Meds: . doxycycline  100 mg Oral Q12H  . insulin aspart  0-5 Units Subcutaneous QHS  . insulin aspart  0-9 Units Subcutaneous TID WC  . metoprolol tartrate  50 mg Oral BID  . pantoprazole  40 mg Oral Daily  . silver sulfADIAZINE   Topical BID  . vitamin B-12  1,000 mcg Oral Daily   . warfarin  2 mg Oral ONCE-1800  . Warfarin - Pharmacist Dosing Inpatient   Does not apply q1800    Continuous Infusions:   Maureen Chatters, RD, LDN Pager #: [phone] After-Hours Pager #: [phone]

## 2013-02-22 NOTE — Progress Notes (Signed)
I have discussed with the hospitalist, patient's cardiac status is stable, atrial fibrillation is rate controlled, she is already on Coumadin, I advised him to take over her care as patient is unable to stand up, has gait difficulty, protein calorie malnutrition, patient is not safe to return home prior to further evaluation.  She also has chemical induced cellulitis of her left arm with sloughing off her skin.  She will need further wound care.  They have gracefully accepted the patient.  I will be available when necessary.

## 2013-02-22 NOTE — Progress Notes (Addendum)
[facility]                                                                                Patient Demographics  Lisa Nixon, is a 79 y.o. female, DOB - December 10, 1922, ZOX:[id number]  Admit date - 02/18/2013   Admitting Physician Pamella Pert, MD  Outpatient Primary MD for the patient is Pamella Pert, MD  LOS - 4   Chief Complaint  Patient presents with  . Bradycardia  . 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  Continue warfarin, metoprolol. -Currently therapeutic INR 3  History of Pulmonary Embolism -On coumadin and therapeutic  Myalgia -Statin discontinued.  Improving.  Malnutrition with hypoproteinemia -Will have nutrition consulted.  Leukocytosis  -Trending downward, will start patient on doxycycline due to cellulitis  Thrombocytosis -Will obtain ferritin level and blood smear, along with iron studies.    Anemia -Currently at baseline.  No active signs of bleeding.  Will continue to monitor CBC.  Acute renal failure -Improving, Cr 1.26.  Will continue to monitor BMP.  Nausea -Continue Zofran PRN  Digitalis toxicity -Followed and corrected by cardiology.  Diabetes mellitus Continue CBG monitoring and ISS.    Uterine fibroid -Found on CT scan of Abd/pelvis.  Malignancy could not be excluded. -Will order ultrasound.   Code Status:  Full  Family Communication: None  Disposition Plan: Admitted   Procedures  Echocardiogram   Consults  Cardiology  DVT Prophylaxis  Coumadin  Lab Results  Component Value Date   PLT 526* 02/21/2013    Medications  Scheduled Meds: . insulin aspart  0-5 Units Subcutaneous QHS  . insulin aspart  0-9 Units Subcutaneous TID WC  . metoprolol tartrate  50 mg Oral BID  . pantoprazole  40 mg Oral Daily  . silver sulfADIAZINE   Topical BID  . vitamin B-12  1,000 mcg Oral Daily  . warfarin  2 mg Oral ONCE-1800  . Warfarin - Pharmacist Dosing Inpatient   Does not apply q1800   Continuous Infusions:  PRN Meds:.acetaminophen, fentaNYL, lidocaine-prilocaine, ondansetron (ZOFRAN) IV  Antibiotics    Anti-infectives   Start     Dose/Rate Route Frequency Ordered Stop   02/18/13 2000  vancomycin (VANCOCIN) IVPB 750 mg/150 ml premix  Status:  Discontinued     750 mg 150 mL/hr over 60 Minutes Intravenous Every 48 hours 02/18/13 1840 02/19/13 0941   02/18/13 0945  cefTRIAXone (ROCEPHIN) 1 g in dextrose 5 % 50 mL IVPB  Status:  Discontinued     1 g 100 mL/hr over 30 Minutes Intravenous Every 24 hours 02/18/13 0933  02/20/13 0644       Time Spent in minutes   35 minutes   Cesiah Westley D.O. on 02/22/2013 at 1:15 PM  Between 7am to 7pm - Pager - [phone]  After 7pm go to [email or web address] - password [email or web address]  And look for the night coverage person covering for me after hours  [facility] Office  [phone]    Subjective:   Kyndra Condron seen and examined today.  Patient complains of some nausea, left arm pain, and weakness. Patient denies dizziness, chest pain, shortness of breath, abdominal pain, N/V/D/C, new weakness, numbess, tingling.    Objective:   Filed Vitals:   02/21/13 1306 02/21/13 2108 02/22/13 0352 02/22/13 1006  BP: 98/57 147/80 118/71 120/78  Pulse: 98 111 112 110  Temp: 98.1 F (36.7 C) 98.8 F (37.1 C) 97.5 F (36.4 C) 97.2 F (36.2 C)   TempSrc: Oral Oral Oral Oral  Resp: 18  18 20   Weight:    51.4 kg (113 lb 5.1 oz)  SpO2: 100% 98% 99% 98%    Wt Readings from Last 3 Encounters:  02/22/13 51.4 kg (113 lb 5.1 oz)     Intake/Output Summary (Last 24 hours) at 02/22/13 1315 Last data filed at 02/22/13 0830  Gross per 24 hour  Intake    240 ml  Output      0 ml  Net    240 ml    Exam  General: Well developed, well nourished, NAD, appears stated age  HEENT: NCAT, PERRLA, EOMI, Anicteic Sclera, mucous membranes moist.   Neck: Supple, no JVD, no masses  Cardiovascular: S1 S2 auscultated, irregularly irregular  Respiratory: Clear to auscultation bilaterally with equal chest rise  Abdomen: Soft, obese, non-tender to palpation, + bowel sounds  Extremities: warm dry without cyanosis clubbing. Trace edema in lower extremities bilaterally. Left forearm has 2+ pitting edema with slight skin sloughing.  Neuro: AAOx3, cranial nerves grossly intact.   Skin: Without rashes exudates or nodules, otherwise noted above.  Psych: Normal affect and demeanor with intact judgement and insight    Data Review   Micro Results Recent Results (from the past 240 hour(s))  MRSA PCR SCREENING     Status: None   Collection Time    02/18/13 11:10 AM      Result Value Range Status   MRSA by PCR NEGATIVE  NEGATIVE Final   Comment:            The GeneXpert MRSA Assay (FDA     approved for NASAL specimens     only), is one component of a     comprehensive MRSA colonization     surveillance program. It is not     intended to diagnose MRSA     infection nor to guide or     monitor treatment for     MRSA infections.  CULTURE, BLOOD (ROUTINE X 2)     Status: None   Collection Time    02/18/13  4:30 PM      Result Value Range Status   Specimen Description BLOOD LEFT HAND   Final   Special Requests BOTTLES DRAWN AEROBIC ONLY 8CC   Final   Culture  Setup Time     Final   Value: 02/19/2013 00:24     Performed at [facility]   Culture     Final   Value:        BLOOD CULTURE RECEIVED NO GROWTH TO DATE CULTURE WILL BE HELD  FOR 5 DAYS BEFORE ISSUING A FINAL NEGATIVE REPORT     Performed at [facility]   Report Status PENDING   Incomplete  CULTURE, BLOOD (ROUTINE X 2)     Status: None   Collection Time    02/18/13  4:50 PM      Result Value Range Status   Specimen Description BLOOD RIGHT HAND   Final   Special Requests BOTTLES DRAWN AEROBIC ONLY 2CC   Final   Culture  Setup Time     Final   Value: 02/19/2013 00:24     Performed at [facility]   Culture     Final   Value:        BLOOD CULTURE RECEIVED NO GROWTH TO DATE CULTURE WILL BE HELD FOR 5 DAYS BEFORE ISSUING A FINAL NEGATIVE REPORT     Performed at [facility]   Report Status PENDING   Incomplete  URINE CULTURE     Status: None   Collection Time    02/19/13  9:29 AM      Result Value Range Status   Specimen Description URINE, RANDOM   Final   Special Requests NONE   Final   Culture  Setup Time     Final   Value: 02/19/2013 10:13     Performed at [facility] Count     Final   Value: NO GROWTH     Performed at [facility]   Culture     Final   Value: NO GROWTH     Performed at [facility]   Report Status 02/20/2013 FINAL   Final    Radiology Reports Ct Abdomen Pelvis W Contrast  02/15/2013   CLINICAL DATA:  Left lower quadrant abdominal pain and tenderness, worse over the last 2 days, history of prior surgery for small bowel obstruction, history of right breast carcinoma in 2008  EXAM: CT ABDOMEN AND PELVIS WITH CONTRAST  TECHNIQUE: Multidetector CT imaging of the abdomen and pelvis was performed using the standard protocol following bolus administration of intravenous contrast.  CONTRAST:  OMNIPAQUE IOHEXOL 300 MG/ML  SOLN  COMPARISON:  CT abdomen pelvis of 05/24/2008  FINDINGS: The lung bases are clear other than linear scarring in the periphery of the right lower lobe.  The liver enhances with no focal abnormality and no ductal dilatation is seen. No calcified gallstones are noted. The pancreas appears somewhat atrophic with minimal prominence of the pancreatic duct. The adrenal glands and spleen are unremarkable. The configuration of the gastroesophageal junction is atypical which possibly is due to prior surgery and gastric fundoplication the kidneys enhance with no calculus or mass and the pelvocaliceal systems are unremarkable on delayed images. The abdominal aorta is normal in caliber with moderate atheromatous change present. No adenopathy is seen. A tiny midline ventral hernia is present containing only a small amount of fat just above the umbilicus.  In this elderly patient the uterus is lobular and enlarged probably due to a large degenerating uterine fibroid. A uterine malignancy would be difficult to exclude. A pelvic ultrasound may be helpful to assess further. Also there is a cystic structure anteriorly in the left pelvis of approximately 2.6 cm in diameter. Ultrasound may also be helpful in assessing this possible adnexal cyst. No free fluid is seen within the pelvis. The urinary bladder is unremarkable. Both large and small bowel gas is present without obstruction. The appendix and terminal ileum are unremarkable. There is  some exaggeration of lumbar lordosis. An old partial compression deformity of T12 is noted with degenerative change. Also there is slight anterolisthesis of L4 on L5 by 4 mm and L5 on S1 by 5 mm most likely degenerative in origin.  IMPRESSION: 1. Abnormal appearance of the uterus may be due to a large degenerating uterine fibroid, but a malignancy would be difficult to exclude. Correlate clinically and consider ultrasound or MRI to assess further. Also question 2.6 cm anterior left adnexal cyst. 2. Abnormality of the gastric fundus may be due to prior gastric fundoplication. Correlate clinically. 3. Slight anterolisthesis of L4 on L5 and L5 on S1  most likely degenerative in origin. 4. The appendix and terminal ileum appear normal.   Electronically Signed   By: Dwyane Dee M.D.   On: 02/15/2013 15:20   Dg Chest Port 1 View  02/18/2013   CLINICAL DATA:  Chest pain  EXAM: PORTABLE CHEST - 1 VIEW  COMPARISON:  05/22/2007  FINDINGS: Chronic cardiopericardial enlargement. Aortic atherosclerosis. Chronic interstitial coarsening and hyperinflation. No edema, effusion, or infiltrate. Mottled lucency overlapping the left shoulder is likely gas trapped underneath a pad. No evidence of pneumothorax.  IMPRESSION: Cardiomegaly and COPD.  No evidence of active disease.   Electronically Signed   By: Tiburcio Pea M.D.   On: 02/18/2013 06:27    CBC  Recent Labs Lab 02/18/13 0430 02/18/13 0442 02/18/13 1701 02/19/13 0500 02/21/13 0740  WBC 16.2*  --  22.3* 23.1* 17.0*  HGB 9.2* 9.9* 10.1* 9.9* 8.5*  HCT 28.6* 29.0* 30.4* 30.3* 26.4*  PLT 594*  --  678* 606* 526*  MCV 90.5  --  89.1 88.1 88.6  MCH 29.1  --  29.6 28.8 28.5  MCHC 32.2  --  33.2 32.7 32.2  RDW 16.4*  --  16.1* 16.3* 16.6*  LYMPHSABS 2.7  --  1.5  --   --   MONOABS 1.9*  --  3.1*  --   --   EOSABS 0.0  --  0.0  --   --   BASOSABS 0.0  --  0.0  --   --     Chemistries   Recent Labs Lab 02/18/13 0430 02/18/13 0442 02/19/13 0500 02/20/13 0715 02/21/13 0740  NA 130* 131* 129* 132* 132*  K 6.0* 5.4* 5.1 4.5 4.1  CL 93* 102 96 101 101  CO2 17*  --  17* 19 17*  GLUCOSE 238* 234* 109* 91 80  BUN 29* 28* 34* 35* 29*  CREATININE 2.19* 2.50* 2.12* 1.90* 1.26*  CALCIUM 11.4*  --  9.3 8.0* 7.9*  AST 31  --   --   --   --   ALT 15  --   --   --   --   ALKPHOS 66  --   --   --   --   BILITOT 0.4  --   --   --   --    ------------------------------------------------------------------------------------------------------------------ CrCl is unknown because there is no height on file for the current  visit. ------------------------------------------------------------------------------------------------------------------ No results found for this basename: HGBA1C,  in the last 72 hours ------------------------------------------------------------------------------------------------------------------ No results found for this basename: CHOL, HDL, LDLCALC, TRIG, CHOLHDL, LDLDIRECT,  in the last 72 hours ------------------------------------------------------------------------------------------------------------------ No results found for this basename: TSH, T4TOTAL, FREET3, T3FREE, THYROIDAB,  in the last 72 hours ------------------------------------------------------------------------------------------------------------------ No results found for this basename: VITAMINB12, FOLATE, FERRITIN, TIBC, IRON, RETICCTPCT,  in the last 72 hours  Coagulation profile  Recent Labs Lab  02/18/13 0602 02/18/13 1701 02/19/13 0500 02/20/13 0715 02/21/13 1145  INR 2.36* 2.71* 3.46* 4.22* 3.06*    No results found for this basename: DDIMER,  in the last 72 hours  Cardiac Enzymes  Recent Labs Lab 02/18/13 1534 02/19/13 0500  TROPONINI <0.30 <0.30   ------------------------------------------------------------------------------------------------------------------ No components found with this basename: POCBNP,

## 2013-02-22 NOTE — Progress Notes (Signed)
Started IV Doxycycline, pt complained of burning and pain at IV site. DC'd IV and called IV team for restart. Will alternate heat and ice on site and continue to monitor. Pt being switched to PO Doxy   Kerry Dory

## 2013-02-22 NOTE — Progress Notes (Signed)
CSW spoke with [facility] (patient's preference). Admissions stated that they are waiting on insurance authorization and "it should not be a problem". CSW will follow up with facility tomorrow morning and will update patient and family.  Maree Krabbe, MSW, [facility]ors [phone]

## 2013-02-23 DIAGNOSIS — N179 Acute kidney failure, unspecified: Secondary | ICD-10-CM

## 2013-02-23 DIAGNOSIS — D649 Anemia, unspecified: Secondary | ICD-10-CM

## 2013-02-23 LAB — CBC
HCT: 28.3 % — ABNORMAL LOW (ref 36.0–46.0)
Hemoglobin: 9 g/dL — ABNORMAL LOW (ref 12.0–15.0)
MCH: 28.3 pg (ref 26.0–34.0)
MCHC: 31.8 g/dL (ref 30.0–36.0)
MCV: 89 fL (ref 78.0–100.0)
Platelets: 624 10*3/uL — ABNORMAL HIGH (ref 150–400)
RDW: 16.5 % — ABNORMAL HIGH (ref 11.5–15.5)

## 2013-02-23 LAB — BASIC METABOLIC PANEL
BUN: 18 mg/dL (ref 6–23)
CO2: 18 mEq/L — ABNORMAL LOW (ref 19–32)
Chloride: 103 mEq/L (ref 96–112)
Creatinine, Ser: 0.84 mg/dL (ref 0.50–1.10)
GFR calc Af Amer: 70 mL/min — ABNORMAL LOW (ref >90)
GFR calc non Af Amer: 61 mL/min — ABNORMAL LOW (ref >90)
Potassium: 3.6 mEq/L (ref 3.5–5.1)
Sodium: 136 mEq/L (ref 135–145)

## 2013-02-23 LAB — IRON AND TIBC
Iron: <10 ug/dL — ABNORMAL LOW (ref 42–135)
UIBC: 104 ug/dL — ABNORMAL LOW (ref 125–400)

## 2013-02-23 LAB — TRANSFERRIN: Transferrin: <100 mg/dL — ABNORMAL LOW (ref 200–360)

## 2013-02-23 LAB — GLUCOSE, CAPILLARY
Glucose-Capillary: 97 mg/dL (ref 70–99)
Glucose-Capillary: 155 mg/dL — ABNORMAL HIGH (ref 70–99)

## 2013-02-23 LAB — PROTIME-INR: Prothrombin Time: 24.2 seconds — ABNORMAL HIGH (ref 11.6–15.2)

## 2013-02-23 LAB — PATHOLOGIST SMEAR REVIEW

## 2013-02-23 MED ORDER — WARFARIN SODIUM 5 MG PO TABS: 5.0000 mg | ORAL_TABLET | ORAL | Status: DC

## 2013-02-23 MED ORDER — WARFARIN SODIUM 2.5 MG PO TABS
2.5000 mg | ORAL_TABLET | ORAL | Status: DC
  Administered 2013-02-23 – 2013-02-24 (×2): 2.5 mg via ORAL
  Filled 2013-02-23 (×4): qty 1

## 2013-02-23 MED ORDER — DOXYCYCLINE HYCLATE 100 MG IV SOLR
100.0000 mg | Freq: Two times a day (BID) | INTRAVENOUS | Status: DC
  Administered 2013-02-23 – 2013-02-26 (×6): 100 mg via INTRAVENOUS
  Filled 2013-02-23 (×9): qty 100

## 2013-02-23 MED ORDER — DILTIAZEM HCL 60 MG PO TABS
120.0000 mg | ORAL_TABLET | Freq: Two times a day (BID) | ORAL | Status: DC
  Administered 2013-02-23 – 2013-02-26 (×7): 120 mg via ORAL
  Filled 2013-02-23 (×8): qty 2

## 2013-02-23 MED ORDER — FERROUS SULFATE 325 (65 FE) MG PO TABS
325.0000 mg | ORAL_TABLET | Freq: Three times a day (TID) | ORAL | Status: DC
  Administered 2013-02-23 – 2013-02-26 (×10): 325 mg via ORAL
  Filled 2013-02-23 (×12): qty 1

## 2013-02-23 NOTE — Progress Notes (Signed)
Pt with Afib 120's-130's, bouncing as high as 165.  Hospitalist aware and paging cardiologist.  Pt endorses no chest pain but some SOB with activity.  Resting comfortably in bed at this time, will con't to monitor.

## 2013-02-23 NOTE — Progress Notes (Signed)
Subjective:  Patient's left arm pain is significantly improved. Swelling also is reduced. No other specific complaints. I was asked to see the patient as patient had episodes of paroxysmal atrial fibrillation with rapid ventricular response.  Objective:  Vital Signs in the last 24 hours: Temp:  [98 F (36.7 C)-99.2 F (37.3 C)] 98 F (36.7 C) (10/24 1421) Pulse Rate:  [67-105] 67 (10/24 1421) Resp:  [16-20] 18 (10/24 1421) BP: (120-146)/(73-85) 120/73 mmHg (10/24 1421) SpO2:  [96 %-100 %] 96 % (10/24 1421)  Intake/Output from previous day: 10/23 0701 - 10/24 0700 In: 360 [P.O.:360] Out: -   Physical Exam:   General appearance: alert, cooperative, appears stated age and no distress Eyes: negative findings: lids and lashes normal Neck: no adenopathy, no carotid bruit, no JVD, supple, symmetrical, trachea midline and thyroid not enlarged, symmetric, no tenderness/mass/nodules Neck: JVP - normal, carotids 2+= without bruits Resp: clear to auscultation bilaterally Chest wall: no tenderness Cardio: S1 is variable, S2 is normal there is no gallop or murmur appreciated. There is no rub. GI: soft, non-tender; bowel sounds normal; no masses,  no organomegaly Extremities: Trace ankle edema present. Full range of movements. Left forearm 3 plus edema and warm and skin sloughing size of a quarter noted. No signs of acute arterial insufficiency (dopamine infiltrate).    Lab Results:  Recent Labs  02/21/13 0740 02/23/13 0545  WBC 17.0* 14.8*  HGB 8.5* 9.0*  PLT 526* 624*    Recent Labs  02/21/13 0740 02/23/13 0545  NA 132* 136  K 4.1 3.6  CL 101 103  CO2 17* 18*  GLUCOSE 80 93  BUN 29* 18  CREATININE 1.26* 0.84   Lipid Panel     Component Value Date/Time   CHOL 126 02/19/2013 0500   TRIG 70 02/19/2013 0500   HDL 46 02/19/2013 0500   CHOLHDL 2.7 02/19/2013 0500   VLDL 14 02/19/2013 0500   LDLCALC 66 02/19/2013 0500     Cardiac Studies:  EKG: 02/20/19 foot in:  Junctional escape rhythm at a rate of 52 beats a minute, borderline low-voltage. No evidence of ischemia.  02/20/13: A. Fib with V rate 94/min. Non specific ST depression anterior leads, cannot R/O ischemia  Echo 02/19/13: Normal LVEF. Probable trace AS.  Scheduled Meds: . diltiazem  120 mg Oral Q12H  . doxycycline (VIBRAMYCIN) IV  100 mg Intravenous Q12H  . feeding supplement (RESOURCE BREEZE)  1 Container Oral BID BM  . ferrous sulfate  325 mg Oral TID WC  . insulin aspart  0-5 Units Subcutaneous QHS  . insulin aspart  0-9 Units Subcutaneous TID WC  . metoprolol tartrate  50 mg Oral BID  . pantoprazole  40 mg Oral Daily  . silver sulfADIAZINE   Topical BID  . vitamin B-12  1,000 mcg Oral Daily  . warfarin  2.5 mg Oral Custom  . [START ON 02/26/2013] warfarin  5 mg Oral Custom  . Warfarin - Pharmacist Dosing Inpatient   Does not apply q1800   Continuous Infusions:  PRN Meds:.acetaminophen, fentaNYL, lidocaine-prilocaine, ondansetron (ZOFRAN) IV   Assessment/Plan:  1. Digitalis toxicity, beta blocker toxicity in a patient presenting with acute renal failure. Now resolved and patient having episodes of A. FIb with RVR  Recommendation: Patient was started on Cardizem CD 120 mg daily. With this the heart rate is nicely come down and the whole trend through the day has been less than 100 beats per minute, hence we'll continue the same. If she has significant  bradycardia, would recommend reducing the dose of metoprolol to 20 from the gram by mouth twice a day as a suspect patient will need dual negative chronotropic agents to control her heart rate. Do not start digoxin. Pamella Pert, M.D. 02/23/2013, 6:23 PM [facility] Pager: [phone] Office: [phone] If no answer: [phone]

## 2013-02-23 NOTE — Progress Notes (Signed)
ANTICOAGULATION CONSULT NOTE - Follow Up Consult  Pharmacy Consult for Coumadin Indication: atrial fibrillation  No Known Allergies  Patient Measurements: Weight: 113 lb 5.1 oz (51.4 kg) Heparin Dosing Weight:    Vital Signs: Temp: 98.5 F (36.9 C) (10/24 0506) Temp src: Oral (10/24 0506) BP: 135/81 mmHg (10/24 0506) Pulse Rate: 103 (10/24 0506)  Labs:  Recent Labs  02/21/13 0740 02/21/13 1145 02/23/13 0545  HGB 8.5*  --  9.0*  HCT 26.4*  --  28.3*  PLT 526*  --  624*  LABPROT  --  30.5* 24.2*  INR  --  3.06* 2.26*  CREATININE 1.26*  --  0.84    CrCl is unknown because there is no height on file for the current visit.   Assessment: 79 y.o. female admitted 02/18/2013 with cardiac arrest.  PMH: Afib, hyperlipidemia, GERD, HTN, Breast CA, CAD  AC: Coumadin PTA for afib, h/o PE.(PTA Warfarin: 5mg  on Mon/Wed and 2.5mg  TTFSS with admit INR 2.36). INR 2.26 this am.  blood-ngtd   Goal of Therapy:  INR 2-3 Monitor platelets by anticoagulation protocol: Yes   Plan:  Coumadin 5mg  M/W and 2.5mg  TTSFF Daily INR  Merilynn Finland, [employer] 02/23/2013,8:37 AM

## 2013-02-23 NOTE — Consult Note (Signed)
WOC follow-up: Requested to re-assess left arm wound.  Refer to progress note on 10/22 for assessment and plan of care.  Left arm remains extremely tight and swollen, extends from above elbow to entire hand.  Left inner elbow with previous blister has de-roofed and evolved into a full thickness wound 4.5X5cmX.1cm. 90% beefy red, 10% black area.  Hard raised area underneath and surrounding wound.  Site is still high risk to evolve into nonviable tissue R/T previous infiltrate.  Recommend follow-up at outpatient wound care center after discharge for continued assessment and plan of care. This wound is beyond [facility] scope of practice R/T extensive swelling and possible tissue damage. Discussed with primary team. Pt could benefit from assessment by VVS or Hand team.    Continue present plan of care as follows to assist with removal of nonviable tissue: Apply Silvadene cream to left arm Q day.  Cover with non-adherent dressing and secure with kerlex.  Gently wipe off previous Silvadene with moist gauze before applying more every day.  Please re-consult if further assistance is needed.  Thank-you,  Cammie Mcgee MSN, RN, CWOCN, [place], CNS [phone]

## 2013-02-23 NOTE — Consult Note (Signed)
ORTHOPAEDIC CONSULTATION HISTORY & PHYSICAL REQUESTING PHYSICIAN: Leroy Sea, MD  Chief Complaint: left forearm wound  HPI: Lisa Nixon is a 79 y.o. female who experienced a PIV infiltration containing DA, now with resultant ST swelling and blister  Past Medical History  Diagnosis Date  . Atrial fibrillation   . Hyperlipemia   . GERD (gastroesophageal reflux disease)   . Hypertension   . Breast cancer   . Angina at rest    Past Surgical History  Procedure Laterality Date  . Breast surgery    . Lymphadenectomy    . Hiatal hernia repair    . Cesarean section     History   Social History  . Marital Status: Married    Spouse Name: N/A    Number of Children: N/A  . Years of Education: N/A   Social History Main Topics  . Smoking status: Never Smoker   . Smokeless tobacco: None  . Alcohol Use: No  . Drug Use: No  . Sexual Activity: None   Other Topics Concern  . None   Social History Narrative  . None   History reviewed. No pertinent family history. No Known Allergies Prior to Admission medications   Medication Sig Start Date End Date Taking? Authorizing Provider  atorvastatin (LIPITOR) 20 MG tablet Take 20 mg by mouth daily.   Yes Historical Provider, MD  digoxin (LANOXIN) 0.125 MG tablet Take 0.125 mg by mouth daily.   Yes Historical Provider, MD  diltiazem (TIAZAC) 300 MG 24 hr capsule Take 300 mg by mouth daily.   Yes Historical Provider, MD  Fe Fum-FePoly-Vit C-Vit B3 (INTEGRA PO) Take 1 capsule by mouth daily.   Yes Historical Provider, MD  lisinopril (PRINIVIL,ZESTRIL) 20 MG tablet Take 20 mg by mouth daily.   Yes Historical Provider, MD  mirtazapine (REMERON) 15 MG tablet Take 15 mg by mouth at bedtime.   Yes Historical Provider, MD  oxyCODONE-acetaminophen (PERCOCET) 7.5-325 MG per tablet Take 1 tablet by mouth every 8 (eight) hours as needed for pain.   Yes Historical Provider, MD  pantoprazole (PROTONIX) 40 MG tablet Take 40 mg by mouth daily.    Yes Historical Provider, MD  potassium chloride SA (K-DUR,KLOR-CON) 20 MEQ tablet Take 20 mEq by mouth daily.   Yes Historical Provider, MD  propranolol (INDERAL) 40 MG tablet Take 40 mg by mouth 2 (two) times daily.   Yes Historical Provider, MD  vitamin B-12 (CYANOCOBALAMIN) 1000 MCG tablet Take 1,000 mcg by mouth daily.   Yes Historical Provider, MD  warfarin (COUMADIN) 5 MG tablet Take 2.5-5 mg by mouth daily. Takes 5mg  on Mondays and Wednesdays and takes 2.5mg  on all other days   Yes Historical Provider, MD   No results found.  Positive ROS: All other systems have been reviewed and were otherwise negative with the exception of those mentioned in the HPI and as above.  Physical Exam: Vitals: Refer to EMR. Constitutional:  WD, WN, NAD HEENT:  NCAT, EOMI Neuro/Psych:  Alert & oriented to person, place, and time; appropriate mood & affect Lymphatic: No generalized UE edema or lymphadenopathy Extremities / MSK:  The extremities are normal with respect to appearance, ranges of motion, joint stability, muscle strength/tone, sensation, & perfusion except as otherwise noted:   L FA/hand a little swollen, 5-6 cm blister proximal/anterior surface.  It has ruptured, has silvadene dressing on it.  Base appears to be red, injected dermis.  Does not appear to be full-thickness necrosis. NVI, swelling not sufficient  for vascular compromise  Assessment: ST blister/wound following PIV infiltration  Plan: No surgery likely to be required.  Recommend local wound care with f/u at wound center or PCP for resolution.  Cliffton Asters Janee Morn, MD     Mobile [phone] Orthopaedic & Hand Surgery [facility] [address] [place], [place]  [address] [phone]

## 2013-02-23 NOTE — Progress Notes (Signed)
[facility]                                                                                Patient Demographics  Lisa Nixon, is a 79 y.o. female, DOB - 28-Nov-1922, HQI:[id number]  Admit date - 02/18/2013   Admitting Physician Lisa Pert, MD  Outpatient Primary MD for the patient is Lisa Pert, MD  LOS - 5   Chief Complaint  Patient presents with  . Bradycardia  . Loss of Consciousness        Assessment & Plan    Left Arm Cellulitis, chemically induced from dopamine infiltration  -Wound care following, have requested hand Surgery to evaluate -Patient has leukocytosis, however trending downward, on Doxycycline IV      Atrial fibrillation with RVR  -May be secondary to pain. Continue warfarin, metoprolol., D/W Dr Jacinto Halim added Cardiazem he will see her again. -Currently therapeutic INR      History of Pulmonary Embolism  -On coumadin and therapeutic   Lab Results  Component Value Date   INR 2.26* 02/23/2013   INR 3.06* 02/21/2013   INR 4.22* 02/20/2013      Myalgia  -Statin discontinued. Improving.     Malnutrition with hypoproteinemia  -Will have nutrition consulted.     Leukocytosis  -Trending downward, will start patient on doxycycline due to cellulitis     Thrombocytosis  -reactionary- monitor     Anemia  -Currently at baseline. No active signs of bleeding. Iron deficient, replace orally.     Acute renal failure  -Improving, Cr 1.26. Will continue to monitor BMP.     Digitalis toxicity  -Followed and corrected by cardiology.     Diabetes mellitus  Continue CBG monitoring and ISS.   CBG (last 3)   Recent Labs  02/22/13 1613 02/22/13 2053 02/23/13 0616  GLUCAP 87 103* 97     Lab Results  Component Value Date   HGBA1C 6.0* 02/18/2013       Uterine fibroid  -Found on CT scan of Abd/pelvis. Outpt OB follow up.      Code Status: Full  Family Communication:    Disposition Plan:  SNF   Procedures     Consults  Cards, Hand Surgery   Medications  Scheduled Meds: . diltiazem  120 mg Oral Q12H  . doxycycline (VIBRAMYCIN) IV  100 mg Intravenous Q12H  . feeding supplement (RESOURCE BREEZE)  1 Container Oral BID BM  . insulin aspart  0-5 Units Subcutaneous QHS  . insulin aspart  0-9 Units Subcutaneous TID WC  . metoprolol tartrate  50 mg Oral BID  . pantoprazole  40 mg Oral Daily  . silver sulfADIAZINE   Topical BID  . vitamin B-12  1,000 mcg Oral Daily  . warfarin  2.5 mg Oral Custom  . [START ON 02/26/2013] warfarin  5 mg Oral Custom  . Warfarin - Pharmacist Dosing Inpatient   Does not apply q1800   Continuous Infusions:  PRN Meds:.acetaminophen, fentaNYL, lidocaine-prilocaine, ondansetron (ZOFRAN) IV  DVT Prophylaxis  Coumadin  Lab Results  Component Value Date   INR 2.26* 02/23/2013   INR 3.06* 02/21/2013   INR 4.22* 02/20/2013     Lab  Results  Component Value Date   PLT 624* 02/23/2013    Antibiotics    Anti-infectives   Start     Dose/Rate Route Frequency Ordered Stop   02/23/13 0930  doxycycline (VIBRAMYCIN) 100 mg in dextrose 5 % 250 mL IVPB     100 mg 125 mL/hr over 120 Minutes Intravenous Every 12 hours 02/23/13 0919     02/22/13 2200  doxycycline (VIBRA-TABS) tablet 100 mg  Status:  Discontinued     100 mg Oral Every 12 hours 02/22/13 1430 02/23/13 0919   02/22/13 1400  doxycycline (VIBRAMYCIN) 100 mg in dextrose 5 % 250 mL IVPB  Status:  Discontinued     100 mg 125 mL/hr over 120 Minutes Intravenous Every 12 hours 02/22/13 1315 02/22/13 1430   02/18/13 2000  vancomycin (VANCOCIN) IVPB 750 mg/150 ml premix  Status:  Discontinued     750 mg 150 mL/hr over 60 Minutes Intravenous Every 48 hours 02/18/13 1840 02/19/13 0941   02/18/13 0945  cefTRIAXone (ROCEPHIN) 1 g in dextrose 5 % 50 mL IVPB  Status:  Discontinued     1 g 100 mL/hr over 30 Minutes Intravenous Every 24 hours 02/18/13 0933 02/20/13 0644          Subjective:    Lisa Nixon today has, No headache, No chest pain, No abdominal pain - No Nausea, No new weakness tingling or numbness, No Cough - SOB. Mild L arm pain  Objective:   Filed Vitals:   02/22/13 1006 02/22/13 1440 02/22/13 1959 02/23/13 0506  BP: 120/78 137/75 146/79 135/81  Pulse: 110 114 105 103  Temp: 97.2 F (36.2 C) 99 F (37.2 C) 99.2 F (37.3 C) 98.5 F (36.9 C)  TempSrc: Oral Oral Oral Oral  Resp: 20 18 20 16   Weight: 51.4 kg (113 lb 5.1 oz)     SpO2: 98% 97% 100% 99%    Wt Readings from Last 3 Encounters:  02/22/13 51.4 kg (113 lb 5.1 oz)     Intake/Output Summary (Last 24 hours) at 02/23/13 0921 Last data filed at 02/22/13 1440  Gross per 24 hour  Intake    120 ml  Output      0 ml  Net    120 ml    Exam Awake Alert, Oriented X 3, No new F.N deficits, Normal affect Mackey.AT,PERRAL Supple Neck,No JVD, No cervical lymphadenopathy appriciated.  Symmetrical Chest wall movement, Good air movement bilaterally, CTAB RRR,No Gallops,Rubs or new Murmurs, No Parasternal Heave +ve B.Sounds, Abd Soft, Non tender, No organomegaly appriciated, No rebound - guarding or rigidity. No Cyanosis, Clubbing or edema, No new Rash or bruise L arm is swollen elbow to hand, large Welp under the cubital fossa, skin has some chemical burn -bandage in place, good sensation in the fingers.   Data Review   Micro Results Recent Results (from the past 240 hour(s))  MRSA PCR SCREENING     Status: None   Collection Time    02/18/13 11:10 AM      Result Value Range Status   MRSA by PCR NEGATIVE  NEGATIVE Final   Comment:            The GeneXpert MRSA Assay (FDA     approved for NASAL specimens     only), is one component of a     comprehensive MRSA colonization     surveillance program. It is not     intended to diagnose MRSA     infection nor to guide  or     monitor treatment for     MRSA infections.  CULTURE, BLOOD (ROUTINE X 2)     Status: None   Collection Time    02/18/13   4:30 PM      Result Value Range Status   Specimen Description BLOOD LEFT HAND   Final   Special Requests BOTTLES DRAWN AEROBIC ONLY 8CC   Final   Culture  Setup Time     Final   Value: 02/19/2013 00:24     Performed at [facility]   Culture     Final   Value:        BLOOD CULTURE RECEIVED NO GROWTH TO DATE CULTURE WILL BE HELD FOR 5 DAYS BEFORE ISSUING A FINAL NEGATIVE REPORT     Performed at [facility]   Report Status PENDING   Incomplete  CULTURE, BLOOD (ROUTINE X 2)     Status: None   Collection Time    02/18/13  4:50 PM      Result Value Range Status   Specimen Description BLOOD RIGHT HAND   Final   Special Requests BOTTLES DRAWN AEROBIC ONLY 2CC   Final   Culture  Setup Time     Final   Value: 02/19/2013 00:24     Performed at [facility]   Culture     Final   Value:        BLOOD CULTURE RECEIVED NO GROWTH TO DATE CULTURE WILL BE HELD FOR 5 DAYS BEFORE ISSUING A FINAL NEGATIVE REPORT     Performed at [facility]   Report Status PENDING   Incomplete  URINE CULTURE     Status: None   Collection Time    02/19/13  9:29 AM      Result Value Range Status   Specimen Description URINE, RANDOM   Final   Special Requests NONE   Final   Culture  Setup Time     Final   Value: 02/19/2013 10:13     Performed at [facility] Count     Final   Value: NO GROWTH     Performed at [facility]   Culture     Final   Value: NO GROWTH     Performed at [facility]   Report Status 02/20/2013 FINAL   Final    Radiology Reports Ct Abdomen Pelvis W Contrast  02/15/2013   CLINICAL DATA:  Left lower quadrant abdominal pain and tenderness, worse over the last 2 days, history of prior surgery for small bowel obstruction, history of right breast carcinoma in 2008  EXAM: CT ABDOMEN AND PELVIS WITH CONTRAST  TECHNIQUE: Multidetector CT imaging of the abdomen and pelvis was performed using the standard protocol following bolus  administration of intravenous contrast.  CONTRAST:  OMNIPAQUE IOHEXOL 300 MG/ML  SOLN  COMPARISON:  CT abdomen pelvis of 05/24/2008  FINDINGS: The lung bases are clear other than linear scarring in the periphery of the right lower lobe. The liver enhances with no focal abnormality and no ductal dilatation is seen. No calcified gallstones are noted. The pancreas appears somewhat atrophic with minimal prominence of the pancreatic duct. The adrenal glands and spleen are unremarkable. The configuration of the gastroesophageal junction is atypical which possibly is due to prior surgery and gastric fundoplication the kidneys enhance with no calculus or mass and the pelvocaliceal systems are unremarkable on delayed images. The abdominal aorta is normal in caliber  with moderate atheromatous change present. No adenopathy is seen. A tiny midline ventral hernia is present containing only a small amount of fat just above the umbilicus.  In this elderly patient the uterus is lobular and enlarged probably due to a large degenerating uterine fibroid. A uterine malignancy would be difficult to exclude. A pelvic ultrasound may be helpful to assess further. Also there is a cystic structure anteriorly in the left pelvis of approximately 2.6 cm in diameter. Ultrasound may also be helpful in assessing this possible adnexal cyst. No free fluid is seen within the pelvis. The urinary bladder is unremarkable. Both large and small bowel gas is present without obstruction. The appendix and terminal ileum are unremarkable. There is some exaggeration of lumbar lordosis. An old partial compression deformity of T12 is noted with degenerative change. Also there is slight anterolisthesis of L4 on L5 by 4 mm and L5 on S1 by 5 mm most likely degenerative in origin.  IMPRESSION: 1. Abnormal appearance of the uterus may be due to a large degenerating uterine fibroid, but a malignancy would be difficult to exclude. Correlate clinically and  consider ultrasound or MRI to assess further. Also question 2.6 cm anterior left adnexal cyst. 2. Abnormality of the gastric fundus may be due to prior gastric fundoplication. Correlate clinically. 3. Slight anterolisthesis of L4 on L5 and L5 on S1 most likely degenerative in origin. 4. The appendix and terminal ileum appear normal.   Electronically Signed   By: Dwyane Dee M.D.   On: 02/15/2013 15:20   Dg Chest Port 1 View  02/18/2013   CLINICAL DATA:  Chest pain  EXAM: PORTABLE CHEST - 1 VIEW  COMPARISON:  05/22/2007  FINDINGS: Chronic cardiopericardial enlargement. Aortic atherosclerosis. Chronic interstitial coarsening and hyperinflation. No edema, effusion, or infiltrate. Mottled lucency overlapping the left shoulder is likely gas trapped underneath a pad. No evidence of pneumothorax.  IMPRESSION: Cardiomegaly and COPD.  No evidence of active disease.   Electronically Signed   By: Tiburcio Pea M.D.   On: 02/18/2013 06:27    CBC  Recent Labs Lab 02/18/13 0430 02/18/13 0442 02/18/13 1701 02/19/13 0500 02/21/13 0740 02/23/13 0545  WBC 16.2*  --  22.3* 23.1* 17.0* 14.8*  HGB 9.2* 9.9* 10.1* 9.9* 8.5* 9.0*  HCT 28.6* 29.0* 30.4* 30.3* 26.4* 28.3*  PLT 594*  --  678* 606* 526* 624*  MCV 90.5  --  89.1 88.1 88.6 89.0  MCH 29.1  --  29.6 28.8 28.5 28.3  MCHC 32.2  --  33.2 32.7 32.2 31.8  RDW 16.4*  --  16.1* 16.3* 16.6* 16.5*  LYMPHSABS 2.7  --  1.5  --   --   --   MONOABS 1.9*  --  3.1*  --   --   --   EOSABS 0.0  --  0.0  --   --   --   BASOSABS 0.0  --  0.0  --   --   --     Chemistries   Recent Labs Lab 02/18/13 0430 02/18/13 0442 02/19/13 0500 02/20/13 0715 02/21/13 0740 02/23/13 0545  NA 130* 131* 129* 132* 132* 136  K 6.0* 5.4* 5.1 4.5 4.1 3.6  CL 93* 102 96 101 101 103  CO2 17*  --  17* 19 17* 18*  GLUCOSE 238* 234* 109* 91 80 93  BUN 29* 28* 34* 35* 29* 18  CREATININE 2.19* 2.50* 2.12* 1.90* 1.26* 0.84  CALCIUM 11.4*  --  9.3 8.0* 7.9* 8.3*  AST 31  --   --    --   --   --   ALT 15  --   --   --   --   --   ALKPHOS 66  --   --   --   --   --   BILITOT 0.4  --   --   --   --   --    ------------------------------------------------------------------------------------------------------------------ CrCl is unknown because there is no height on file for the current visit. ------------------------------------------------------------------------------------------------------------------ No results found for this basename: HGBA1C,  in the last 72 hours ------------------------------------------------------------------------------------------------------------------ No results found for this basename: CHOL, HDL, LDLCALC, TRIG, CHOLHDL, LDLDIRECT,  in the last 72 hours ------------------------------------------------------------------------------------------------------------------ No results found for this basename: TSH, T4TOTAL, FREET3, T3FREE, THYROIDAB,  in the last 72 hours ------------------------------------------------------------------------------------------------------------------  Recent Labs  02/22/13 1445  FERRITIN 842*  TIBC Not calculated due to Iron <10.  IRON <10*    Coagulation profile  Recent Labs Lab 02/18/13 1701 02/19/13 0500 02/20/13 0715 02/21/13 1145 02/23/13 0545  INR 2.71* 3.46* 4.22* 3.06* 2.26*    No results found for this basename: DDIMER,  in the last 72 hours  Cardiac Enzymes  Recent Labs Lab 02/18/13 1534 02/19/13 0500  TROPONINI <0.30 <0.30   ------------------------------------------------------------------------------------------------------------------ No components found with this basename: POCBNP,      Time Spent in minutes  35   SINGH,PRASHANT K M.D on 02/23/2013 at 9:21 AM  Between 7am to 7pm - Pager - [phone]  After 7pm go to [email or web address] - password [email or web address]  And look for the night coverage person covering for me after hours  [facility] Office  [phone]

## 2013-02-23 NOTE — Progress Notes (Signed)
CSW continuing to follow patient for dc when medically ready.  Davan Nawabi, MSW, LCSWA [phone]  

## 2013-02-23 NOTE — Progress Notes (Signed)
Physical Therapy Treatment Patient Details Name: Lisa Nixon MRN: [id number] DOB: 1922/08/13 Today's Date: 02/23/2013 Time: 4098-1191 PT Time Calculation (min): 15 min  PT Assessment / Plan / Recommendation  History of Present Illness Pt admit with malnutrition, AMS, and digitalis toxicity.  Afib as well.    PT Comments   Patient with limited tolerance to sitting and bed exercise today.  C/o nausea after sitting about 1.5-2 minutes and after 5 reps of LE exercise.  Did note pt with run of SVT this a.m.  which could also contribute to symptoms.  Will continue as tolerated and encourage OOB as able.   Follow Up Recommendations  Supervision/Assistance - 24 hour;SNF     Does the patient have the potential to tolerate intense rehabilitation   N/A  Barriers to Discharge  None      Equipment Recommendations  None recommended by PT    Recommendations for Other Services  None  Frequency Min 3X/week   Progress towards PT Goals Progress towards PT goals: Not progressing toward goals - comment (limited tx due to activity intolerance)  Plan Current plan remains appropriate    Precautions / Restrictions Precautions Precautions: Fall Restrictions Weight Bearing Restrictions: Yes   Pertinent Vitals/Pain C/o mild pain right UE; HR 102, SpO2 97%, BP 143/85 after activity    Mobility  Bed Mobility Bed Mobility: Supine to Sit;Sit to Supine Supine to Sit: 3: Mod assist Sitting - Scoot to Edge of Bed: 2: Max assist Sit to Supine: 2: Max assist Details for Bed Mobility Assistance: guiding legs to edge of bed and assisting trunk upright, helped with legs and trunk to supine Transfers Transfers: Not assessed Details for Transfer Assistance: too sick per pt to attempt out of bed    Exercises General Exercises - Lower Extremity Ankle Circles/Pumps: AROM;Both;5 reps;Seated Long Arc Quad: AROM;Both;5 reps;Seated Heel Slides: AAROM;Both;5 reps;Supine     PT Goals (current goals can now be  found in the care plan section)    Visit Information  Last PT Received On: 02/23/13 History of Present Illness: Pt admit with malnutrition, AMS, and digitalis toxicity.  Afib as well.     Subjective Data   Reports feels sick to her stomach   Cognition  Cognition Arousal/Alertness: Awake/alert Behavior During Therapy: WFL for tasks assessed/performed Overall Cognitive Status: No family/caregiver present to determine baseline cognitive functioning Memory: Decreased short-term memory Following Commands: Follows multi-step commands with increased time Problem Solving: Requires verbal cues    Balance  Static Sitting Balance Static Sitting - Balance Support: Bilateral upper extremity supported;Feet unsupported Static Sitting - Level of Assistance: 5: Stand by assistance;4: Min assist Static Sitting - Comment/# of Minutes: difficulty tolerating sitting unsupported due to pain in back (overall weakness evident in trunk/postural musculature)  End of Session PT - End of Session Activity Tolerance: Patient limited by fatigue;Other (comment) (by nausea) Patient left: in bed;with call bell/phone within reach Nurse Communication: Mobility status   GP     [facility] 02/23/2013, 11:17 AM Sheran Lawless, PT [phone] 02/23/2013

## 2013-02-24 LAB — GLUCOSE, CAPILLARY
Glucose-Capillary: 147 mg/dL — ABNORMAL HIGH (ref 70–99)
Glucose-Capillary: 124 mg/dL — ABNORMAL HIGH (ref 70–99)
Glucose-Capillary: 111 mg/dL — ABNORMAL HIGH (ref 70–99)

## 2013-02-24 LAB — BASIC METABOLIC PANEL
CO2: 18 mEq/L — ABNORMAL LOW (ref 19–32)
Calcium: 8 mg/dL — ABNORMAL LOW (ref 8.4–10.5)
Chloride: 103 mEq/L (ref 96–112)
Creatinine, Ser: 0.8 mg/dL (ref 0.50–1.10)
GFR calc non Af Amer: 64 mL/min — ABNORMAL LOW (ref >90)
Glucose, Bld: 150 mg/dL — ABNORMAL HIGH (ref 70–99)
Sodium: 134 mEq/L — ABNORMAL LOW (ref 135–145)

## 2013-02-24 LAB — CBC
HCT: 28.1 % — ABNORMAL LOW (ref 36.0–46.0)
Hemoglobin: 8.9 g/dL — ABNORMAL LOW (ref 12.0–15.0)
MCH: 28.3 pg (ref 26.0–34.0)
MCHC: 31.7 g/dL (ref 30.0–36.0)
MCV: 89.2 fL (ref 78.0–100.0)
Platelets: 613 10*3/uL — ABNORMAL HIGH (ref 150–400)
RBC: 3.15 MIL/uL — ABNORMAL LOW (ref 3.87–5.11)

## 2013-02-24 LAB — PROTIME-INR: Prothrombin Time: 26.4 seconds — ABNORMAL HIGH (ref 11.6–15.2)

## 2013-02-24 MED ORDER — POTASSIUM CHLORIDE CRYS ER 20 MEQ PO TBCR
40.0000 meq | EXTENDED_RELEASE_TABLET | Freq: Once | ORAL | Status: AC
  Administered 2013-02-24: 40 meq via ORAL
  Filled 2013-02-24: qty 2

## 2013-02-24 NOTE — Progress Notes (Signed)
ANTICOAGULATION CONSULT NOTE - Follow Up Consult  Pharmacy Consult for Coumadin Indication: atrial fibrillation  No Known Allergies  Patient Measurements: Weight: 113 lb 5.1 oz (51.4 kg) Heparin Dosing Weight:    Vital Signs: Temp: 98.8 F (37.1 C) (10/25 0502) Temp src: Oral (10/25 0502) BP: 119/54 mmHg (10/25 0502) Pulse Rate: 85 (10/25 0502)  Labs:  Recent Labs  02/21/13 1145 02/23/13 0545 02/24/13 0431  HGB  --  9.0* 8.9*  HCT  --  28.3* 28.1*  PLT  --  624* 613*  LABPROT 30.5* 24.2* 26.4*  INR 3.06* 2.26* 2.53*  CREATININE  --  0.84 0.80    CrCl is unknown because there is no height on file for the current visit.   Assessment: 79 y.o. female admitted 02/18/2013 with cardiac arrest.  PMH: Afib, hyperlipidemia, GERD, HTN, Breast CA, CAD  AC: Coumadin PTA for afib, h/o PE.(PTA Warfarin: 5mg  on Mon/Wed and 2.5mg  TTFSS with admit INR 2.36). INR 2.53 this am.   Goal of Therapy:  INR 2-3 Monitor platelets by anticoagulation protocol: Yes   Plan:  Coumadin 5mg  M/W and 2.5mg  TTSFF Daily INR   Barak Bialecki S. Merilynn Finland, PharmD, BCPS Clinical Staff Pharmacist Pager [phone]  Misty Stanley Stillinger 02/24/2013,10:48 AM

## 2013-02-24 NOTE — Progress Notes (Signed)
[facility]                                                                                Patient Demographics  Lisa Nixon, is a 79 y.o. female, DOB - 30-Apr-1923, JXB:[id number]  Admit date - 02/18/2013   Admitting Physician Pamella Pert, MD  Outpatient Primary MD for the patient is Pamella Pert, MD  LOS - 6   Chief Complaint  Patient presents with  . Bradycardia  . 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 Clinically much improved.      Atrial fibrillation with RVR  -May be secondary to pain. Continue warfarin, metoprolol, D/W Dr Jacinto Halim and added Cardiazem , stable. DC telemetry - Currently therapeutic INR      History of Pulmonary Embolism  -On coumadin and therapeutic   Lab Results  Component Value Date   INR 2.53* 02/24/2013   INR 2.26* 02/23/2013   INR 3.06* 02/21/2013      Myalgia  -Statin discontinued. Improving.     Malnutrition with hypoproteinemia  -Will have nutrition consulted.     Thrombocytosis  -reactionary- monitor     Anemia  -Currently at baseline. No active signs of bleeding. Iron deficient, replace orally.     Acute renal failure  Prerenal, resolved with IV fluids.    Digitalis toxicity  -Followed and corrected by cardiology.     Diabetes mellitus  Continue CBG monitoring and ISS.   CBG (last 3)   Recent Labs  02/23/13 1621 02/23/13 2215 02/24/13 0219  GLUCAP 155* 79 147*     Lab Results  Component Value Date   HGBA1C 6.0* 02/18/2013       Uterine fibroid  -Found on CT scan of Abd/pelvis. Outpt OB follow up.      Code Status: Full  Family  Communication:    Disposition Plan: SNF   Procedures     Consults  Cards, Hand Surgery   Medications  Scheduled Meds: . diltiazem  120 mg Oral Q12H  . doxycycline (VIBRAMYCIN) IV  100 mg Intravenous Q12H  . feeding supplement (RESOURCE BREEZE)  1 Container Oral BID BM  . ferrous sulfate  325 mg Oral TID WC  . insulin aspart  0-5 Units Subcutaneous QHS  . insulin aspart  0-9 Units Subcutaneous TID WC  . metoprolol tartrate  50 mg Oral BID  . pantoprazole  40 mg Oral Daily  . potassium chloride  40 mEq Oral Once  . silver sulfADIAZINE   Topical BID  . vitamin B-12  1,000 mcg Oral Daily  . warfarin  2.5 mg Oral Custom  . [START ON 02/26/2013] warfarin  5 mg Oral Custom  . Warfarin - Pharmacist Dosing Inpatient   Does not apply q1800   Continuous Infusions:  PRN Meds:.acetaminophen, fentaNYL, lidocaine-prilocaine, ondansetron (ZOFRAN) IV  DVT Prophylaxis  Coumadin  Lab Results  Component Value Date   INR 2.53* 02/24/2013   INR 2.26* 02/23/2013   INR 3.06* 02/21/2013     Lab Results  Component Value Date   PLT 613* 02/24/2013    Antibiotics    Anti-infectives   Start     Dose/Rate Route Frequency Ordered Stop   02/23/13 1030  doxycycline (VIBRAMYCIN) 100 mg in dextrose 5 % 250 mL IVPB     100 mg 125 mL/hr over 120 Minutes Intravenous Every 12 hours 02/23/13 0919     02/22/13 2200  doxycycline (VIBRA-TABS) tablet 100 mg  Status:  Discontinued     100 mg Oral Every 12 hours 02/22/13 1430 02/23/13 0919   02/22/13 1400  doxycycline (VIBRAMYCIN) 100 mg in dextrose 5 % 250 mL IVPB  Status:  Discontinued     100 mg 125 mL/hr over 120 Minutes Intravenous Every 12 hours 02/22/13 1315 02/22/13 1430   02/18/13 2000  vancomycin (VANCOCIN) IVPB 750 mg/150 ml premix  Status:  Discontinued     750 mg 150 mL/hr over 60 Minutes Intravenous Every 48 hours 02/18/13 1840 02/19/13 0941   02/18/13 0945  cefTRIAXone (ROCEPHIN) 1 g in dextrose 5 % 50 mL IVPB  Status:  Discontinued      1 g 100 mL/hr over 30 Minutes Intravenous Every 24 hours 02/18/13 0933 02/20/13 0644          Subjective:   Lisa Nixon today has, No headache, No chest pain, No abdominal pain - No Nausea, No new weakness tingling or numbness, No Cough - SOB. Mild L arm pain  Objective:   Filed Vitals:   02/23/13 1052 02/23/13 1421 02/23/13 2034 02/24/13 0502  BP: 143/85 120/73 119/50 119/54  Pulse: 102 67 109 85  Temp:  98 F (36.7 C) 99.1 F (37.3 C) 98.8 F (37.1 C)  TempSrc:  Axillary Oral Oral  Resp:  18 18 18   Weight:      SpO2: 97% 96% 97% 97%    Wt Readings from Last 3 Encounters:  02/22/13 51.4 kg (113 lb 5.1 oz)     Intake/Output Summary (Last 24 hours) at 02/24/13 1030 Last data filed at 02/24/13 0504  Gross per 24 hour  Intake   1075 ml  Output      0 ml  Net   1075 ml    Exam Awake Alert, Oriented X 3, No new F.N deficits, Normal affect [place].AT,PERRAL Supple Neck,No JVD, No cervical lymphadenopathy appriciated.  Symmetrical Chest wall movement, Good air movement bilaterally, CTAB RRR,No Gallops,Rubs or new Murmurs, No Parasternal Heave +ve B.Sounds, Abd Soft, Non tender, No organomegaly appriciated, No rebound - guarding or rigidity. No Cyanosis, Clubbing or edema, No new Rash or bruise L arm is swollen elbow to hand, large Welp under the cubital fossa, skin has some chemical burn -bandage in place, good sensation in the fingers.   Data Review   Micro Results Recent Results (from the past 240 hour(s))  MRSA PCR SCREENING     Status: None   Collection Time    02/18/13 11:10 AM      Result Value Range Status   MRSA by PCR NEGATIVE  NEGATIVE Final   Comment:            The GeneXpert MRSA Assay (FDA     approved for NASAL specimens     only), is one component of a     comprehensive MRSA colonization     surveillance program. It is not  intended to diagnose MRSA     infection nor to guide or     monitor treatment for     MRSA infections.  CULTURE,  BLOOD (ROUTINE X 2)     Status: None   Collection Time    02/18/13  4:30 PM      Result Value Range Status   Specimen Description BLOOD LEFT HAND   Final   Special Requests BOTTLES DRAWN AEROBIC ONLY 8CC   Final   Culture  Setup Time     Final   Value: 02/19/2013 00:24     Performed at [facility]   Culture     Final   Value:        BLOOD CULTURE RECEIVED NO GROWTH TO DATE CULTURE WILL BE HELD FOR 5 DAYS BEFORE ISSUING A FINAL NEGATIVE REPORT     Performed at [facility]   Report Status PENDING   Incomplete  CULTURE, BLOOD (ROUTINE X 2)     Status: None   Collection Time    02/18/13  4:50 PM      Result Value Range Status   Specimen Description BLOOD RIGHT HAND   Final   Special Requests BOTTLES DRAWN AEROBIC ONLY 2CC   Final   Culture  Setup Time     Final   Value: 02/19/2013 00:24     Performed at [facility]   Culture     Final   Value:        BLOOD CULTURE RECEIVED NO GROWTH TO DATE CULTURE WILL BE HELD FOR 5 DAYS BEFORE ISSUING A FINAL NEGATIVE REPORT     Performed at [facility]   Report Status PENDING   Incomplete  URINE CULTURE     Status: None   Collection Time    02/19/13  9:29 AM      Result Value Range Status   Specimen Description URINE, RANDOM   Final   Special Requests NONE   Final   Culture  Setup Time     Final   Value: 02/19/2013 10:13     Performed at [facility] Count     Final   Value: NO GROWTH     Performed at [facility]   Culture     Final   Value: NO GROWTH     Performed at [facility]   Report Status 02/20/2013 FINAL   Final    Radiology Reports Ct Abdomen Pelvis W Contrast  02/15/2013   CLINICAL DATA:  Left lower quadrant abdominal pain and tenderness, worse over the last 2 days, history of prior surgery for small bowel obstruction, history of right breast carcinoma in 2008  EXAM: CT ABDOMEN AND PELVIS WITH CONTRAST  TECHNIQUE: Multidetector CT imaging of the abdomen and  pelvis was performed using the standard protocol following bolus administration of intravenous contrast.  CONTRAST:  OMNIPAQUE IOHEXOL 300 MG/ML  SOLN  COMPARISON:  CT abdomen pelvis of 05/24/2008  FINDINGS: The lung bases are clear other than linear scarring in the periphery of the right lower lobe. The liver enhances with no focal abnormality and no ductal dilatation is seen. No calcified gallstones are noted. The pancreas appears somewhat atrophic with minimal prominence of the pancreatic duct. The adrenal glands and spleen are unremarkable. The configuration of the gastroesophageal junction is atypical which possibly is due to prior surgery and gastric fundoplication the kidneys enhance with no calculus or mass and the pelvocaliceal systems  are unremarkable on delayed images. The abdominal aorta is normal in caliber with moderate atheromatous change present. No adenopathy is seen. A tiny midline ventral hernia is present containing only a small amount of fat just above the umbilicus.  In this elderly patient the uterus is lobular and enlarged probably due to a large degenerating uterine fibroid. A uterine malignancy would be difficult to exclude. A pelvic ultrasound may be helpful to assess further. Also there is a cystic structure anteriorly in the left pelvis of approximately 2.6 cm in diameter. Ultrasound may also be helpful in assessing this possible adnexal cyst. No free fluid is seen within the pelvis. The urinary bladder is unremarkable. Both large and small bowel gas is present without obstruction. The appendix and terminal ileum are unremarkable. There is some exaggeration of lumbar lordosis. An old partial compression deformity of T12 is noted with degenerative change. Also there is slight anterolisthesis of L4 on L5 by 4 mm and L5 on S1 by 5 mm most likely degenerative in origin.  IMPRESSION: 1. Abnormal appearance of the uterus may be due to a large degenerating uterine fibroid, but a  malignancy would be difficult to exclude. Correlate clinically and consider ultrasound or MRI to assess further. Also question 2.6 cm anterior left adnexal cyst. 2. Abnormality of the gastric fundus may be due to prior gastric fundoplication. Correlate clinically. 3. Slight anterolisthesis of L4 on L5 and L5 on S1 most likely degenerative in origin. 4. The appendix and terminal ileum appear normal.   Electronically Signed   By: Dwyane Dee M.D.   On: 02/15/2013 15:20   Dg Chest Port 1 View  02/18/2013   CLINICAL DATA:  Chest pain  EXAM: PORTABLE CHEST - 1 VIEW  COMPARISON:  05/22/2007  FINDINGS: Chronic cardiopericardial enlargement. Aortic atherosclerosis. Chronic interstitial coarsening and hyperinflation. No edema, effusion, or infiltrate. Mottled lucency overlapping the left shoulder is likely gas trapped underneath a pad. No evidence of pneumothorax.  IMPRESSION: Cardiomegaly and COPD.  No evidence of active disease.   Electronically Signed   By: Tiburcio Pea M.D.   On: 02/18/2013 06:27    CBC  Recent Labs Lab 02/18/13 0430  02/18/13 1701 02/19/13 0500 02/21/13 0740 02/23/13 0545 02/24/13 0431  WBC 16.2*  --  22.3* 23.1* 17.0* 14.8* 14.6*  HGB 9.2*  < > 10.1* 9.9* 8.5* 9.0* 8.9*  HCT 28.6*  < > 30.4* 30.3* 26.4* 28.3* 28.1*  PLT 594*  --  678* 606* 526* 624* 613*  MCV 90.5  --  89.1 88.1 88.6 89.0 89.2  MCH 29.1  --  29.6 28.8 28.5 28.3 28.3  MCHC 32.2  --  33.2 32.7 32.2 31.8 31.7  RDW 16.4*  --  16.1* 16.3* 16.6* 16.5* 16.7*  LYMPHSABS 2.7  --  1.5  --   --   --   --   MONOABS 1.9*  --  3.1*  --   --   --   --   EOSABS 0.0  --  0.0  --   --   --   --   BASOSABS 0.0  --  0.0  --   --   --   --   < > = values in this interval not displayed.  Chemistries   Recent Labs Lab 02/18/13 0430  02/19/13 0500 02/20/13 0715 02/21/13 0740 02/23/13 0545 02/24/13 0431  NA 130*  < > 129* 132* 132* 136 134*  K 6.0*  < > 5.1 4.5 4.1 3.6 3.4*  CL 93*  < > 96 101 101 103 103  CO2 17*   --  17* 19 17* 18* 18*  GLUCOSE 238*  < > 109* 91 80 93 150*  BUN 29*  < > 34* 35* 29* 18 14  CREATININE 2.19*  < > 2.12* 1.90* 1.26* 0.84 0.80  CALCIUM 11.4*  --  9.3 8.0* 7.9* 8.3* 8.0*  AST 31  --   --   --   --   --   --   ALT 15  --   --   --   --   --   --   ALKPHOS 66  --   --   --   --   --   --   BILITOT 0.4  --   --   --   --   --   --   < > = values in this interval not displayed. ------------------------------------------------------------------------------------------------------------------ CrCl is unknown because there is no height on file for the current visit. ------------------------------------------------------------------------------------------------------------------ No results found for this basename: HGBA1C,  in the last 72 hours ------------------------------------------------------------------------------------------------------------------ No results found for this basename: CHOL, HDL, LDLCALC, TRIG, CHOLHDL, LDLDIRECT,  in the last 72 hours ------------------------------------------------------------------------------------------------------------------ No results found for this basename: TSH, T4TOTAL, FREET3, T3FREE, THYROIDAB,  in the last 72 hours ------------------------------------------------------------------------------------------------------------------  Recent Labs  02/22/13 1445  FERRITIN 842*  TIBC Not calculated due to Iron <10.  IRON <10*    Coagulation profile  Recent Labs Lab 02/19/13 0500 02/20/13 0715 02/21/13 1145 02/23/13 0545 02/24/13 0431  INR 3.46* 4.22* 3.06* 2.26* 2.53*    No results found for this basename: DDIMER,  in the last 72 hours  Cardiac Enzymes  Recent Labs Lab 02/18/13 1534 02/19/13 0500  TROPONINI <0.30 <0.30   ------------------------------------------------------------------------------------------------------------------ No components found with this basename: POCBNP,      Time Spent in  minutes  35   SINGH,PRASHANT K M.D on 02/24/2013 at 10:30 AM  Between 7am to 7pm - Pager - [phone]  After 7pm go to [email or web address] - password [email or web address]  And look for the night coverage person covering for me after hours  [facility] Office  [phone]

## 2013-02-25 DIAGNOSIS — I959 Hypotension, unspecified: Secondary | ICD-10-CM

## 2013-02-25 DIAGNOSIS — IMO0002 Reserved for concepts with insufficient information to code with codable children: Secondary | ICD-10-CM

## 2013-02-25 LAB — CBC
HCT: 28.5 % — ABNORMAL LOW (ref 36.0–46.0)
Hemoglobin: 9.3 g/dL — ABNORMAL LOW (ref 12.0–15.0)
MCH: 28.6 pg (ref 26.0–34.0)
MCHC: 32.6 g/dL (ref 30.0–36.0)
MCV: 87.7 fL (ref 78.0–100.0)
RBC: 3.25 MIL/uL — ABNORMAL LOW (ref 3.87–5.11)
RDW: 16.6 % — ABNORMAL HIGH (ref 11.5–15.5)

## 2013-02-25 LAB — CULTURE, BLOOD (ROUTINE X 2): Culture: NO GROWTH

## 2013-02-25 LAB — GLUCOSE, CAPILLARY
Glucose-Capillary: 112 mg/dL — ABNORMAL HIGH (ref 70–99)
Glucose-Capillary: 94 mg/dL (ref 70–99)

## 2013-02-25 LAB — BASIC METABOLIC PANEL
BUN: 11 mg/dL (ref 6–23)
CO2: 18 mEq/L — ABNORMAL LOW (ref 19–32)
Calcium: 8 mg/dL — ABNORMAL LOW (ref 8.4–10.5)
Creatinine, Ser: 0.73 mg/dL (ref 0.50–1.10)
Glucose, Bld: 103 mg/dL — ABNORMAL HIGH (ref 70–99)
Potassium: 4 mEq/L (ref 3.5–5.1)

## 2013-02-25 MED ORDER — WARFARIN SODIUM 1 MG PO TABS
1.0000 mg | ORAL_TABLET | Freq: Once | ORAL | Status: AC
  Administered 2013-02-25: 1 mg via ORAL
  Filled 2013-02-25: qty 1

## 2013-02-25 MED ORDER — BISACODYL 10 MG RE SUPP: 10.0000 mg | Freq: Once | RECTAL | Status: DC

## 2013-02-25 MED ORDER — DOCUSATE SODIUM 100 MG PO CAPS
200.0000 mg | ORAL_CAPSULE | Freq: Two times a day (BID) | ORAL | Status: DC
  Administered 2013-02-25 – 2013-02-26 (×3): 200 mg via ORAL
  Filled 2013-02-25 (×4): qty 2

## 2013-02-25 MED ORDER — POLYETHYLENE GLYCOL 3350 17 G PO PACK
17.0000 g | PACK | Freq: Two times a day (BID) | ORAL | Status: DC
  Administered 2013-02-25 – 2013-02-26 (×3): 17 g via ORAL
  Filled 2013-02-25 (×5): qty 1

## 2013-02-25 NOTE — Progress Notes (Signed)
ANTICOAGULATION CONSULT NOTE - Follow Up Consult  Pharmacy Consult for Coumadin Indication: atrial fibrillation  No Known Allergies  Patient Measurements: Weight: 113 lb 5.1 oz (51.4 kg) Heparin Dosing Weight:    Vital Signs: Temp: 97.5 F (36.4 C) (10/26 0422) Temp src: Oral (10/26 0422) BP: 136/57 mmHg (10/26 0422) Pulse Rate: 76 (10/26 0422)  Labs:  Recent Labs  02/23/13 0545 02/24/13 0431 02/25/13 0553  HGB 9.0* 8.9* 9.3*  HCT 28.3* 28.1* 28.5*  PLT 624* 613* 611*  LABPROT 24.2* 26.4* 29.4*  INR 2.26* 2.53* 2.91*  CREATININE 0.84 0.80 0.73    CrCl is unknown because there is no height on file for the current visit.   Assessment: 79 y.o. female admitted 02/18/2013 with cardiac arrest.  PMH: Afib, hyperlipidemia, GERD, HTN, Breast CA, CAD  AC: Coumadin PTA for afib, h/o PE.(PTA Warfarin: 5mg  on Mon/Wed and 2.5mg  TTFSS with admit INR 2.36). INR 2.91 this am.   Goal of Therapy:  INR 2-3 Monitor platelets by anticoagulation protocol: Yes   Plan:  Give Coumadin only 1mg  tonight due to swift rise in INR   Vibha Ferdig S. Merilynn Finland, PharmD, BCPS Clinical Staff Pharmacist Pager [phone]  Pasty Spillers 02/25/2013,10:37 AM

## 2013-02-25 NOTE — Progress Notes (Signed)
[facility]                                                                                Patient Demographics  Lisa Nixon, is a 79 y.o. female, DOB - February 27, 1923, NGE:[id number]  Admit date - 02/18/2013   Admitting Physician Pamella Pert, MD  Outpatient Primary MD for the patient is Pamella Pert, MD  LOS - 7   Chief Complaint  Patient presents with  . Bradycardia  . 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 Clinically much improved.      Atrial fibrillation with RVR  -May be secondary to pain. Continue warfarin, metoprolol, D/W Dr Jacinto Halim and added Cardiazem , stable. DC telemetry - Currently therapeutic INR      History of Pulmonary Embolism  -On coumadin and therapeutic   Lab Results  Component Value Date   INR 2.91* 02/25/2013   INR 2.53* 02/24/2013   INR 2.26* 02/23/2013      Myalgia  -Statin discontinued. Improving.     Malnutrition with hypoproteinemia  -Will have nutrition consulted.     Thrombocytosis  -reactionary- monitor     Anemia  -Currently at baseline. No active signs of bleeding. Iron deficient, replace orally.     Acute renal failure  Prerenal, resolved with IV fluids.    Digitalis toxicity  -Followed and corrected by cardiology.     Diabetes mellitus  Continue CBG monitoring and ISS.   CBG (last 3)   Recent Labs  02/24/13 1622 02/24/13 2106 02/25/13 0638  GLUCAP 111* 94 112*     Lab Results  Component Value Date   HGBA1C 6.0* 02/18/2013       Uterine fibroid  -Found on CT scan of Abd/pelvis. Outpt OB follow up.    Constipation with mild nausea  Initiate  bowel regimen, Zofran when necessary for nausea. Exam is stable abdomen is soft.      Code Status: Full  Family Communication:    Disposition Plan: SNF   Procedures     Consults  Cards, Hand Surgery   Medications  Scheduled Meds: . diltiazem  120 mg Oral Q12H  . doxycycline (VIBRAMYCIN) IV  100 mg Intravenous Q12H  . feeding supplement (RESOURCE BREEZE)  1 Container Oral BID BM  . ferrous sulfate  325 mg Oral TID WC  . insulin aspart  0-5 Units Subcutaneous QHS  . insulin aspart  0-9 Units Subcutaneous TID WC  . metoprolol tartrate  50 mg Oral BID  . pantoprazole  40 mg Oral Daily  . silver sulfADIAZINE   Topical BID  . vitamin B-12  1,000 mcg Oral Daily  . warfarin  2.5 mg Oral Custom  . [START ON 02/26/2013] warfarin  5 mg Oral Custom  . Warfarin - Pharmacist Dosing Inpatient   Does not apply q1800   Continuous Infusions:  PRN Meds:.acetaminophen, fentaNYL,  lidocaine-prilocaine, ondansetron (ZOFRAN) IV  DVT Prophylaxis  Coumadin  Lab Results  Component Value Date   INR 2.91* 02/25/2013   INR 2.53* 02/24/2013   INR 2.26* 02/23/2013     Lab Results  Component Value Date   PLT 611* 02/25/2013    Antibiotics    Anti-infectives   Start     Dose/Rate Route Frequency Ordered Stop   02/23/13 1030  doxycycline (VIBRAMYCIN) 100 mg in dextrose 5 % 250 mL IVPB     100 mg 125 mL/hr over 120 Minutes Intravenous Every 12 hours 02/23/13 0919     02/22/13 2200  doxycycline (VIBRA-TABS) tablet 100 mg  Status:  Discontinued     100 mg Oral Every 12 hours 02/22/13 1430 02/23/13 0919   02/22/13 1400  doxycycline (VIBRAMYCIN) 100 mg in dextrose 5 % 250 mL IVPB  Status:  Discontinued     100 mg 125 mL/hr over 120 Minutes Intravenous Every 12 hours 02/22/13 1315 02/22/13 1430   02/18/13 2000  vancomycin (VANCOCIN) IVPB 750 mg/150 ml premix  Status:  Discontinued     750 mg 150 mL/hr over 60 Minutes Intravenous Every 48 hours 02/18/13 1840 02/19/13 0941   02/18/13 0945   cefTRIAXone (ROCEPHIN) 1 g in dextrose 5 % 50 mL IVPB  Status:  Discontinued     1 g 100 mL/hr over 30 Minutes Intravenous Every 24 hours 02/18/13 0933 02/20/13 0644          Subjective:   Akshaya Kraemer today has, No headache, No chest pain, No abdominal pain , No new weakness tingling or numbness, No Cough - SOB. Mild L arm pain which has improved, today is mildly constipated and mild nausea  Objective:   Filed Vitals:   02/24/13 0502 02/24/13 1335 02/24/13 1919 02/25/13 0422  BP: 119/54 110/64 137/66 136/57  Pulse: 85 70 101 76  Temp: 98.8 F (37.1 C) 97.7 F (36.5 C) 98 F (36.7 C) 97.5 F (36.4 C)  TempSrc: Oral Oral Oral Oral  Resp: 18 16 18 18   Weight:      SpO2: 97% 98% 96% 100%    Wt Readings from Last 3 Encounters:  02/22/13 51.4 kg (113 lb 5.1 oz)     Intake/Output Summary (Last 24 hours) at 02/25/13 0931 Last data filed at 02/25/13 0843  Gross per 24 hour  Intake    220 ml  Output      0 ml  Net    220 ml    Exam Awake Alert, Oriented X 3, No new F.N deficits, Normal affect [place].AT,PERRAL Supple Neck,No JVD, No cervical lymphadenopathy appriciated.  Symmetrical Chest wall movement, Good air movement bilaterally, CTAB RRR,No Gallops,Rubs or new Murmurs, No Parasternal Heave +ve B.Sounds, Abd Soft, Non tender, No organomegaly appriciated, No rebound - guarding or rigidity. No Cyanosis, Clubbing or edema, No new Rash or bruise L arm is swollen elbow to hand, large Welp under the cubital fossa, skin has some chemical burn -bandage in place, good sensation in the fingers.   Data Review   Micro Results Recent Results (from the past 240 hour(s))  MRSA PCR SCREENING     Status: None   Collection Time    02/18/13 11:10 AM      Result Value Range Status   MRSA by PCR NEGATIVE  NEGATIVE Final   Comment:            The GeneXpert MRSA Assay (FDA     approved for NASAL specimens  only), is one component of a     comprehensive MRSA colonization      surveillance program. It is not     intended to diagnose MRSA     infection nor to guide or     monitor treatment for     MRSA infections.  CULTURE, BLOOD (ROUTINE X 2)     Status: None   Collection Time    02/18/13  4:30 PM      Result Value Range Status   Specimen Description BLOOD LEFT HAND   Final   Special Requests BOTTLES DRAWN AEROBIC ONLY 8CC   Final   Culture  Setup Time     Final   Value: 02/19/2013 00:24     Performed at [facility]   Culture     Final   Value: NO GROWTH 5 DAYS     Performed at [facility]   Report Status 02/25/2013 FINAL   Final  CULTURE, BLOOD (ROUTINE X 2)     Status: None   Collection Time    02/18/13  4:50 PM      Result Value Range Status   Specimen Description BLOOD RIGHT HAND   Final   Special Requests BOTTLES DRAWN AEROBIC ONLY 2CC   Final   Culture  Setup Time     Final   Value: 02/19/2013 00:24     Performed at [facility]   Culture     Final   Value: NO GROWTH 5 DAYS     Performed at [facility]   Report Status 02/25/2013 FINAL   Final  URINE CULTURE     Status: None   Collection Time    02/19/13  9:29 AM      Result Value Range Status   Specimen Description URINE, RANDOM   Final   Special Requests NONE   Final   Culture  Setup Time     Final   Value: 02/19/2013 10:13     Performed at [facility] Count     Final   Value: NO GROWTH     Performed at [facility]   Culture     Final   Value: NO GROWTH     Performed at [facility]   Report Status 02/20/2013 FINAL   Final    Radiology Reports Ct Abdomen Pelvis W Contrast  02/15/2013   CLINICAL DATA:  Left lower quadrant abdominal pain and tenderness, worse over the last 2 days, history of prior surgery for small bowel obstruction, history of right breast carcinoma in 2008  EXAM: CT ABDOMEN AND PELVIS WITH CONTRAST  TECHNIQUE: Multidetector CT imaging of the abdomen and pelvis was performed using the standard  protocol following bolus administration of intravenous contrast.  CONTRAST:  OMNIPAQUE IOHEXOL 300 MG/ML  SOLN  COMPARISON:  CT abdomen pelvis of 05/24/2008  FINDINGS: The lung bases are clear other than linear scarring in the periphery of the right lower lobe. The liver enhances with no focal abnormality and no ductal dilatation is seen. No calcified gallstones are noted. The pancreas appears somewhat atrophic with minimal prominence of the pancreatic duct. The adrenal glands and spleen are unremarkable. The configuration of the gastroesophageal junction is atypical which possibly is due to prior surgery and gastric fundoplication the kidneys enhance with no calculus or mass and the pelvocaliceal systems are unremarkable on delayed images. The abdominal aorta is normal in caliber with moderate atheromatous change present. No  adenopathy is seen. A tiny midline ventral hernia is present containing only a small amount of fat just above the umbilicus.  In this elderly patient the uterus is lobular and enlarged probably due to a large degenerating uterine fibroid. A uterine malignancy would be difficult to exclude. A pelvic ultrasound may be helpful to assess further. Also there is a cystic structure anteriorly in the left pelvis of approximately 2.6 cm in diameter. Ultrasound may also be helpful in assessing this possible adnexal cyst. No free fluid is seen within the pelvis. The urinary bladder is unremarkable. Both large and small bowel gas is present without obstruction. The appendix and terminal ileum are unremarkable. There is some exaggeration of lumbar lordosis. An old partial compression deformity of T12 is noted with degenerative change. Also there is slight anterolisthesis of L4 on L5 by 4 mm and L5 on S1 by 5 mm most likely degenerative in origin.  IMPRESSION: 1. Abnormal appearance of the uterus may be due to a large degenerating uterine fibroid, but a malignancy would be difficult to exclude.  Correlate clinically and consider ultrasound or MRI to assess further. Also question 2.6 cm anterior left adnexal cyst. 2. Abnormality of the gastric fundus may be due to prior gastric fundoplication. Correlate clinically. 3. Slight anterolisthesis of L4 on L5 and L5 on S1 most likely degenerative in origin. 4. The appendix and terminal ileum appear normal.   Electronically Signed   By: Dwyane Dee M.D.   On: 02/15/2013 15:20   Dg Chest Port 1 View  02/18/2013   CLINICAL DATA:  Chest pain  EXAM: PORTABLE CHEST - 1 VIEW  COMPARISON:  05/22/2007  FINDINGS: Chronic cardiopericardial enlargement. Aortic atherosclerosis. Chronic interstitial coarsening and hyperinflation. No edema, effusion, or infiltrate. Mottled lucency overlapping the left shoulder is likely gas trapped underneath a pad. No evidence of pneumothorax.  IMPRESSION: Cardiomegaly and COPD.  No evidence of active disease.   Electronically Signed   By: Tiburcio Pea M.D.   On: 02/18/2013 06:27    CBC  Recent Labs Lab 02/18/13 1701 02/19/13 0500 02/21/13 0740 02/23/13 0545 02/24/13 0431 02/25/13 0553  WBC 22.3* 23.1* 17.0* 14.8* 14.6* 14.5*  HGB 10.1* 9.9* 8.5* 9.0* 8.9* 9.3*  HCT 30.4* 30.3* 26.4* 28.3* 28.1* 28.5*  PLT 678* 606* 526* 624* 613* 611*  MCV 89.1 88.1 88.6 89.0 89.2 87.7  MCH 29.6 28.8 28.5 28.3 28.3 28.6  MCHC 33.2 32.7 32.2 31.8 31.7 32.6  RDW 16.1* 16.3* 16.6* 16.5* 16.7* 16.6*  LYMPHSABS 1.5  --   --   --   --   --   MONOABS 3.1*  --   --   --   --   --   EOSABS 0.0  --   --   --   --   --   BASOSABS 0.0  --   --   --   --   --     Chemistries   Recent Labs Lab 02/20/13 0715 02/21/13 0740 02/23/13 0545 02/24/13 0431 02/25/13 0553  NA 132* 132* 136 134* 134*  K 4.5 4.1 3.6 3.4* 4.0  CL 101 101 103 103 103  CO2 19 17* 18* 18* 18*  GLUCOSE 91 80 93 150* 103*  BUN 35* 29* 18 14 11   CREATININE 1.90* 1.26* 0.84 0.80 0.73  CALCIUM 8.0* 7.9* 8.3* 8.0* 8.0*    ------------------------------------------------------------------------------------------------------------------ CrCl is unknown because there is no height on file for the current visit. ------------------------------------------------------------------------------------------------------------------ No results found for this  basename: HGBA1C,  in the last 72 hours ------------------------------------------------------------------------------------------------------------------ No results found for this basename: CHOL, HDL, LDLCALC, TRIG, CHOLHDL, LDLDIRECT,  in the last 72 hours ------------------------------------------------------------------------------------------------------------------ No results found for this basename: TSH, T4TOTAL, FREET3, T3FREE, THYROIDAB,  in the last 72 hours ------------------------------------------------------------------------------------------------------------------  Recent Labs  02/22/13 1445  FERRITIN 842*  TIBC Not calculated due to Iron <10.  IRON <10*    Coagulation profile  Recent Labs Lab 02/20/13 0715 02/21/13 1145 02/23/13 0545 02/24/13 0431 02/25/13 0553  INR 4.22* 3.06* 2.26* 2.53* 2.91*    No results found for this basename: DDIMER,  in the last 72 hours  Cardiac Enzymes  Recent Labs Lab 02/18/13 1534 02/19/13 0500  TROPONINI <0.30 <0.30   ------------------------------------------------------------------------------------------------------------------ No components found with this basename: POCBNP,      Time Spent in minutes  35   SINGH,PRASHANT K M.D on 02/25/2013 at 9:31 AM  Between 7am to 7pm - Pager - [phone]  After 7pm go to [email or web address] - password [email or web address]  And look for the night coverage person covering for me after hours  [facility] Office  [phone]

## 2013-02-26 LAB — PROTIME-INR
INR: 3.31 — ABNORMAL HIGH (ref 0.00–1.49)
Prothrombin Time: 32.4 seconds — ABNORMAL HIGH (ref 11.6–15.2)

## 2013-02-26 LAB — GLUCOSE, CAPILLARY
Glucose-Capillary: 86 mg/dL (ref 70–99)
Glucose-Capillary: 91 mg/dL (ref 70–99)
Glucose-Capillary: 125 mg/dL — ABNORMAL HIGH (ref 70–99)

## 2013-02-26 MED ORDER — WARFARIN SODIUM 5 MG PO TABS: 2.5000 mg | ORAL_TABLET | Freq: Every day | ORAL | Status: DC

## 2013-02-26 MED ORDER — DOXYCYCLINE HYCLATE 50 MG PO CAPS: 100.0000 mg | ORAL_CAPSULE | Freq: Two times a day (BID) | ORAL | Status: DC

## 2013-02-26 MED ORDER — SILVER SULFADIAZINE 1 % EX CREA: TOPICAL_CREAM | Freq: Two times a day (BID) | CUTANEOUS | Status: DC

## 2013-02-26 MED ORDER — DILTIAZEM HCL 120 MG PO TABS: 120.0000 mg | ORAL_TABLET | Freq: Two times a day (BID) | ORAL | Status: DC

## 2013-02-26 MED ORDER — BOOST / RESOURCE BREEZE PO LIQD: 1.0000 | Freq: Two times a day (BID) | ORAL | Status: DC

## 2013-02-26 MED ORDER — POLYETHYLENE GLYCOL 3350 17 G PO PACK: 17.0000 g | PACK | Freq: Two times a day (BID) | ORAL | Status: DC | PRN

## 2013-02-26 MED ORDER — SILVER SULFADIAZINE 1 % EX CREA
TOPICAL_CREAM | Freq: Every day | CUTANEOUS | Status: DC
  Filled 2013-02-26: qty 85

## 2013-02-26 MED ORDER — METOPROLOL TARTRATE 50 MG PO TABS: 75.0000 mg | ORAL_TABLET | Freq: Two times a day (BID) | ORAL | Status: DC

## 2013-02-26 NOTE — Consult Note (Signed)
Wound care follow-up: Pt had consult performed by hand surgeon, refer to assessment and plan of care on 10/24.  Left hand and fingers with visibly decreased amt edema.  Movement and sensation intact.  Left arm remains hard to palpation with raised area near inner elbow.  4.5X5X.1cm wound where previous blister has ruptured and evolved from infiltrate site.  Wound 90% red, 10% black.  No odor, mod amt tan drainage.  Less painful to touch.  Continue present plan of care with Silvadene and non-adherent dressing to promote healing and recommend follow-up at the outpatient wound care center after discharge. Please re-consult if further assistance is needed.  Thank-you,  Cammie Mcgee MSN, RN, CWOCN, [place], CNS [phone]

## 2013-02-26 NOTE — Progress Notes (Signed)
ANTICOAGULATION CONSULT NOTE - Follow Up Consult  Pharmacy Consult for Coumadin Indication: atrial fibrillation  No Known Allergies  Patient Measurements: Weight: 113 lb 5.1 oz (51.4 kg) Heparin Dosing Weight:    Vital Signs: Temp: 98.3 F (36.8 C) (10/27 0752) Temp src: Oral (10/27 0752) BP: 137/64 mmHg (10/27 0752) Pulse Rate: 109 (10/27 0752)  Labs:  Recent Labs  02/24/13 0431 02/25/13 0553 02/26/13 0400  HGB 8.9* 9.3*  --   HCT 28.1* 28.5*  --   PLT 613* 611*  --   LABPROT 26.4* 29.4* 32.4*  INR 2.53* 2.91* 3.31*  CREATININE 0.80 0.73  --     CrCl is unknown because there is no height on file for the current visit.   Assessment: 79 y.o. female admitted 02/18/2013 with cardiac arrest. Patient with history of afib on coumadin PTA and to continue therapy as inpatient. INR today is 2.31 with trend up (possible due to doxycycline and also noted with minimal dietary intake).   Home coumadin dose: 5mg  on Mon/Wed and 2.5mg  TTFSS   Goal of Therapy:  INR 2-3 Monitor platelets by anticoagulation protocol: Yes   Plan:  -Hold coumadin today -Daily PT/INR  Harland German, Pharm D 02/26/2013 10:01 AM

## 2013-02-26 NOTE — Progress Notes (Addendum)
10:40 AM: CSW called patient's daughter to inform her of dc date being today. Daughter states that is fine and she will bring patient clothes to the facility.  CSW received report that patient is ready for dc. CSW contacted [employer] and is awaiting phone call back.   Maree Krabbe, MSW, [facility]ors [phone]

## 2013-02-26 NOTE — Progress Notes (Signed)
Patient taken off the monitor and IV D/C'D. Called and gave report to [facility] where patient will be going. Patient stable. Patient discharged to facility via ambulance. Stanton Kidney R

## 2013-02-26 NOTE — Discharge Summary (Signed)
[facility]                                                                                   Lisa Nixon, is a 79 y.o. female  DOB March 01, 1923  MRN [id number].  Admission date:  02/18/2013  Admitting Physician  Pamella Pert, MD  Discharge Date:  02/26/2013   Primary MD  Pamella Pert, MD  Recommendations for primary care physician for things to follow:   Monitor CBC BMP INR closely, please make sure patient follows with cardiology and OB within a week   Admission Diagnosis  Hyperkalemia [276.7] Bradycardia [427.89] Hypotension [458.9] Hypothermia, initial encounter [991.6] Digitalis toxicity, initial encounter [972.1, E980.4]  Discharge Diagnosis   dysrhythmia, digitalis toxicity, chemical injury to the left arm due to dopamine infiltration  Principal Problem:   Left arm cellulitis Active Problems:   Protein-calorie malnutrition, severe   Atrial fibrillation with RVR   History of pulmonary embolism   Myalgia and myositis   Leukocytosis, unspecified   Thrombocytosis   Anemia   Acute renal failure   Diabetes   Uterine fibroid      Past Medical History  Diagnosis Date  . Atrial fibrillation   . Hyperlipemia   . GERD (gastroesophageal reflux disease)   . Hypertension   . Breast cancer   . Angina at rest     Past Surgical History  Procedure Laterality Date  . Breast surgery    . Lymphadenectomy    . Hiatal hernia repair    . Cesarean section       Discharge Condition: Stable       Follow-up Information   Follow up with Pamella Pert, MD. Schedule an appointment as soon as possible for a visit in 3 days.   Specialty:  Cardiology   Contact information:   [address] [place] [address] [phone]       Follow up with [facility] physician of choice. Schedule an appointment as soon as possible for a visit in 1 week. (Uterine fibroid)         Consults obtained - cardiology Dr. Jacinto Halim, hand surgery, wound  care   Discharge Medications      Medication List    STOP taking these medications       atorvastatin 20 MG tablet  Commonly known as:  LIPITOR     digoxin 0.125 MG tablet  Commonly known as:  LANOXIN     diltiazem 300 MG 24 hr capsule  Commonly known as:  TIAZAC     lisinopril 20 MG tablet  Commonly known as:  PRINIVIL,ZESTRIL     propranolol 40 MG tablet  Commonly known as:  INDERAL      TAKE these medications       diltiazem 120 MG tablet  Commonly known as:  CARDIZEM  Take 1 tablet (120 mg total) by mouth every 12 (twelve) hours.     doxycycline 50 MG capsule  Commonly known as:  VIBRAMYCIN  Take 2 capsules (100 mg total) by mouth 2 (two) times daily. For 5 more days     feeding supplement (RESOURCE BREEZE) Liqd  Take 1 Container by  mouth 2 (two) times daily between meals.     INTEGRA PO  Take 1 capsule by mouth daily.     metoprolol 50 MG tablet  Commonly known as:  LOPRESSOR  Take 1.5 tablets (75 mg total) by mouth 2 (two) times daily.     mirtazapine 15 MG tablet  Commonly known as:  REMERON  Take 15 mg by mouth at bedtime.     oxyCODONE-acetaminophen 7.5-325 MG per tablet  Commonly known as:  PERCOCET  Take 1 tablet by mouth every 8 (eight) hours as needed for pain.     pantoprazole 40 MG tablet  Commonly known as:  PROTONIX  Take 40 mg by mouth daily.     polyethylene glycol packet  Commonly known as:  MIRALAX / GLYCOLAX  Take 17 g by mouth 2 (two) times daily as needed (Constipation).     potassium chloride SA 20 MEQ tablet  Commonly known as:  K-DUR,KLOR-CON  Take 20 mEq by mouth daily.     silver sulfADIAZINE 1 % cream  Commonly known as:  SILVADENE  Apply topically 2 (two) times daily.     vitamin B-12 1000 MCG tablet  Commonly known as:  CYANOCOBALAMIN  Take 1,000 mcg by mouth daily.     warfarin 5 MG tablet  Commonly known as:  COUMADIN  Take 0.5-1 tablets (2.5-5 mg total) by mouth daily. Skip today's dose then take 5mg  on  Mondays and Wednesdays and takes 2.5mg  on all other days close outpatient INR monitoring at nursing home         Diet and Activity recommendation: See Discharge Instructions below   Discharge Instructions     Follow with Primary MD Pamella Pert, MD in 3 days   Follow with your PCP and OB MD for possible Fibroid in your Uterus  Get CBC, CMP, INR checked 2 days by nursing home M.D.   Get Medicines reviewed and adjusted.  Please request your Prim.MD to go over all Hospital Tests and Procedure/Radiological results at the follow up, please get all Hospital records sent to your Prim MD by signing hospital release before you go home.  Activity: Dysphagia 3 - As tolerated with Full fall precautions use walker/cane & assistance as needed   Diet:  Heart Healthy - Low Carbohydrate ,  Fluid restriction 1.8 lit/day, Aspiration precautions.  For Heart failure patients - Check your Weight same time everyday, if you gain over 2 pounds, or you develop in leg swelling, experience more shortness of breath or chest pain, call your Primary MD immediately. Follow Cardiac Low Salt Diet and 1.8 lit/day fluid restriction.  Disposition Home    If you experience worsening of your admission symptoms, develop shortness of breath, life threatening emergency, suicidal or homicidal thoughts you must seek medical attention immediately by calling 911 or calling your MD immediately  if symptoms less severe.  You Must read complete instructions/literature along with all the possible adverse reactions/side effects for all the Medicines you take and that have been prescribed to you. Take any new Medicines after you have completely understood and accpet all the possible adverse reactions/side effects.   Do not drive and provide baby sitting services if your were admitted for syncope or siezures until you have seen by Primary MD or a Neurologist and advised to do so again.  Do not drive when taking Pain  medications.    Do not take more than prescribed Pain, Sleep and Anxiety Medications  Special Instructions: If you have smoked  or chewed Tobacco  in the last 2 yrs please stop smoking, stop any regular Alcohol  and or any Recreational drug use.  Wear Seat belts while driving.   Please note  You were cared for by a hospitalist during your hospital stay. If you have any questions about your discharge medications or the care you received while you were in the hospital after you are discharged, you can call the unit and asked to speak with the hospitalist on call if the hospitalist that took care of you is not available. Once you are discharged, your primary care physician will handle any further medical issues. Please note that NO REFILLS for any discharge medications will be authorized once you are discharged, as it is imperative that you return to your primary care physician (or establish a relationship with a primary care physician if you do not have one) for your aftercare needs so that they can reassess your need for medications and monitor your lab values.    Major procedures and Radiology Reports - PLEASE review detailed and final reports for all details, in brief -    Echo  - Left ventricle: The cavity size was normal. Wall thickness was increased in a pattern of mild LVH. Systolic function was normal. The estimated ejection fraction was in the range of 60% to 65%. The study is not technically sufficient to allow evaluation of LV diastolic function. - Aortic valve: Mildly calcified annulus. Trileaflet. Cusp separation was mildly reduced. - Mitral valve: Calcified annulus. - Left atrium: The atrium was severely dilated. - Atrial septum: No defect or patent foramen ovale was identified. - Pulmonary arteries: PA peak pressure: 34mm Hg (S).         Ct Abdomen Pelvis W Contrast  02/15/2013   CLINICAL DATA:  Left lower quadrant abdominal pain and tenderness, worse over the last 2  days, history of prior surgery for small bowel obstruction, history of right breast carcinoma in 2008  EXAM: CT ABDOMEN AND PELVIS WITH CONTRAST  TECHNIQUE: Multidetector CT imaging of the abdomen and pelvis was performed using the standard protocol following bolus administration of intravenous contrast.  CONTRAST:  OMNIPAQUE IOHEXOL 300 MG/ML  SOLN  COMPARISON:  CT abdomen pelvis of 05/24/2008  FINDINGS: The lung bases are clear other than linear scarring in the periphery of the right lower lobe. The liver enhances with no focal abnormality and no ductal dilatation is seen. No calcified gallstones are noted. The pancreas appears somewhat atrophic with minimal prominence of the pancreatic duct. The adrenal glands and spleen are unremarkable. The configuration of the gastroesophageal junction is atypical which possibly is due to prior surgery and gastric fundoplication the kidneys enhance with no calculus or mass and the pelvocaliceal systems are unremarkable on delayed images. The abdominal aorta is normal in caliber with moderate atheromatous change present. No adenopathy is seen. A tiny midline ventral hernia is present containing only a small amount of fat just above the umbilicus.  In this elderly patient the uterus is lobular and enlarged probably due to a large degenerating uterine fibroid. A uterine malignancy would be difficult to exclude. A pelvic ultrasound may be helpful to assess further. Also there is a cystic structure anteriorly in the left pelvis of approximately 2.6 cm in diameter. Ultrasound may also be helpful in assessing this possible adnexal cyst. No free fluid is seen within the pelvis. The urinary bladder is unremarkable. Both large and small bowel gas is present without obstruction. The appendix and terminal  ileum are unremarkable. There is some exaggeration of lumbar lordosis. An old partial compression deformity of T12 is noted with degenerative change. Also there is slight  anterolisthesis of L4 on L5 by 4 mm and L5 on S1 by 5 mm most likely degenerative in origin.  IMPRESSION: 1. Abnormal appearance of the uterus may be due to a large degenerating uterine fibroid, but a malignancy would be difficult to exclude. Correlate clinically and consider ultrasound or MRI to assess further. Also question 2.6 cm anterior left adnexal cyst. 2. Abnormality of the gastric fundus may be due to prior gastric fundoplication. Correlate clinically. 3. Slight anterolisthesis of L4 on L5 and L5 on S1 most likely degenerative in origin. 4. The appendix and terminal ileum appear normal.   Electronically Signed   By: Dwyane Dee M.D.   On: 02/15/2013 15:20   Dg Chest Port 1 View  02/18/2013   CLINICAL DATA:  Chest pain  EXAM: PORTABLE CHEST - 1 VIEW  COMPARISON:  05/22/2007  FINDINGS: Chronic cardiopericardial enlargement. Aortic atherosclerosis. Chronic interstitial coarsening and hyperinflation. No edema, effusion, or infiltrate. Mottled lucency overlapping the left shoulder is likely gas trapped underneath a pad. No evidence of pneumothorax.  IMPRESSION: Cardiomegaly and COPD.  No evidence of active disease.   Electronically Signed   By: Tiburcio Pea M.D.   On: 02/18/2013 06:27    Micro Results      Recent Results (from the past 240 hour(s))  MRSA PCR SCREENING     Status: None   Collection Time    02/18/13 11:10 AM      Result Value Range Status   MRSA by PCR NEGATIVE  NEGATIVE Final   Comment:            The GeneXpert MRSA Assay (FDA     approved for NASAL specimens     only), is one component of a     comprehensive MRSA colonization     surveillance program. It is not     intended to diagnose MRSA     infection nor to guide or     monitor treatment for     MRSA infections.  CULTURE, BLOOD (ROUTINE X 2)     Status: None   Collection Time    02/18/13  4:30 PM      Result Value Range Status   Specimen Description BLOOD LEFT HAND   Final   Special Requests BOTTLES DRAWN  AEROBIC ONLY 8CC   Final   Culture  Setup Time     Final   Value: 02/19/2013 00:24     Performed at [facility]   Culture     Final   Value: NO GROWTH 5 DAYS     Performed at [facility]   Report Status 02/25/2013 FINAL   Final  CULTURE, BLOOD (ROUTINE X 2)     Status: None   Collection Time    02/18/13  4:50 PM      Result Value Range Status   Specimen Description BLOOD RIGHT HAND   Final   Special Requests BOTTLES DRAWN AEROBIC ONLY 2CC   Final   Culture  Setup Time     Final   Value: 02/19/2013 00:24     Performed at [facility]   Culture     Final   Value: NO GROWTH 5 DAYS     Performed at [facility]   Report Status 02/25/2013 FINAL   Final  URINE CULTURE  Status: None   Collection Time    02/19/13  9:29 AM      Result Value Range Status   Specimen Description URINE, RANDOM   Final   Special Requests NONE   Final   Culture  Setup Time     Final   Value: 02/19/2013 10:13     Performed at [facility] Count     Final   Value: NO GROWTH     Performed at [facility]   Culture     Final   Value: NO GROWTH     Performed at [facility]   Report Status 02/20/2013 FINAL   Final     History of present illness and  Hospital Course:     Kindly see H&P for history of present illness and admission details, please review complete Labs, Consult reports and Test reports for all details in brief Lisa Nixon, is a 79 y.o. female, patient with history of  history of atrial fibrillation, PE on Coumadin, pink every malnutrition moderate, anemia of chronic disease, reactionary thrombocytosis, pre diabetes, was admitted to the hospital by the cardiology team for altered mental status secondary to bradycardia caused by digitalis toxicity, she was also found to be hypotensive, she was visually kept on IV dopamine however dopamine infiltrated in her left arm causing chemical burn.    She was treated by wound care  team, digitalis toxicity in due time resolved, she is now been placed on present dose beta blocker and Cardizem by the cardiology team. Her rate control is acceptable per the cardiology team, she will be discharged on below mentioned doses of beta blocker and Cardizem, digitalis has been stopped, she is on Coumadin for atrial fibrillation INR today is over 3 and I will request nursing home to skip today's Coumadin dose. Thereafter home dose Coumadin to be resumed with close INR monitoring.    For her left arm chemical burn she has been seen by wound care team instructions below, she will require continued wound care and will require her left arm to be kept clean and dry at all times. She was seen here by Dr. Janee Morn hand surgeon who recommended continued wound care only at this time. Her left arm actually looks much improved. There was a question of mild surrounding cellulitis hence she has been placed on doxycycline for 4 more days.   Wound care team instruction  " Continue present plan of care as follows to assist with removal of nonviable tissue:  Apply Silvadene cream to left arm Q day. Cover with non-adherent dressing and secure with kerlex. Gently wipe off previous Silvadene with moist gauze before applying more every day".   Once her heart issues were resolved she was transferred to the hospitalist service 4 days ago by the cardiology team, she is now hemodynamically stable.   Also has history of PE and Coumadin for that will be continued as above, skip today's Coumadin dose as INR was over 3.   Has history of dyslipidemia however upon admission she had lot of myalgias with normal CK levels, cardiology had discontinued her statin. Post discharge she will follow with her primary cardiologist and get this addressed.   Moderate protein calorie malnutrition and generalized weakness. Continue dietary/protein supplementation along with physical therapy at rehabilitation facility.   Today  patient is somewhat constipated, despite bowel regimen last BM was over 5 days ago, she will be manually disimpacted and she will get a soapsuds  enema prior to discharge, will continue as needed bowel regimen post discharge also.   Of note on CT scan of abdomen pelvis during admission there was a incidental finding of possible uterine fibroid versus adnexal mass I have recommended one time OB followup post discharge within a week     Today   Subjective:   Lisa Nixon today has no headache,no chest abdominal pain,no new weakness tingling or numbness, feels much better, does have mild constipation.  Objective:   Blood pressure 137/64, pulse 109, temperature 98.3 F (36.8 C), temperature source Oral, resp. rate 20, weight 51.4 kg (113 lb 5.1 oz), SpO2 98.00%.   Intake/Output Summary (Last 24 hours) at 02/26/13 1034 Last data filed at 02/26/13 0600  Gross per 24 hour  Intake    790 ml  Output    250 ml  Net    540 ml    Exam Awake Alert, Oriented *3, No new F.N deficits, Normal affect [place].AT,PERRAL Supple Neck,No JVD, No cervical lymphadenopathy appriciated.  Symmetrical Chest wall movement, Good air movement bilaterally, CTAB RRR,No Gallops,Rubs or new Murmurs, No Parasternal Heave +ve B.Sounds, Abd Soft, Non tender, No organomegaly appriciated, No rebound -guarding or rigidity. No Cyanosis, Clubbing or edema, No new Rash or bruise, left arm in bandage skin appears much better, swelling has considerably gone down and almost completely resolved.  Data Review   CBC w Diff: Lab Results  Component Value Date   WBC 14.5* 02/25/2013   WBC 8.1 05/30/2006   HGB 9.3* 02/25/2013   HGB 12.1 05/30/2006   HCT 28.5* 02/25/2013   HCT 35.2 05/30/2006   PLT 611* 02/25/2013   PLT 384 05/30/2006   LYMPHOPCT 7* 02/18/2013   LYMPHOPCT 25.7 05/30/2006   MONOPCT 14* 02/18/2013   MONOPCT 16.7* 05/30/2006   EOSPCT 0 02/18/2013   EOSPCT 1.3 05/30/2006   BASOPCT 0 02/18/2013   BASOPCT 0.4  05/30/2006    CMP: Lab Results  Component Value Date   NA 134* 02/25/2013   K 4.0 02/25/2013   CL 103 02/25/2013   CO2 18* 02/25/2013   BUN 11 02/25/2013   CREATININE 0.73 02/25/2013   PROT 7.1 02/18/2013   ALBUMIN 2.0* 02/18/2013   BILITOT 0.4 02/18/2013   ALKPHOS 66 02/18/2013   AST 31 02/18/2013   ALT 15 02/18/2013  .   Total Time in preparing paper work, data evaluation and todays exam - 35 minutes  Leroy Sea M.D on 02/26/2013 at 10:34 AM  [facility] Office  [phone]

## 2013-02-26 NOTE — Progress Notes (Signed)
Disimpaction successful. Patient disimpacted followed by soap suds enema. Patient produced a large black/brown bowel movement. BP remained stable 130's. Patient stable and relieved to finally have a BM. Stanton Kidney R

## 2013-02-26 NOTE — Progress Notes (Signed)
Clinical Social Work Department CLINICAL SOCIAL WORK PLACEMENT NOTE 02/26/2013  Patient:  Lisa Nixon, Lisa Nixon  Account Number:  [id number] Admit date:  02/18/2013  Clinical Social Worker:  Carren Rang  Date/time:  02/21/2013 03:03 PM  Clinical Social Work is seeking post-discharge placement for this patient at the following level of care:   SKILLED NURSING   (*CSW will update this form in Epic as items are completed)   02/21/2013  Patient/family provided with [facility] Department of Clinical Social Work's list of facilities offering this level of care within the geographic area requested by the patient (or if unable, by the patient's family).  02/21/2013  Patient/family informed of their freedom to choose among providers that offer the needed level of care, that participate in Medicare, Medicaid or managed care program needed by the patient, have an available bed and are willing to accept the patient.  02/21/2013  Patient/family informed of [facility]' ownership interest in [facility], as well as of the fact that they are under no obligation to receive care at this facility.  PASARR submitted to EDS on 02/21/2013 PASARR number received from [employer] on 02/21/2013  FL2 transmitted to all facilities in geographic area requested by pt/family on  02/21/2013 FL2 transmitted to all facilities within larger geographic area on   Patient informed that his/her managed care company has contracts with or will negotiate with  certain facilities, including the following:     Patient/family informed of bed offers received:  02/22/2013 Patient chooses bed at [facility] Physician recommends and patient chooses bed at    Patient to be transferred to [facility] on  02/26/2013 Patient to be transferred to facility by EMS  The following physician request were entered in Epic:   Additional Comments:  Maree Krabbe, MSW, [employer] [phone]

## 2013-02-26 NOTE — Progress Notes (Signed)
Clinical Social Worker facilitated patient discharge by contacting the patient, family and facility, [facility]. Patient agreeable to this plan and arranging transport via EMS . CSW arranging for transportation at 1:00 pm. CSW will sign off, as social work intervention is no longer needed.  Maree Krabbe, MSW, [facility] [phone]

## 2013-03-23 ENCOUNTER — Emergency Department (HOSPITAL_COMMUNITY): Payer: Medicare FFS

## 2013-03-23 ENCOUNTER — Encounter (HOSPITAL_COMMUNITY): Payer: Self-pay | Admitting: Emergency Medicine

## 2013-03-23 ENCOUNTER — Inpatient Hospital Stay (HOSPITAL_COMMUNITY)
Admission: EM | Admit: 2013-03-23 | Discharge: 2013-03-28 | DRG: 640 | Disposition: A | Discharge status: Home Health | Payer: Medicare FFS | Attending: Internal Medicine | Admitting: Internal Medicine

## 2013-03-23 DIAGNOSIS — K5641 Fecal impaction: Secondary | ICD-10-CM | POA: Diagnosis present

## 2013-03-23 DIAGNOSIS — Z853 Personal history of malignant neoplasm of breast: Secondary | ICD-10-CM

## 2013-03-23 DIAGNOSIS — E43 Unspecified severe protein-calorie malnutrition: Secondary | ICD-10-CM | POA: Diagnosis present

## 2013-03-23 DIAGNOSIS — E119 Type 2 diabetes mellitus without complications: Secondary | ICD-10-CM | POA: Diagnosis present

## 2013-03-23 DIAGNOSIS — E1169 Type 2 diabetes mellitus with other specified complication: Secondary | ICD-10-CM | POA: Diagnosis present

## 2013-03-23 DIAGNOSIS — K5909 Other constipation: Secondary | ICD-10-CM | POA: Diagnosis present

## 2013-03-23 DIAGNOSIS — E785 Hyperlipidemia, unspecified: Secondary | ICD-10-CM | POA: Diagnosis present

## 2013-03-23 DIAGNOSIS — D509 Iron deficiency anemia, unspecified: Secondary | ICD-10-CM | POA: Diagnosis present

## 2013-03-23 DIAGNOSIS — Z681 Body mass index (BMI) 19 or less, adult: Secondary | ICD-10-CM

## 2013-03-23 DIAGNOSIS — K219 Gastro-esophageal reflux disease without esophagitis: Secondary | ICD-10-CM | POA: Diagnosis present

## 2013-03-23 DIAGNOSIS — R109 Unspecified abdominal pain: Secondary | ICD-10-CM | POA: Diagnosis present

## 2013-03-23 DIAGNOSIS — J90 Pleural effusion, not elsewhere classified: Secondary | ICD-10-CM | POA: Diagnosis present

## 2013-03-23 DIAGNOSIS — R5381 Other malaise: Secondary | ICD-10-CM | POA: Diagnosis present

## 2013-03-23 DIAGNOSIS — K5900 Constipation, unspecified: Secondary | ICD-10-CM

## 2013-03-23 DIAGNOSIS — D649 Anemia, unspecified: Secondary | ICD-10-CM | POA: Diagnosis present

## 2013-03-23 DIAGNOSIS — Z7901 Long term (current) use of anticoagulants: Secondary | ICD-10-CM

## 2013-03-23 DIAGNOSIS — E8779 Other fluid overload: Secondary | ICD-10-CM | POA: Diagnosis present

## 2013-03-23 DIAGNOSIS — R188 Other ascites: Secondary | ICD-10-CM | POA: Diagnosis present

## 2013-03-23 DIAGNOSIS — E876 Hypokalemia: Principal | ICD-10-CM | POA: Diagnosis present

## 2013-03-23 DIAGNOSIS — I10 Essential (primary) hypertension: Secondary | ICD-10-CM | POA: Diagnosis present

## 2013-03-23 DIAGNOSIS — I4891 Unspecified atrial fibrillation: Secondary | ICD-10-CM | POA: Diagnosis present

## 2013-03-23 DIAGNOSIS — D72829 Elevated white blood cell count, unspecified: Secondary | ICD-10-CM | POA: Diagnosis present

## 2013-03-23 LAB — COMPREHENSIVE METABOLIC PANEL
ALT: 6 U/L (ref 0–35)
AST: 16 U/L (ref 0–37)
Alkaline Phosphatase: 58 U/L (ref 39–117)
BUN: 9 mg/dL (ref 6–23)
CO2: 22 mEq/L (ref 19–32)
Calcium: 9.2 mg/dL (ref 8.4–10.5)
Creatinine, Ser: 1.01 mg/dL (ref 0.50–1.10)
GFR calc Af Amer: 56 mL/min — ABNORMAL LOW (ref >90)
GFR calc non Af Amer: 48 mL/min — ABNORMAL LOW (ref >90)
Glucose, Bld: 122 mg/dL — ABNORMAL HIGH (ref 70–99)
Potassium: 2.6 mEq/L — CL (ref 3.5–5.1)
Sodium: 139 mEq/L (ref 135–145)
Total Bilirubin: 0.6 mg/dL (ref 0.3–1.2)
Total Protein: 6.3 g/dL (ref 6.0–8.3)

## 2013-03-23 LAB — URINALYSIS, ROUTINE W REFLEX MICROSCOPIC
Glucose, UA: NEGATIVE mg/dL
Hgb urine dipstick: NEGATIVE
Ketones, ur: NEGATIVE mg/dL
Nitrite: NEGATIVE
Specific Gravity, Urine: 1.022 (ref 1.005–1.030)

## 2013-03-23 LAB — URINE MICROSCOPIC-ADD ON

## 2013-03-23 LAB — CBC WITH DIFFERENTIAL/PLATELET
Eosinophils Absolute: 0 10*3/uL (ref 0.0–0.7)
Eosinophils Relative: 0 % (ref 0–5)
HCT: 31.5 % — ABNORMAL LOW (ref 36.0–46.0)
Hemoglobin: 9.9 g/dL — ABNORMAL LOW (ref 12.0–15.0)
Lymphocytes Relative: 14 % (ref 12–46)
Lymphs Abs: 1.7 10*3/uL (ref 0.7–4.0)
MCH: 28.9 pg (ref 26.0–34.0)
MCV: 91.8 fL (ref 78.0–100.0)
Monocytes Absolute: 0.9 10*3/uL (ref 0.1–1.0)
Monocytes Relative: 7 % (ref 3–12)
RBC: 3.43 MIL/uL — ABNORMAL LOW (ref 3.87–5.11)
WBC: 12.3 10*3/uL — ABNORMAL HIGH (ref 4.0–10.5)

## 2013-03-23 LAB — MAGNESIUM: Magnesium: 1.7 mg/dL (ref 1.5–2.5)

## 2013-03-23 MED ORDER — IOHEXOL 300 MG/ML  SOLN
50.0000 mL | Freq: Once | INTRAMUSCULAR | Status: AC | PRN
  Administered 2013-03-23: 50 mL via ORAL

## 2013-03-23 MED ORDER — ONDANSETRON HCL 4 MG PO TABS: 4.0000 mg | ORAL_TABLET | Freq: Four times a day (QID) | ORAL | Status: DC | PRN

## 2013-03-23 MED ORDER — POLYSACCHARIDE IRON COMPLEX 150 MG PO CAPS
150.0000 mg | ORAL_CAPSULE | Freq: Every day | ORAL | Status: DC
  Administered 2013-03-23 – 2013-03-28 (×6): 150 mg via ORAL
  Filled 2013-03-23 (×6): qty 1

## 2013-03-23 MED ORDER — SENNOSIDES-DOCUSATE SODIUM 8.6-50 MG PO TABS
2.0000 | ORAL_TABLET | Freq: Two times a day (BID) | ORAL | Status: DC
  Administered 2013-03-23 – 2013-03-28 (×8): 2 via ORAL
  Filled 2013-03-23 (×11): qty 2

## 2013-03-23 MED ORDER — POTASSIUM CHLORIDE CRYS ER 20 MEQ PO TBCR
40.0000 meq | EXTENDED_RELEASE_TABLET | ORAL | Status: DC
  Administered 2013-03-23: 40 meq via ORAL
  Filled 2013-03-23 (×2): qty 2

## 2013-03-23 MED ORDER — MIRTAZAPINE 15 MG PO TABS
15.0000 mg | ORAL_TABLET | Freq: Every day | ORAL | Status: DC
  Administered 2013-03-23 – 2013-03-27 (×5): 15 mg via ORAL
  Filled 2013-03-23 (×6): qty 1

## 2013-03-23 MED ORDER — POTASSIUM CHLORIDE CRYS ER 20 MEQ PO TBCR
40.0000 meq | EXTENDED_RELEASE_TABLET | Freq: Once | ORAL | Status: AC
  Administered 2013-03-23: 40 meq via ORAL
  Filled 2013-03-23: qty 2

## 2013-03-23 MED ORDER — MORPHINE SULFATE 2 MG/ML IJ SOLN
2.0000 mg | Freq: Once | INTRAMUSCULAR | Status: AC
  Administered 2013-03-23: 2 mg via INTRAVENOUS
  Filled 2013-03-23: qty 1

## 2013-03-23 MED ORDER — ACETAMINOPHEN 650 MG RE SUPP: 650.0000 mg | Freq: Four times a day (QID) | RECTAL | Status: DC | PRN

## 2013-03-23 MED ORDER — ONDANSETRON HCL 4 MG/2ML IJ SOLN
4.0000 mg | Freq: Once | INTRAMUSCULAR | Status: AC
  Administered 2013-03-23: 4 mg via INTRAVENOUS
  Filled 2013-03-23: qty 2

## 2013-03-23 MED ORDER — POLYETHYLENE GLYCOL 3350 17 G PO PACK
17.0000 g | PACK | Freq: Two times a day (BID) | ORAL | Status: DC | PRN
  Filled 2013-03-23: qty 1

## 2013-03-23 MED ORDER — SODIUM CHLORIDE 0.9 % IJ SOLN
3.0000 mL | Freq: Two times a day (BID) | INTRAMUSCULAR | Status: DC
  Administered 2013-03-24 – 2013-03-28 (×5): 3 mL via INTRAVENOUS

## 2013-03-23 MED ORDER — PANTOPRAZOLE SODIUM 40 MG PO TBEC
40.0000 mg | DELAYED_RELEASE_TABLET | Freq: Every day | ORAL | Status: DC
Start: 2013-03-23 — End: 2013-03-28
  Administered 2013-03-23 – 2013-03-28 (×6): 40 mg via ORAL
  Filled 2013-03-23 (×6): qty 1

## 2013-03-23 MED ORDER — SODIUM CHLORIDE 0.9 % IV SOLN
1000.0000 mL | INTRAVENOUS | Status: DC
  Administered 2013-03-23 – 2013-03-24 (×2): 1000 mL via INTRAVENOUS

## 2013-03-23 MED ORDER — WARFARIN SODIUM 2.5 MG PO TABS
2.5000 mg | ORAL_TABLET | Freq: Once | ORAL | Status: AC
  Administered 2013-03-23: 22:00:00 2.5 mg via ORAL
  Filled 2013-03-23: qty 1

## 2013-03-23 MED ORDER — SODIUM CHLORIDE 0.9 % IJ SOLN: 3.0000 mL | INTRAMUSCULAR | Status: DC | PRN

## 2013-03-23 MED ORDER — WARFARIN - PHARMACIST DOSING INPATIENT: Freq: Every day | Status: DC

## 2013-03-23 MED ORDER — SODIUM CHLORIDE 0.9 % IV BOLUS (SEPSIS)
500.0000 mL | Freq: Once | INTRAVENOUS | Status: AC
  Administered 2013-03-23: 23:00:00 500 mL via INTRAVENOUS

## 2013-03-23 MED ORDER — BOOST / RESOURCE BREEZE PO LIQD
1.0000 | Freq: Two times a day (BID) | ORAL | Status: DC
  Administered 2013-03-24: 11:00:00 1 via ORAL

## 2013-03-23 MED ORDER — ONDANSETRON HCL 4 MG/2ML IJ SOLN: 4.0000 mg | Freq: Four times a day (QID) | INTRAMUSCULAR | Status: DC | PRN

## 2013-03-23 MED ORDER — FLEET ENEMA 7-19 GM/118ML RE ENEM
1.0000 | ENEMA | Freq: Once | RECTAL | Status: AC
  Administered 2013-03-23: 1 via RECTAL
  Filled 2013-03-23: qty 1

## 2013-03-23 MED ORDER — SODIUM CHLORIDE 0.9 % IJ SOLN
3.0000 mL | Freq: Two times a day (BID) | INTRAMUSCULAR | Status: DC
  Administered 2013-03-24 – 2013-03-25 (×4): 3 mL via INTRAVENOUS

## 2013-03-23 MED ORDER — POTASSIUM CHLORIDE CRYS ER 20 MEQ PO TBCR
40.0000 meq | EXTENDED_RELEASE_TABLET | ORAL | Status: AC
  Administered 2013-03-23 – 2013-03-24 (×2): 40 meq via ORAL
  Filled 2013-03-23 (×2): qty 2

## 2013-03-23 MED ORDER — METOPROLOL TARTRATE 50 MG PO TABS
75.0000 mg | ORAL_TABLET | Freq: Two times a day (BID) | ORAL | Status: DC
  Administered 2013-03-23 – 2013-03-26 (×6): 75 mg via ORAL
  Filled 2013-03-23 (×7): qty 1

## 2013-03-23 MED ORDER — SODIUM CHLORIDE 0.9 % IV SOLN: 250.0000 mL | INTRAVENOUS | Status: DC | PRN

## 2013-03-23 MED ORDER — SODIUM CHLORIDE 0.9 % IV SOLN
1000.0000 mL | Freq: Once | INTRAVENOUS | Status: AC
  Administered 2013-03-23: 1000 mL via INTRAVENOUS

## 2013-03-23 MED ORDER — INTEGRA 62.5-62.5-40-3 MG PO CAPS: 1.0000 | ORAL_CAPSULE | Freq: Every day | ORAL | Status: DC

## 2013-03-23 MED ORDER — DILTIAZEM HCL 25 MG/5ML IV SOLN
10.0000 mg | Freq: Once | INTRAVENOUS | Status: AC
  Administered 2013-03-23: 10 mg via INTRAVENOUS
  Filled 2013-03-23: qty 5

## 2013-03-23 MED ORDER — ACETAMINOPHEN 325 MG PO TABS: 650.0000 mg | ORAL_TABLET | Freq: Four times a day (QID) | ORAL | Status: DC | PRN

## 2013-03-23 MED ORDER — POTASSIUM CHLORIDE 10 MEQ/100ML IV SOLN
10.0000 meq | INTRAVENOUS | Status: AC
  Administered 2013-03-23: 10 meq via INTRAVENOUS
  Filled 2013-03-23 (×3): qty 100

## 2013-03-23 MED ORDER — VITAMIN B-12 1000 MCG PO TABS
1000.0000 ug | ORAL_TABLET | Freq: Every day | ORAL | Status: DC
  Administered 2013-03-23 – 2013-03-28 (×6): 1000 ug via ORAL
  Filled 2013-03-23 (×6): qty 1

## 2013-03-23 NOTE — ED Notes (Signed)
Failed attempt to insert in & out cath

## 2013-03-23 NOTE — ED Provider Notes (Addendum)
CSN: [id number]     Arrival date & time 03/23/13  1241 History   First MD Initiated Contact with Patient 03/23/13 1249     Chief Complaint  Patient presents with  . Abdominal Pain    HPI Pt complains of generalized abdominal pain.  She often has this problems ongoing for at least the last several years.  She is not sure what the cause is.  She denies any vomiting but does have nausea. She has had trouble with constipation and only one normal BM today.  SHe has had some liquid stools as well.  SHe is not sure what causes her abdominal pain.  She states she went to her doctor today and was sent to the ED. Past Medical History  Diagnosis Date  . Atrial fibrillation   . Hyperlipemia   . GERD (gastroesophageal reflux disease)   . Hypertension   . Breast cancer   . Angina at rest    Past Surgical History  Procedure Laterality Date  . Breast surgery    . Lymphadenectomy    . Hiatal hernia repair    . Cesarean section     No family history on file. History  Substance Use Topics  . Smoking status: Never Smoker   . Smokeless tobacco: Not on file  . Alcohol Use: No   OB History   Grav Para Term Preterm Abortions TAB SAB Ect Mult Living                 Review of Systems  All other systems reviewed and are negative.    Allergies  Review of patient's allergies indicates no known allergies.  Home Medications   Current Outpatient Rx  Name  Route  Sig  Dispense  Refill  . Fe Fum-FePoly-Vit C-Vit B3 (INTEGRA PO)   Oral   Take 1 capsule by mouth daily.         . feeding supplement, RESOURCE BREEZE, (RESOURCE BREEZE) LIQD   Oral   Take 1 Container by mouth 2 (two) times daily between meals.      0   . metoprolol (LOPRESSOR) 50 MG tablet   Oral   Take 1.5 tablets (75 mg total) by mouth 2 (two) times daily.         . mirtazapine (REMERON) 15 MG tablet   Oral   Take 15 mg by mouth at bedtime.         Marland Kitchen oxyCODONE-acetaminophen (PERCOCET) 7.5-325 MG per tablet    Oral   Take 1 tablet by mouth every 8 (eight) hours as needed for pain.         . pantoprazole (PROTONIX) 40 MG tablet   Oral   Take 40 mg by mouth daily.         . polyethylene glycol (MIRALAX / GLYCOLAX) packet   Oral   Take 17 g by mouth 2 (two) times daily as needed (Constipation).   14 each   0   . potassium chloride SA (K-DUR,KLOR-CON) 20 MEQ tablet   Oral   Take 20 mEq by mouth daily.         . silver sulfADIAZINE (SILVADENE) 1 % cream   Topical   Apply topically 2 (two) times daily.   50 g   0   . vitamin B-12 (CYANOCOBALAMIN) 1000 MCG tablet   Oral   Take 1,000 mcg by mouth daily.         Marland Kitchen warfarin (COUMADIN) 5 MG tablet   Oral  Take 0.5-1 tablets (2.5-5 mg total) by mouth daily. Skip today's dose then take 5mg  on Mondays and Wednesdays and takes 2.5mg  on all other days close outpatient INR monitoring at nursing home          BP 121/62  Pulse 71  Temp(Src) 97.4 F (36.3 C) (Oral)  Resp 16  SpO2 92% Physical Exam  Nursing note and vitals reviewed. Constitutional: No distress.  Elderly  HENT:  Head: Normocephalic and atraumatic.  Right Ear: External ear normal.  Left Ear: External ear normal.  Eyes: Conjunctivae are normal. Right eye exhibits no discharge. Left eye exhibits no discharge. No scleral icterus.  Neck: Neck supple. No tracheal deviation present.  Cardiovascular: Normal rate, regular rhythm and intact distal pulses.   Pulmonary/Chest: Effort normal and breath sounds normal. No stridor. No respiratory distress. She has no wheezes. She has no rales.  Abdominal: Soft. She exhibits no distension and no mass. There is tenderness. There is no rebound and no guarding. No hernia.  Genitourinary:  Decreased rectal tone, no mass or impaction, brown stool, small amount of stool in the patient's diaper  Musculoskeletal: She exhibits no edema and no tenderness.  Neurological: She is alert. She has normal strength. No sensory deficit. Cranial  nerve deficit:  no gross defecits noted. She exhibits normal muscle tone. She displays no seizure activity. Coordination normal.  Skin: Skin is warm and dry. No rash noted.  Psychiatric: She has a normal mood and affect.    ED Course  Procedures (including critical care time) Labs Review Labs Reviewed  COMPREHENSIVE METABOLIC PANEL  LIPASE, BLOOD  URINALYSIS, ROUTINE W REFLEX MICROSCOPIC  CBC WITH DIFFERENTIAL   Imaging Review No results found.  EKG Interpretation   None     1500 Difficulty with blood draw and IV access.  [place] IV placed by RN.  1630 Right femoral stick performed by me for blood draw.  US guidance  MDM  Pt has diffuse abdominal pain but history of chronic abdominal pain.  Plan on labs and CT scan to evaluate further.  No sign of fecal impaction on exam    Celene Kras, MD 03/23/13 1537  Celene Kras, MD 03/23/13 [phone]

## 2013-03-23 NOTE — H&P (Signed)
[facility] History and Physical  Lisa Nixon ZOX:[id number] DOB: [birth year] DOA: 03/23/2013  Referring physician: Dr Denton Lank  PCP: Federico Flake Primary cardiologist: Pamella Pert, MD   Chief Complaint:  abdominal pain for several days  HPI:  79 y/o [place] American female with history of A. fib on Coumadin, severe protein malnutrition, hypertension, iron deficiency anemia, GERD, chronic constipation who presented to the ED we'd worsening abdominal pain for several days. Patient was admitted to the hospital one month back with digitalis toxicity and hypotension requiring IV dopamine briefly. She was taken off digoxin and discharged to rehabilitation. Patient returned home from rehabilitation about 2 days back. Patient reports that she has chronic constipation and has a regular bowel movement usually every 3 days. She reports the abdominal pain mainly over the right quadrant which has been ongoing for several weeks but has been worsening for past few days. She denies any fever, chills, headache, blurred vision, dizziness, chest pain, palpitations, shortness of breath, dysuria. She doesn't report poor appetite and off-and-on nausea. She also reports generalized weakness.  Course in the ED Patient's vitals were stable. Blood work done and showed mild leukocytosis, anemia and hypokalemia with potassium of 2.6. A CT scan of the abdomen and pelvis without contrast done  showed fecal impaction along with fluid overload with pleural effusions and small volume ascites. Patient given Dulcolax suppository followed by enema with small amount of bowel movement. On my evaluation patient still had some abdominal discomfort with some improvement. She was found to be in A. fib with RVR with HR in 120s to 130s.   Review of Systems:  Constitutional: Denies fever, chills, diaphoresis, appetite change and fatigue.  HEENT: Poor hearing, Denies photophobia, eye pain, redness,ear pain, congestion,  sore throat, rhinorrhea, sneezing, mouth sores, trouble swallowing, neck pain, neck stiffness and tinnitus.   Respiratory: Denies SOB, DOE, cough, chest tightness,  and wheezing.   Cardiovascular: Denies chest pain, palpitations and leg swelling.  Gastrointestinal: Abdominal pain, nausea, constipation, Denies  vomiting,  diarrhea,  blood in stool and abdominal distention.  Genitourinary: Denies dysuria, urgency, frequency, hematuria, flank pain and difficulty urinating.  Endocrine: Denies polyuria, polydipsia. Musculoskeletal: Denies myalgias, back pain, joint swelling, arthralgias and gait problem.  Skin: Denies pallor, rash and wound.  Neurological: Denies dizziness, seizures, syncope,  light-headedness, numbness and headaches.  Hematological: Denies adenopathy. Easy bruising   Past Medical History  Diagnosis Date  . Atrial fibrillation   . Hyperlipemia   . GERD (gastroesophageal reflux disease)   . Hypertension   . Breast cancer   . Angina at rest    Past Surgical History  Procedure Laterality Date  . Breast surgery    . Lymphadenectomy    . Hiatal hernia repair    . Cesarean section     Social History:  reports that she has never smoked. She does not have any smokeless tobacco history on file. She reports that she does not drink alcohol or use illicit drugs.  No Known Allergies  No family history on file.  Prior to Admission medications   Medication Sig Start Date End Date Taking? Authorizing Provider  Fe Fum-FePoly-Vit C-Vit B3 (INTEGRA PO) Take 1 capsule by mouth daily.   Yes Historical Provider, MD  feeding supplement, RESOURCE BREEZE, (RESOURCE BREEZE) LIQD Take 1 Container by mouth 2 (two) times daily between meals. 02/26/13  Yes Leroy Sea, MD  metoprolol (LOPRESSOR) 50 MG tablet Take 1.5 tablets (75 mg total) by mouth 2 (two) times daily. 02/26/13  Yes Leroy Sea, MD  mirtazapine (REMERON) 15 MG tablet Take 15 mg by mouth at bedtime.   Yes Historical  Provider, MD  oxyCODONE-acetaminophen (PERCOCET) 7.5-325 MG per tablet Take 1 tablet by mouth every 8 (eight) hours as needed for pain.   Yes Historical Provider, MD  pantoprazole (PROTONIX) 40 MG tablet Take 40 mg by mouth daily.   Yes Historical Provider, MD  polyethylene glycol (MIRALAX / GLYCOLAX) packet Take 17 g by mouth 2 (two) times daily as needed (Constipation). 02/26/13  Yes Leroy Sea, MD  potassium chloride SA (K-DUR,KLOR-CON) 20 MEQ tablet Take 20 mEq by mouth daily.   Yes Historical Provider, MD  silver sulfADIAZINE (SILVADENE) 1 % cream Apply topically 2 (two) times daily. 02/26/13  Yes Leroy Sea, MD  vitamin B-12 (CYANOCOBALAMIN) 1000 MCG tablet Take 1,000 mcg by mouth daily.   Yes Historical Provider, MD  warfarin (COUMADIN) 5 MG tablet Take 0.5-1 tablets (2.5-5 mg total) by mouth daily. Skip today's dose then take 5mg  on Mondays and Wednesdays and takes 2.5mg  on all other days close outpatient INR monitoring at nursing home 02/26/13  Yes Leroy Sea, MD    Physical Exam:  Filed Vitals:   03/23/13 1311 03/23/13 1645  BP: 121/62 99/52  Pulse: 71 62  Temp: 97.4 F (36.3 C)   TempSrc: Oral   Resp: 16 16  SpO2: 92% 98%    Constitutional: Vital signs reviewed.  Elderly cachectic female lying in bed in no acute distress. Hard of hearing HEENT: No pallor, no icterus, moist oral mucosa, no cervical lymphadenopathy Cardiovascular: S1 and S2 is irregularly irregular, no murmurs rub or gallop Pulmonary/Chest: CTAB, no wheezes, rales, or rhonchi Abdominal: Soft. , non-distended, tender to palpation over right mid quadrant, bowel sounds are normal, no masses, organomegaly, or guarding present.  GU: no CVA tenderness Musculoskeletal: No joint deformities, erythema, or stiffness, ROM full and no nontender Ext: Warm, no edema Neurological: A&O x3, nonfocal.   Labs on Admission:  Basic Metabolic Panel:  Recent Labs Lab 03/23/13 1610  NA 139  K 2.6*  CL 103   CO2 22  GLUCOSE 122*  BUN 9  CREATININE 1.01  CALCIUM 9.2   Liver Function Tests:  Recent Labs Lab 03/23/13 1610  AST 16  ALT 6  ALKPHOS 58  BILITOT 0.6  PROT 6.3  ALBUMIN 1.8*    Recent Labs Lab 03/23/13 1610  LIPASE 16   No results found for this basename: AMMONIA,  in the last 168 hours CBC:  Recent Labs Lab 03/23/13 1610  WBC 12.3*  NEUTROABS 9.7*  HGB 9.9*  HCT 31.5*  MCV 91.8  PLT 464*   Cardiac Enzymes: No results found for this basename: CKTOTAL, CKMB, CKMBINDEX, TROPONINI,  in the last 168 hours BNP: No components found with this basename: POCBNP,  CBG: No results found for this basename: GLUCAP,  in the last 168 hours  Radiological Exams on Admission: Ct Abdomen Pelvis Wo Contrast  03/23/2013   CLINICAL DATA:  Abdomen pain, no IV access, hypertension, breast cancer, hernia repair  EXAM: CT ABDOMEN AND PELVIS WITHOUT CONTRAST  TECHNIQUE: Multidetector CT imaging of the abdomen and pelvis was performed following the standard protocol without intravenous contrast.  COMPARISON:  02/15/2013  FINDINGS: There is small right and smaller left pleural effusion. These are new when compared to the prior study. Lung bases are otherwise clear except for mild atelectasis. The heart is enlarged.  There is evidence of hiatal hernia repair.  There is a nonobstructive bowel gas pattern. There is diverticulosis of the distal descending and sigmoid colon. No diverticulitis appreciated. There is fecal distention of the rectum to a maximal diameter of 7 cm.  In the non contrasted state, the liver, gallbladder, spleen, pancreas, and adrenal glands are normal. The kidneys are somewhat atrophic but otherwise normal. The aorta is extensively calcified. There is a small volume of free fluid in the pelvis. The appendix appears normal. The bladder is normal. A posterior fundus mass in the uterus measuring about 3 cm is likely a fibroid. The uterus appears somewhat enlarged consistent with  fibroid involvement.  There is a mild T12 compression deformity. This is not significantly different from the prior study.  IMPRESSION: Fluid overload with pleural effusions and small volume ascites.  Fecal impaction in the rectum.  Stable T12 compression deformity.   Electronically Signed   By: Esperanza Heir M.D.   On: 03/23/2013 16:00    EKG: A. fib with RVR at 148, no ST T changes  Assessment/Plan Principal Problem:   Hypokalemia  appears more in the setting of poor po intake. Patient not on lasix. Also was taken off digoxin the last hospitalization. Supplementing with by mouth and IV KCl. Follow magnesium level. Monitor on telemetry  Active Problems:   Constipation, chronic Given suppository and enema in the ED a small bowel movement. Patient is on when necessary MiraLax at home and needs to have aggressive bowel care. I would do so her on scheduled senna and Colace. Also ordered soap suds enema in the morning if she does not have further bowel movement.    Protein-calorie malnutrition, severe Patient appears extremely malnourished and has severe malnutrition. Reports poor appetite and subjective weight loss. Continue with nutritional supplement. Patient seen by nutrition consult during previous hospitalization. Requested further nutritional consult to assess requirement.    Atrial fibrillation with RVR Given one dose IV Cardizem  in the ED. Patient was discharged on Cardizem on recent hospitalization but I do not see in her medication list. Continue home dose of metoprolol. I will resume Cardizem at 60 mg every 6 hours and titrated to once daily given. Continue Coumadin. INR therapeutic. Results and discontinued on a recent hospitalization secondary to toxicity.    Anemia Secondary to iron deficiency. and on supplemental to have continued.     Abdominal  pain, other specified site Appears secondary to constipation. I have stopped her narcotics. We'll followup with a history of bowel  regimen.  Volume overload on CT scan Patient appears clinically euvolemic. Recent 2-D echo was unremarkable. Patient was started on IV fluids in the ED which I have discontinued.  Diet: Cardiac  DVT  prophylaxis : on Coumadin  Code Status: Full code Family Communication: daughter at bedside Disposition Plan: inpatient. Patient lives with her husband 35 yr at home. He is also being admitted this evening for hypoglycemia.   [name] [facility] Pager [phone]  If 7PM-7AM, please contact night-coverage [email or web address] Password [facility] 03/23/2013, 6:47 PM    Total time spent: 70 minutes

## 2013-03-23 NOTE — ED Notes (Signed)
Per EMS, pt sent to ED from her doctor's office by the pt's doctor due to pt having diffuse abdominal pain and reduced abdominal sounds. Pt was being seen at doctor for abdominal pain and CAD.

## 2013-03-23 NOTE — ED Notes (Signed)
Bed: ZO10 Expected date:  Expected time:  Means of arrival:  Comments: Chronic abd pain

## 2013-03-23 NOTE — ED Notes (Signed)
MD informed of K+2.6

## 2013-03-23 NOTE — ED Notes (Signed)
Notified MD of unsuccessful IV attempts by nursing and IV team

## 2013-03-23 NOTE — ED Provider Notes (Signed)
Pt signed out by Dr Lynelle Doctor that labs pending. k v low 2.6. Ecg. Monitor. Mg++ added to labs. kcl iv and po. pcp is Dr [name] - hospitalist service called to admit.   Recheck abd soft nt.  Ct reviewed.   ua still pending.     Suzi Roots, MD 03/23/13 [phone]

## 2013-03-23 NOTE — Progress Notes (Signed)
ANTICOAGULATION CONSULT NOTE - Initial Consult  Pharmacy Consult for Warfarin  Indication: Hx of PE and Afib  No Known Allergies  Patient Measurements: Height: 5\' 4"  (162.6 cm) Weight: 100 lb 14.4 oz (45.768 kg) IBW/kg (Calculated) : 54.7  Vital Signs: Temp: 98.3 F (36.8 C) (11/21 1920) Temp src: Oral (11/21 1920) BP: 83/58 mmHg (11/21 1920) Pulse Rate: 62 (11/21 1645)  Labs:  Recent Labs  03/23/13 1610 03/23/13 1830  HGB 9.9*  --   HCT 31.5*  --   PLT 464*  --   LABPROT  --  22.7*  INR  --  2.08*  CREATININE 1.01  --     Estimated Creatinine Clearance: 27.8 ml/min (by C-G formula based on Cr of 1.01).   Medical History: Past Medical History  Diagnosis Date  . Atrial fibrillation   . Hyperlipemia   . GERD (gastroesophageal reflux disease)   . Hypertension   . Breast cancer   . Angina at rest     Medications:  Scheduled:  . [START ON 03/24/2013] feeding supplement (RESOURCE BREEZE)  1 Container Oral BID BM  . INTEGRA  1 capsule Oral Daily  . metoprolol  75 mg Oral BID  . mirtazapine  15 mg Oral QHS  . pantoprazole  40 mg Oral Daily  . potassium chloride  10 mEq Intravenous Q1 Hr x 3  . potassium chloride SA  40 mEq Oral Q4H  . senna-docusate  2 tablet Oral BID  . sodium chloride  3 mL Intravenous Q12H  . sodium chloride  3 mL Intravenous Q12H  . vitamin B-12  1,000 mcg Oral Daily   Infusions:  . sodium chloride      Assessment: 40 yoF admitted on 11/21 with abdominal pain on chronic warfarin anticoagulation for history of Afib and PE.  Pharmacy consulted to continue warfarin dosing inpatient.  PTA: warfarin 2.5mg  daily except 5 mg MonWed, last dose 11/20 @ 1600  INR 2.08, therapeutic on admission.  CBC:  Hgb 9.9, Plt 464.   No bleeding or complications reported in notes.  Goal of Therapy:  INR 2-3 Monitor platelets by anticoagulation protocol: Yes   Plan:   Warfarin 2.5mg  PO tonight   Daily INR  Pharmacy to f/u daily.  Lynann Beaver PharmD, BCPS Pager [phone] 03/23/2013 7:37 PM

## 2013-03-24 ENCOUNTER — Encounter (HOSPITAL_COMMUNITY): Payer: Self-pay | Admitting: *Deleted

## 2013-03-24 DIAGNOSIS — D649 Anemia, unspecified: Secondary | ICD-10-CM

## 2013-03-24 LAB — BASIC METABOLIC PANEL
BUN: 13 mg/dL (ref 6–23)
CO2: 15 mEq/L — ABNORMAL LOW (ref 19–32)
Chloride: 109 mEq/L (ref 96–112)
Creatinine, Ser: 1.09 mg/dL (ref 0.50–1.10)
GFR calc Af Amer: 51 mL/min — ABNORMAL LOW (ref >90)
Potassium: 4.3 mEq/L (ref 3.5–5.1)
Sodium: 138 mEq/L (ref 135–145)

## 2013-03-24 LAB — PROTIME-INR
INR: 2.71 — ABNORMAL HIGH (ref 0.00–1.49)
Prothrombin Time: 27.8 seconds — ABNORMAL HIGH (ref 11.6–15.2)

## 2013-03-24 MED ORDER — DILTIAZEM HCL 60 MG PO TABS
60.0000 mg | ORAL_TABLET | Freq: Three times a day (TID) | ORAL | Status: DC
  Administered 2013-03-24 – 2013-03-25 (×3): 60 mg via ORAL
  Filled 2013-03-24 (×7): qty 1

## 2013-03-24 MED ORDER — POLYETHYLENE GLYCOL 3350 17 G PO PACK
17.0000 g | PACK | Freq: Two times a day (BID) | ORAL | Status: DC
  Administered 2013-03-24 – 2013-03-26 (×3): 17 g via ORAL
  Filled 2013-03-24 (×6): qty 1

## 2013-03-24 MED ORDER — WARFARIN SODIUM 2.5 MG PO TABS
2.5000 mg | ORAL_TABLET | Freq: Once | ORAL | Status: AC
  Administered 2013-03-24: 19:00:00 2.5 mg via ORAL
  Filled 2013-03-24: qty 1

## 2013-03-24 NOTE — Consult Note (Signed)
Lisa Nixon is an 79 y.o. female.            Ref: Dr. Herby Abraham: Dr. Jeanella Cara Chief Complaint: Atrial fibrillation HPI: 79 y/o female has history of A. fib on Coumadin, severe protein malnutrition, hypertension, iron deficiency anemia, GERD, chronic constipation. She presented to the ED with worsening abdominal pain for several days. Patient was admitted to the hospital one month back with digitalis toxicity and hypotension requiring IV dopamine briefly. She was taken off digoxin and discharged to rehabilitation. Patient returned home from rehabilitation about 2 days prior to admission. Patient reported that she has chronic constipation and has a regular bowel movement usually every 3 days. She has atrial fibrillation with controlled ventricular response. She had good LV systolic function with dilated left atrium 1 month ago.   Past Medical History  Diagnosis Date  . Atrial fibrillation   . Hyperlipemia   . GERD (gastroesophageal reflux disease)   . Hypertension   . Breast cancer   . Angina at rest       Past Surgical History  Procedure Laterality Date  . Breast surgery    . Lymphadenectomy    . Hiatal hernia repair    . Cesarean section      History reviewed. No pertinent family history. Social History:  reports that she has never smoked. She does not have any smokeless tobacco history on file. She reports that she does not drink alcohol or use illicit drugs.  Allergies: No Known Allergies  Medications Prior to Admission  Medication Sig Dispense Refill  . Fe Fum-FePoly-Vit C-Vit B3 (INTEGRA PO) Take 1 capsule by mouth daily.      . feeding supplement, RESOURCE BREEZE, (RESOURCE BREEZE) LIQD Take 1 Container by mouth 2 (two) times daily between meals.    0  . metoprolol (LOPRESSOR) 50 MG tablet Take 1.5 tablets (75 mg total) by mouth 2 (two) times daily.      . mirtazapine (REMERON) 15 MG tablet Take 15 mg by mouth at bedtime.      Marland Kitchen oxyCODONE-acetaminophen (PERCOCET) 7.5-325  MG per tablet Take 1 tablet by mouth every 8 (eight) hours as needed for pain.      . pantoprazole (PROTONIX) 40 MG tablet Take 40 mg by mouth daily.      . polyethylene glycol (MIRALAX / GLYCOLAX) packet Take 17 g by mouth 2 (two) times daily as needed (Constipation).  14 each  0  . potassium chloride SA (K-DUR,KLOR-CON) 20 MEQ tablet Take 20 mEq by mouth daily.      . silver sulfADIAZINE (SILVADENE) 1 % cream Apply topically 2 (two) times daily.  50 g  0  . vitamin B-12 (CYANOCOBALAMIN) 1000 MCG tablet Take 1,000 mcg by mouth daily.      Marland Kitchen warfarin (COUMADIN) 5 MG tablet Take 0.5-1 tablets (2.5-5 mg total) by mouth daily. Skip today's dose then take 5mg  on Mondays and Wednesdays and takes 2.5mg  on all other days close outpatient INR monitoring at nursing home        Results for orders placed during the hospital encounter of 03/23/13 (from the past 48 hour(s))  COMPREHENSIVE METABOLIC PANEL     Status: Abnormal   Collection Time    03/23/13  4:10 PM      Result Value Range   Sodium 139  135 - 145 mEq/L   Potassium 2.6 (*) 3.5 - 5.1 mEq/L   Comment: RESULT REPEATED AND VERIFIED     CRITICAL RESULT CALLED TO, READ  BACK BY AND VERIFIED WITH:     WEST,S. AT 1727 ON 11.21.14 BY LOVE,T.   Chloride 103  96 - 112 mEq/L   CO2 22  19 - 32 mEq/L   Glucose, Bld 122 (*) 70 - 99 mg/dL   BUN 9  6 - 23 mg/dL   Creatinine, Ser 1.61  0.50 - 1.10 mg/dL   Calcium 9.2  8.4 - 09.6 mg/dL   Total Protein 6.3  6.0 - 8.3 g/dL   Albumin 1.8 (*) 3.5 - 5.2 g/dL   AST 16  0 - 37 U/L   ALT 6  0 - 35 U/L   Alkaline Phosphatase 58  39 - 117 U/L   Total Bilirubin 0.6  0.3 - 1.2 mg/dL   GFR calc non Af Amer 48 (*) >90 mL/min   GFR calc Af Amer 56 (*) >90 mL/min   Comment: (NOTE)     The eGFR has been calculated using the CKD EPI equation.     This calculation has not been validated in all clinical situations.     eGFR's persistently <90 mL/min signify possible Chronic Kidney     Disease.  LIPASE, BLOOD      Status: None   Collection Time    03/23/13  4:10 PM      Result Value Range   Lipase 16  11 - 59 U/L  CBC WITH DIFFERENTIAL     Status: Abnormal   Collection Time    03/23/13  4:10 PM      Result Value Range   WBC 12.3 (*) 4.0 - 10.5 K/uL   RBC 3.43 (*) 3.87 - 5.11 MIL/uL   Hemoglobin 9.9 (*) 12.0 - 15.0 g/dL   HCT 04.5 (*) 40.9 - 81.1 %   MCV 91.8  78.0 - 100.0 fL   MCH 28.9  26.0 - 34.0 pg   MCHC 31.4  30.0 - 36.0 g/dL   RDW 91.4 (*) 78.2 - 95.6 %   Platelets 464 (*) 150 - 400 K/uL   Neutrophils Relative % 79 (*) 43 - 77 %   Neutro Abs 9.7 (*) 1.7 - 7.7 K/uL   Lymphocytes Relative 14  12 - 46 %   Lymphs Abs 1.7  0.7 - 4.0 K/uL   Monocytes Relative 7  3 - 12 %   Monocytes Absolute 0.9  0.1 - 1.0 K/uL   Eosinophils Relative 0  0 - 5 %   Eosinophils Absolute 0.0  0.0 - 0.7 K/uL   Basophils Relative 0  0 - 1 %   Basophils Absolute 0.0  0.0 - 0.1 K/uL  MAGNESIUM     Status: None   Collection Time    03/23/13  6:30 PM      Result Value Range   Magnesium 1.7  1.5 - 2.5 mg/dL  PROTIME-INR     Status: Abnormal   Collection Time    03/23/13  6:30 PM      Result Value Range   Prothrombin Time 22.7 (*) 11.6 - 15.2 seconds   INR 2.08 (*) 0.00 - 1.49  URINALYSIS, ROUTINE W REFLEX MICROSCOPIC     Status: Abnormal   Collection Time    03/23/13  7:42 PM      Result Value Range   Color, Urine AMBER (*) YELLOW   Comment: BIOCHEMICALS MAY BE AFFECTED BY COLOR   APPearance CLOUDY (*) CLEAR   Specific Gravity, Urine 1.022  1.005 - 1.030   pH 6.0  5.0 - 8.0  Glucose, UA NEGATIVE  NEGATIVE mg/dL   Hgb urine dipstick NEGATIVE  NEGATIVE   Bilirubin Urine SMALL (*) NEGATIVE   Ketones, ur NEGATIVE  NEGATIVE mg/dL   Protein, ur NEGATIVE  NEGATIVE mg/dL   Urobilinogen, UA 1.0  0.0 - 1.0 mg/dL   Nitrite NEGATIVE  NEGATIVE   Leukocytes, UA SMALL (*) NEGATIVE  URINE MICROSCOPIC-ADD ON     Status: Abnormal   Collection Time    03/23/13  7:42 PM      Result Value Range   Squamous  Epithelial / LPF FEW (*) RARE   WBC, UA 3-6  <3 WBC/hpf   RBC / HPF 0-2  <3 RBC/hpf   Bacteria, UA FEW (*) RARE   Casts HYALINE CASTS (*) NEGATIVE   Crystals CA OXALATE CRYSTALS (*) NEGATIVE   Urine-Other MUCOUS PRESENT     Ct Abdomen Pelvis Wo Contrast  03/23/2013   CLINICAL DATA:  Abdomen pain, no IV access, hypertension, breast cancer, hernia repair  EXAM: CT ABDOMEN AND PELVIS WITHOUT CONTRAST  TECHNIQUE: Multidetector CT imaging of the abdomen and pelvis was performed following the standard protocol without intravenous contrast.  COMPARISON:  02/15/2013  FINDINGS: There is small right and smaller left pleural effusion. These are new when compared to the prior study. Lung bases are otherwise clear except for mild atelectasis. The heart is enlarged.  There is evidence of hiatal hernia repair. There is a nonobstructive bowel gas pattern. There is diverticulosis of the distal descending and sigmoid colon. No diverticulitis appreciated. There is fecal distention of the rectum to a maximal diameter of 7 cm.  In the non contrasted state, the liver, gallbladder, spleen, pancreas, and adrenal glands are normal. The kidneys are somewhat atrophic but otherwise normal. The aorta is extensively calcified. There is a small volume of free fluid in the pelvis. The appendix appears normal. The bladder is normal. A posterior fundus mass in the uterus measuring about 3 cm is likely a fibroid. The uterus appears somewhat enlarged consistent with fibroid involvement.  There is a mild T12 compression deformity. This is not significantly different from the prior study.  IMPRESSION: Fluid overload with pleural effusions and small volume ascites.  Fecal impaction in the rectum.  Stable T12 compression deformity.   Electronically Signed   By: Esperanza Heir M.D.   On: 03/23/2013 16:00    ROS  Constitutional: Negative for fever and chills.  Eyes: Negative for blurred vision and double vision.  Respiratory: Negative for  cough and sputum production.  Cardiovascular: Negative for chest pain and positive for palpitations.  Genitourinary: Positive for frequency and flank pain.  Musculoskeletal: Positive for back pain and joint pain.  Neurological: Negative for headaches.   Blood pressure 95/66, pulse 70, temperature 97.4 F (36.3 C), temperature source Oral, resp. rate 16, height 5\' 4"  (1.626 m), weight 45.768 kg (100 lb 14.4 oz), SpO2 98.00%. Physical Exam: General appearance: alert, cooperative, appears stated age and no distress. Averagely built, poorly nourished.  Head: Normocephalic, without obvious abnormality, atraumatic. Eyes-brown, conj-pink, Sclera-white. Neck-No JVD Resp: Decreased air entry at bases. No crackles.  Cardio: S 1, S 2 irregularly irregular. No rubs. II/VI systolic murmur over precordium.  GI: soft, non-tender; bowel sounds normal; no masses, no organomegaly  Extremities: extremities normal, atraumatic, no cyanosis or edema  Pulses: 2+ and symmetric  Neurologic: No focal deficits. Moves all 4 extremities.  Assessment/Plan Atrial fibrillation-chronic Chronic coumadin use Severe protein-calorie malnutrition Iron deficiency anemia Hypokalemia  Agree with diltiazem use and  holding digoxin. Agree with potassium supplement.  Brandin Stetzer S 03/24/2013, 12:52 PM

## 2013-03-24 NOTE — Progress Notes (Signed)
ANTICOAGULATION CONSULT NOTE - Follow up  Pharmacy Consult for Warfarin  Indication: Hx of PE and Afib  No Known Allergies  Patient Measurements: Height: 5\' 4"  (162.6 cm) Weight: 100 lb 14.4 oz (45.768 kg) IBW/kg (Calculated) : 54.7  Vital Signs: Temp: 97.4 F (36.3 C) (11/22 0603) Temp src: Oral (11/22 0603) BP: 95/66 mmHg (11/22 0603) Pulse Rate: 70 (11/22 0500)  Labs:  Recent Labs  03/23/13 1610 03/23/13 1830  HGB 9.9*  --   HCT 31.5*  --   PLT 464*  --   LABPROT  --  22.7*  INR  --  2.08*  CREATININE 1.01  --    Medications:  Scheduled:  . diltiazem  60 mg Oral Q8H  . feeding supplement (RESOURCE BREEZE)  1 Container Oral BID BM  . iron polysaccharides  150 mg Oral Daily  . metoprolol  75 mg Oral BID  . mirtazapine  15 mg Oral QHS  . pantoprazole  40 mg Oral Daily  . polyethylene glycol  17 g Oral BID  . senna-docusate  2 tablet Oral BID  . sodium chloride  3 mL Intravenous Q12H  . sodium chloride  3 mL Intravenous Q12H  . vitamin B-12  1,000 mcg Oral Daily  . Warfarin - Pharmacist Dosing Inpatient   Does not apply q1800   Assessment: 42 yoF admitted on 11/21 with abdominal pain on chronic warfarin anticoagulation for history of Afib and PE.  Pharmacy consulted to continue warfarin dosing inpatient.  PTA: warfarin 2.5mg  daily except 5 mg MonWed, last dose 11/20 @ 1600  INR 2.08 yesterday, therapeutic on admission.  Unfortunately unable to obtain PT/INR for this am, discussed briefly with MD, will continue home Warfarin dose for tonight (2.5mg ), but will need to obtain lab results tomorrow  No bleeding noted  Goal of Therapy:  INR 2-3   Plan:   Warfarin 2.5mg  PO tonight   Daily INR  Pharmacy to f/u daily.  Otho Bellows PharmD Pager [phone] 03/24/2013, 1:18 PM

## 2013-03-24 NOTE — Progress Notes (Signed)
Patient had a large bowel movement that was soft and watery at 5:30 this morning, soap sud enema was not given per order and patient request.  Patient did not have any complaints of abdominal discomfort through the night and rested well.  Will continue to monitor.  Ernesta Amble, RN

## 2013-03-24 NOTE — Progress Notes (Signed)
[facility] PROGRESS NOTE  Lisa Nixon ZOX:[id number] DOB: 1922/06/26 DOA: 03/23/2013  PCP: Pamella Pert, MD  Brief HPI: 79 y/o African American female with history of A. fib on Coumadin, severe protein malnutrition, hypertension, iron deficiency anemia, GERD, chronic constipation who presented to the ED with worsening abdominal pain for several days. Patient was admitted to the hospital one month back with digitalis toxicity and hypotension requiring IV dopamine briefly. She was taken off digoxin and discharged to rehabilitation. Patient returned home from rehabilitation about 2 days prior to admission. Patient reported that she has chronic constipation and has a regular bowel movement usually every 3 days.  Past medical history:  Past Medical History  Diagnosis Date  . Atrial fibrillation   . Hyperlipemia   . GERD (gastroesophageal reflux disease)   . Hypertension   . Breast cancer   . Angina at rest     Consultants: None  Procedures: None  Antibiotics: None  Subjective: Patient feels better wrt abdominal pain. No nausea or vomiting. Denies any chest pain or shortness of breath.  Objective: Vital Signs  Filed Vitals:   03/24/13 0257 03/24/13 0406 03/24/13 0500 03/24/13 0603  BP: 110/69 96/74 101/70 95/66  Pulse:   70   Temp:   98.7 F (37.1 C) 97.4 F (36.3 C)  TempSrc:   Oral Oral  Resp:      Height:      Weight:      SpO2:   100% 98%    Filed Weights   03/23/13 1920  Weight: 45.768 kg (100 lb 14.4 oz)    Intake/Output from previous day: 11/21 0701 - 11/22 0700 In: 508.3 [IV Piggyback:508.3] Out: 1 [Stool:1]  General appearance: alert, cooperative, appears stated age and no distress Head: Normocephalic, without obvious abnormality, atraumatic Resp: decreased air entry at bases. No crackles. Cardio: S1S2 irregularly irregular. No rubs. Murmur over precorium. GI: soft, non-tender; bowel sounds normal; no masses,  no organomegaly Extremities:  extremities normal, atraumatic, no cyanosis or edema Pulses: 2+ and symmetric Neurologic: No focal deficits  Lab Results:  Basic Metabolic Panel:  Recent Labs Lab 03/23/13 1610 03/23/13 1830  NA 139  --   K 2.6*  --   CL 103  --   CO2 22  --   GLUCOSE 122*  --   BUN 9  --   CREATININE 1.01  --   CALCIUM 9.2  --   MG  --  1.7   Liver Function Tests:  Recent Labs Lab 03/23/13 1610  AST 16  ALT 6  ALKPHOS 58  BILITOT 0.6  PROT 6.3  ALBUMIN 1.8*    Recent Labs Lab 03/23/13 1610  LIPASE 16   CBC:  Recent Labs Lab 03/23/13 1610  WBC 12.3*  NEUTROABS 9.7*  HGB 9.9*  HCT 31.5*  MCV 91.8  PLT 464*   BNP (last 3 results)  Recent Labs  02/18/13 0430  PROBNP 16447.0*    Studies/Results: Ct Abdomen Pelvis Wo Contrast  03/23/2013   CLINICAL DATA:  Abdomen pain, no IV access, hypertension, breast cancer, hernia repair  EXAM: CT ABDOMEN AND PELVIS WITHOUT CONTRAST  TECHNIQUE: Multidetector CT imaging of the abdomen and pelvis was performed following the standard protocol without intravenous contrast.  COMPARISON:  02/15/2013  FINDINGS: There is small right and smaller left pleural effusion. These are new when compared to the prior study. Lung bases are otherwise clear except for mild atelectasis. The heart is enlarged.  There is evidence of hiatal hernia  repair. There is a nonobstructive bowel gas pattern. There is diverticulosis of the distal descending and sigmoid colon. No diverticulitis appreciated. There is fecal distention of the rectum to a maximal diameter of 7 cm.  In the non contrasted state, the liver, gallbladder, spleen, pancreas, and adrenal glands are normal. The kidneys are somewhat atrophic but otherwise normal. The aorta is extensively calcified. There is a small volume of free fluid in the pelvis. The appendix appears normal. The bladder is normal. A posterior fundus mass in the uterus measuring about 3 cm is likely a fibroid. The uterus appears  somewhat enlarged consistent with fibroid involvement.  There is a mild T12 compression deformity. This is not significantly different from the prior study.  IMPRESSION: Fluid overload with pleural effusions and small volume ascites.  Fecal impaction in the rectum.  Stable T12 compression deformity.   Electronically Signed   By: Esperanza Heir M.D.   On: 03/23/2013 16:00    Medications:  Scheduled: . diltiazem  60 mg Oral Q8H  . feeding supplement (RESOURCE BREEZE)  1 Container Oral BID BM  . iron polysaccharides  150 mg Oral Daily  . metoprolol  75 mg Oral BID  . mirtazapine  15 mg Oral QHS  . pantoprazole  40 mg Oral Daily  . polyethylene glycol  17 g Oral BID  . senna-docusate  2 tablet Oral BID  . sodium chloride  3 mL Intravenous Q12H  . sodium chloride  3 mL Intravenous Q12H  . vitamin B-12  1,000 mcg Oral Daily  . Warfarin - Pharmacist Dosing Inpatient   Does not apply q1800   Continuous:  ION:GEXBMW chloride, acetaminophen, acetaminophen, ondansetron (ZOFRAN) IV, ondansetron, sodium chloride  Assessment/Plan:  Principal Problem:   Hypokalemia Active Problems:   Protein-calorie malnutrition, severe   Atrial fibrillation with RVR   Anemia   Diabetes   Abdominal  pain, other specified site   Constipation, chronic    Hypokalemia  Appears more in the setting of poor po intake. Patient was not on lasix. Also was taken off digoxin the last hospitalization. Given oral KCL. Repeat labs pending today.  Mg was checked as well.   Atrial fibrillation with RVR  HR remains high. Patient to receive AM dose of metoprolol. If HR doesn't improve will consult cards. BP is soft. Due to recent Dig toxicity will avoid use. On warfarin per pharmacy. IBR is therapeutic.  Abdominal Pain secondary to Constipation Given suppository and enema in the ED with good response. Needs good bowel regimen. Miralax ATC and Senna. TSH was normal recently. Enemas as needed.  Protein-calorie malnutrition,  severe  Patient appears extremely malnourished and has severe malnutrition. Reports poor appetite and subjective weight loss. Continue with nutritional supplement. Patient seen by nutrition consult during previous hospitalization. Requested further nutritional consult to assess requirement.    Anemia  Secondary to iron deficiency. On Iron supplements.   Volume overload on CT scan  Patient appears clinically euvolemic. Recent 2-D echo was unremarkable. Patient was started on IV fluids in the ED which I have discontinued. Hold off on Lasix for now.  Code Status: Full Code  DVT Prophylaxis: On warfarin    Family Communication: Discussed with patient. Her husband is hospitalized as well.  Disposition Plan: Await improvement in HR. Will need PT/OT. Not ready for discharge.    LOS: 1 day   [facility]  [facility] Pager [phone] 03/24/2013, 10:46 AM  If 8PM-8AM, please contact night-coverage at [email or web address], password [facility]

## 2013-03-25 DIAGNOSIS — E43 Unspecified severe protein-calorie malnutrition: Secondary | ICD-10-CM

## 2013-03-25 LAB — BASIC METABOLIC PANEL
BUN: 12 mg/dL (ref 6–23)
CO2: 17 mEq/L — ABNORMAL LOW (ref 19–32)
Calcium: 8.7 mg/dL (ref 8.4–10.5)
GFR calc non Af Amer: 41 mL/min — ABNORMAL LOW (ref >90)
Glucose, Bld: 128 mg/dL — ABNORMAL HIGH (ref 70–99)
Potassium: 4 mEq/L (ref 3.5–5.1)
Sodium: 136 mEq/L (ref 135–145)

## 2013-03-25 LAB — CBC
HCT: 30.3 % — ABNORMAL LOW (ref 36.0–46.0)
Hemoglobin: 9.4 g/dL — ABNORMAL LOW (ref 12.0–15.0)
MCH: 29 pg (ref 26.0–34.0)
MCHC: 31 g/dL (ref 30.0–36.0)
MCV: 93.5 fL (ref 78.0–100.0)
Platelets: 436 10*3/uL — ABNORMAL HIGH (ref 150–400)
RBC: 3.24 MIL/uL — ABNORMAL LOW (ref 3.87–5.11)
WBC: 11.5 10*3/uL — ABNORMAL HIGH (ref 4.0–10.5)

## 2013-03-25 LAB — URINE CULTURE

## 2013-03-25 LAB — PROTIME-INR
INR: 3.54 — ABNORMAL HIGH (ref 0.00–1.49)
Prothrombin Time: 34.1 seconds — ABNORMAL HIGH (ref 11.6–15.2)

## 2013-03-25 MED ORDER — BOOST / RESOURCE BREEZE PO LIQD
1.0000 | ORAL | Status: DC
  Administered 2013-03-26 – 2013-03-27 (×4): 1 via ORAL

## 2013-03-25 MED ORDER — ENSURE COMPLETE PO LIQD
237.0000 mL | Freq: Two times a day (BID) | ORAL | Status: DC
  Administered 2013-03-25 – 2013-03-27 (×4): 237 mL via ORAL

## 2013-03-25 MED ORDER — DILTIAZEM HCL 60 MG PO TABS
60.0000 mg | ORAL_TABLET | Freq: Four times a day (QID) | ORAL | Status: DC
  Administered 2013-03-25 – 2013-03-26 (×6): 60 mg via ORAL
  Filled 2013-03-25 (×8): qty 1

## 2013-03-25 NOTE — Progress Notes (Addendum)
INITIAL NUTRITION ASSESSMENT  DOCUMENTATION CODES Per approved criteria  -Severe malnutrition in the context of chronic illness -underweight   INTERVENTION:  -Recommend change diet to regular.  Ensure Complete po BID, each supplement provides 350 kcal and 13 grams of protein.  Resource Breeze once daily  Encouraged intake  NUTRITION DIAGNOSIS: Inadequate oral intake related to poor appetite as evidenced by patient report and weight loss..   Goal: Intake of meals and supplements to meet >90% estimated needs and prevent further weight loss.  Monitor:  Intake, labs, weight trend  Reason for Assessment: MST  79 y.o. female  Admitting Dx: Hypokalemia  ASSESSMENT: Patient admitted with hypokalemia. Patient was living at a rehab nursing facility but was recently d/c'ed to home with husband.  A weight loss of 13 lbs noted since last hospital admit 1 month ago.  Patient reports poor intake at the NH due to decreased appetite but improved since admit per patient although documentation reports only 10% of breakfast this am.  Meds noted to include Remeron.    Husband also admitted and seen by this RD today.  They eat breakfast and receive Meals on Wheals for dinner but do not always eat this.  Drinks a supplement or has a snack for her third meal.  Spoke with Daughter who lives in [place].  Son's live here.  Currently patient and husband are having a difficult time caring for each other in the home.  Both meet criteria for malnutrition.  Patient continues to meet criteria for severe malnutrition related to intake <50%, weight loss >12%, severe muscle wasting and fat loss for > 1 month.  Height: Ht Readings from Last 1 Encounters:  03/23/13 5\' 4"  (1.626 m)    Weight: Wt Readings from Last 1 Encounters:  03/23/13 100 lb 14.4 oz (45.768 kg)    Ideal Body Weight: 120 lbs  % Ideal Body Weight: 83  Wt Readings from Last 10 Encounters:  03/23/13 100 lb 14.4 oz (45.768 kg)  02/22/13  113 lb 5.1 oz (51.4 kg)    Usual Body Weight: 113 lbs 1 month ago  % Usual Body Weight: 88  BMI:  Body mass index is 17.31 kg/(m^2).  Estimated Nutritional Needs: Kcal: 1200-1400 Protein: 65-75 gm Fluid: >1.4L daily  Skin: intact  Diet Order: Cardiac  EDUCATION NEEDS: -No education needs identified at this time   Intake/Output Summary (Last 24 hours) at 03/25/13 1108 Last data filed at 03/25/13 0900  Gross per 24 hour  Intake    480 ml  Output      0 ml  Net    480 ml    Last BM: 11/23  Labs:   Recent Labs Lab 03/23/13 1610 03/23/13 1830 03/24/13 1321  NA 139  --  138  K 2.6*  --  4.3  CL 103  --  109  CO2 22  --  15*  BUN 9  --  13  CREATININE 1.01  --  1.09  CALCIUM 9.2  --  8.6  MG  --  1.7  --   GLUCOSE 122*  --  193*    CBG (last 3)  No results found for this basename: GLUCAP,  in the last 72 hours  Scheduled Meds: . diltiazem  60 mg Oral Q6H  . feeding supplement (RESOURCE BREEZE)  1 Container Oral BID BM  . iron polysaccharides  150 mg Oral Daily  . metoprolol  75 mg Oral BID  . mirtazapine  15 mg Oral QHS  .  pantoprazole  40 mg Oral Daily  . polyethylene glycol  17 g Oral BID  . senna-docusate  2 tablet Oral BID  . sodium chloride  3 mL Intravenous Q12H  . sodium chloride  3 mL Intravenous Q12H  . vitamin B-12  1,000 mcg Oral Daily  . Warfarin - Pharmacist Dosing Inpatient   Does not apply q1800    Continuous Infusions:   Past Medical History  Diagnosis Date  . Atrial fibrillation   . Hyperlipemia   . GERD (gastroesophageal reflux disease)   . Hypertension   . Breast cancer   . Angina at rest     Past Surgical History  Procedure Laterality Date  . Breast surgery    . Lymphadenectomy    . Hiatal hernia repair    . Cesarean section      Oran Rein, RD, LDN Clinical Inpatient Dietitian Pager:  [phone] Weekend and after hours pager:  [phone]

## 2013-03-25 NOTE — Progress Notes (Signed)
ANTICOAGULATION CONSULT NOTE - Follow up  Pharmacy Consult for Warfarin  Indication: Hx of PE and Afib  No Known Allergies  Patient Measurements: Height: 5\' 4"  (162.6 cm) Weight: 100 lb 14.4 oz (45.768 kg) IBW/kg (Calculated) : 54.7  Vital Signs: Temp: 97.6 F (36.4 C) (11/23 0500) Temp src: Oral (11/23 0500) BP: 105/71 mmHg (11/23 0500)  Labs:  Recent Labs  03/23/13 1610 03/23/13 1830 03/24/13 1321 03/25/13 1146  HGB 9.9*  --   --  9.4*  HCT 31.5*  --   --  30.3*  PLT 464*  --   --  436*  LABPROT  --  22.7* 27.8* 34.1*  INR  --  2.08* 2.71* 3.54*  CREATININE 1.01  --  1.09 1.16*   Medications:  Scheduled:  . diltiazem  60 mg Oral Q6H  . feeding supplement (ENSURE COMPLETE)  237 mL Oral BID BM  . [START ON 03/26/2013] feeding supplement (RESOURCE BREEZE)  1 Container Oral Q24H  . iron polysaccharides  150 mg Oral Daily  . metoprolol  75 mg Oral BID  . mirtazapine  15 mg Oral QHS  . pantoprazole  40 mg Oral Daily  . polyethylene glycol  17 g Oral BID  . senna-docusate  2 tablet Oral BID  . sodium chloride  3 mL Intravenous Q12H  . sodium chloride  3 mL Intravenous Q12H  . vitamin B-12  1,000 mcg Oral Daily  . Warfarin - Pharmacist Dosing Inpatient   Does not apply q1800   Assessment: 72 yoF admitted on 11/21 with abdominal pain on chronic warfarin anticoagulation for history of Afib and PE.  Pharmacy consulted to continue warfarin dosing inpatient.  PTA: warfarin 2.5mg  daily except 5 mg MonWed, last dose 11/20 @ 1600  Unfortunately unable to obtain PT/INR for this am 11/22, discussed briefly with MD, will continue home Warfarin dose for tonight (2.5mg ), but will need to obtain lab results tomorrow  INR obtained yesterday afternoon was 2.71, this am INR = 3.54, will hold Warfarin  No bleeding noted  Goal of Therapy:  INR 2-3   Plan:   Hold Warfarin today  Daily INR  Otho Bellows PharmD Pager [phone] 03/25/2013, 12:56 PM

## 2013-03-25 NOTE — Progress Notes (Signed)
[facility] PROGRESS NOTE  Lisa Nixon RUE:[id number] DOB: April 28, 1923 DOA: 03/23/2013  PCP: Pamella Pert, MD  Brief HPI: 79 y/o African American female with history of A. fib on Coumadin, severe protein malnutrition, hypertension, iron deficiency anemia, GERD, chronic constipation who presented to the ED with worsening abdominal pain for several days. Patient was admitted to the hospital one month back with digitalis toxicity and hypotension requiring IV dopamine briefly. She was taken off digoxin and discharged to rehabilitation. Patient returned home from rehabilitation about 2 days prior to admission. Patient reported that she has chronic constipation and has a regular bowel movement usually every 3 days.  Past medical history:  Past Medical History  Diagnosis Date  . Atrial fibrillation   . Hyperlipemia   . GERD (gastroesophageal reflux disease)   . Hypertension   . Breast cancer   . Angina at rest     Consultants: Dr. Doree Albee  Procedures: None  Antibiotics: None  Subjective: Patient feels same. No new complaints. No nausea or vomiting. Denies any chest pain or shortness of breath.  Objective: Vital Signs  Filed Vitals:   03/24/13 2144 03/24/13 2156 03/25/13 0213 03/25/13 0500  BP:  109/80 108/70 105/71  Pulse: 80 98    Temp:  97.6 F (36.4 C)  97.6 F (36.4 C)  TempSrc:  Oral  Oral  Resp:  18  22  Height:      Weight:      SpO2:  93%  98%    Filed Weights   03/23/13 1920  Weight: 45.768 kg (100 lb 14.4 oz)    Intake/Output from previous day: 11/22 0701 - 11/23 0700 In: 360 [P.O.:360] Out: -   General appearance: alert, cooperative, appears stated age and no distress Head: Normocephalic, without obvious abnormality, atraumatic Resp: decreased air entry at bases. No crackles. Cardio: S1S2 irregularly irregular, tachy. No rubs. Murmur over precorium. GI: soft, non-tender; bowel sounds normal; no masses,  no organomegaly Extremities:  extremities normal, atraumatic, no cyanosis or edema Pulses: 2+ and symmetric Neurologic: No focal deficits  Lab Results:  Basic Metabolic Panel:  Recent Labs Lab 03/23/13 1610 03/23/13 1830 03/24/13 1321  NA 139  --  138  K 2.6*  --  4.3  CL 103  --  109  CO2 22  --  15*  GLUCOSE 122*  --  193*  BUN 9  --  13  CREATININE 1.01  --  1.09  CALCIUM 9.2  --  8.6  MG  --  1.7  --    Liver Function Tests:  Recent Labs Lab 03/23/13 1610  AST 16  ALT 6  ALKPHOS 58  BILITOT 0.6  PROT 6.3  ALBUMIN 1.8*    Recent Labs Lab 03/23/13 1610  LIPASE 16   CBC:  Recent Labs Lab 03/23/13 1610  WBC 12.3*  NEUTROABS 9.7*  HGB 9.9*  HCT 31.5*  MCV 91.8  PLT 464*   BNP (last 3 results)  Recent Labs  02/18/13 0430  PROBNP 16447.0*    Studies/Results: Ct Abdomen Pelvis Wo Contrast  03/23/2013   CLINICAL DATA:  Abdomen pain, no IV access, hypertension, breast cancer, hernia repair  EXAM: CT ABDOMEN AND PELVIS WITHOUT CONTRAST  TECHNIQUE: Multidetector CT imaging of the abdomen and pelvis was performed following the standard protocol without intravenous contrast.  COMPARISON:  02/15/2013  FINDINGS: There is small right and smaller left pleural effusion. These are new when compared to the prior study. Lung bases are otherwise clear except  for mild atelectasis. The heart is enlarged.  There is evidence of hiatal hernia repair. There is a nonobstructive bowel gas pattern. There is diverticulosis of the distal descending and sigmoid colon. No diverticulitis appreciated. There is fecal distention of the rectum to a maximal diameter of 7 cm.  In the non contrasted state, the liver, gallbladder, spleen, pancreas, and adrenal glands are normal. The kidneys are somewhat atrophic but otherwise normal. The aorta is extensively calcified. There is a small volume of free fluid in the pelvis. The appendix appears normal. The bladder is normal. A posterior fundus mass in the uterus measuring  about 3 cm is likely a fibroid. The uterus appears somewhat enlarged consistent with fibroid involvement.  There is a mild T12 compression deformity. This is not significantly different from the prior study.  IMPRESSION: Fluid overload with pleural effusions and small volume ascites.  Fecal impaction in the rectum.  Stable T12 compression deformity.   Electronically Signed   By: Esperanza Heir M.D.   On: 03/23/2013 16:00    Medications:  Scheduled: . diltiazem  60 mg Oral Q6H  . feeding supplement (RESOURCE BREEZE)  1 Container Oral BID BM  . iron polysaccharides  150 mg Oral Daily  . metoprolol  75 mg Oral BID  . mirtazapine  15 mg Oral QHS  . pantoprazole  40 mg Oral Daily  . polyethylene glycol  17 g Oral BID  . senna-docusate  2 tablet Oral BID  . sodium chloride  3 mL Intravenous Q12H  . sodium chloride  3 mL Intravenous Q12H  . vitamin B-12  1,000 mcg Oral Daily  . Warfarin - Pharmacist Dosing Inpatient   Does not apply q1800   Continuous:  WUJ:WJXBJY chloride, acetaminophen, acetaminophen, ondansetron (ZOFRAN) IV, ondansetron, sodium chloride  Assessment/Plan:  Principal Problem:   Hypokalemia Active Problems:   Protein-calorie malnutrition, severe   Atrial fibrillation with RVR   Anemia   Diabetes   Abdominal  pain, other specified site   Constipation, chronic    Hypokalemia  Resolved as per labs from 11/22. Etiology was likely due to poor po intake. Patient was not on lasix. Also was taken off digoxin the last hospitalization.   Atrial fibrillation with RVR  HR remains high. Will increase cardizem to q6h. Continue BB. Cardiology is following. Due to recent Dig toxicity will avoid use. On warfarin per pharmacy. INR is therapeutic.  Abdominal Pain secondary to Constipation Given suppository and enema in the ED with good response. Needs good bowel regimen. Miralax ATC and Senna. TSH was normal recently. Enemas as needed.  Protein-calorie malnutrition, severe   Patient appears extremely malnourished and has severe malnutrition. Reports poor appetite and subjective weight loss. Continue with nutritional supplement. Patient seen by nutrition consult during previous hospitalization. Requested further nutritional consult to assess requirement.    Anemia  Secondary to iron deficiency. On Iron supplements.   Volume overload on CT scan  Patient appears clinically euvolemic. Recent 2-D echo was unremarkable. Patient was started on IV fluids in the ED which has been discontinued. Hold off on Lasix for now.  Code Status: Full Code  DVT Prophylaxis: On warfarin    Family Communication: Discussed with patient. Her husband is hospitalized as well.  Disposition Plan: Await improvement in HR. Will need PT/OT. Not ready for discharge.    LOS: 2 days   [facility]  [facility] Pager [phone] 03/25/2013, 8:36 AM  If 8PM-8AM, please contact night-coverage at [email or web address], password [facility]

## 2013-03-25 NOTE — Consult Note (Signed)
Subjective:  Feeling better. Afebrile.  Objective:  Vital Signs in the last 24 hours: Temp:  [97.4 F (36.3 C)-97.6 F (36.4 C)] 97.6 F (36.4 C) (11/23 0500) Pulse Rate:  [80-98] 98 (11/22 2156) Cardiac Rhythm:  [-] Atrial fibrillation (11/22 2030) Resp:  [18-22] 22 (11/23 0500) BP: (92-116)/(56-80) 105/71 mmHg (11/23 0500) SpO2:  [93 %-100 %] 98 % (11/23 0500)  Physical Exam: BP Readings from Last 1 Encounters:  03/25/13 105/71     Wt Readings from Last 1 Encounters:  03/23/13 45.768 kg (100 lb 14.4 oz)    Weight change:   HEENT: [place]/AT, Eyes-Brown, PERL, EOMI, Conjunctiva-Pink, Sclera-Non-icteric Neck: No JVD, No bruit, Trachea midline. Lungs:  Clear, Bilateral. Cardiac:  Regular rhythm, normal S1 and S2, no S3.  Abdomen:  Soft, non-tender. Extremities:  No edema present. No cyanosis. No clubbing. CNS: AxOx3, Cranial nerves grossly intact, moves all 4 extremities. Right handed. Skin: Warm and dry.   Intake/Output from previous day: 11/22 0701 - 11/23 0700 In: 360 [P.O.:360] Out: -     Lab Results: BMET    Component Value Date/Time   NA 136 03/25/2013 1146   K 4.0 03/25/2013 1146   CL 109 03/25/2013 1146   CO2 17* 03/25/2013 1146   GLUCOSE 128* 03/25/2013 1146   BUN 12 03/25/2013 1146   CREATININE 1.16* 03/25/2013 1146   CALCIUM 8.7 03/25/2013 1146   GFRNONAA 41* 03/25/2013 1146   GFRAA 47* 03/25/2013 1146   CBC    Component Value Date/Time   WBC 11.5* 03/25/2013 1146   WBC 8.1 05/30/2006 1519   RBC 3.24* 03/25/2013 1146   RBC 3.79 05/30/2006 1519   HGB 9.4* 03/25/2013 1146   HGB 12.1 05/30/2006 1519   HCT 30.3* 03/25/2013 1146   HCT 35.2 05/30/2006 1519   PLT 436* 03/25/2013 1146   PLT 384 05/30/2006 1519   MCV 93.5 03/25/2013 1146   MCV 93.0 05/30/2006 1519   MCH 29.0 03/25/2013 1146   MCH 32.0 05/30/2006 1519   MCHC 31.0 03/25/2013 1146   MCHC 34.4 05/30/2006 1519   RDW 19.3* 03/25/2013 1146   RDW 13.8 05/30/2006 1519   LYMPHSABS 1.7 03/23/2013  1610   LYMPHSABS 2.1 05/30/2006 1519   MONOABS 0.9 03/23/2013 1610   MONOABS 1.4* 05/30/2006 1519   EOSABS 0.0 03/23/2013 1610   EOSABS 0.1 05/30/2006 1519   BASOSABS 0.0 03/23/2013 1610   BASOSABS 0.0 05/30/2006 1519   CARDIAC ENZYMES Lab Results  Component Value Date   CKTOTAL 49 02/19/2013   TROPONINI <0.30 02/19/2013    Scheduled Meds: . diltiazem  60 mg Oral Q6H  . feeding supplement (ENSURE COMPLETE)  237 mL Oral BID BM  . [START ON 03/26/2013] feeding supplement (RESOURCE BREEZE)  1 Container Oral Q24H  . iron polysaccharides  150 mg Oral Daily  . metoprolol  75 mg Oral BID  . mirtazapine  15 mg Oral QHS  . pantoprazole  40 mg Oral Daily  . polyethylene glycol  17 g Oral BID  . senna-docusate  2 tablet Oral BID  . sodium chloride  3 mL Intravenous Q12H  . sodium chloride  3 mL Intravenous Q12H  . vitamin B-12  1,000 mcg Oral Daily  . Warfarin - Pharmacist Dosing Inpatient   Does not apply q1800   Continuous Infusions:  PRN Meds:.sodium chloride, acetaminophen, acetaminophen, ondansetron (ZOFRAN) IV, ondansetron, sodium chloride  Assessment/Plan: Atrial fibrillation-chronic  Chronic coumadin use  Severe protein-calorie malnutrition  Iron deficiency anemia  Hypokalemia-resolved  Continue Diltiazem and B-blocker. INR goal-2.5-3.5 Dr. Jeanella Cara will be back in AM if needed.    LOS: 2 days    Orpah Cobb  MD  03/25/2013, 12:53 PM

## 2013-03-26 LAB — BASIC METABOLIC PANEL
Calcium: 9 mg/dL (ref 8.4–10.5)
Chloride: 108 mEq/L (ref 96–112)
GFR calc non Af Amer: 44 mL/min — ABNORMAL LOW (ref >90)
Glucose, Bld: 137 mg/dL — ABNORMAL HIGH (ref 70–99)
Potassium: 3.9 mEq/L (ref 3.5–5.1)
Sodium: 139 mEq/L (ref 135–145)

## 2013-03-26 LAB — PROTIME-INR
INR: 3.03 — ABNORMAL HIGH (ref 0.00–1.49)
Prothrombin Time: 30.3 seconds — ABNORMAL HIGH (ref 11.6–15.2)

## 2013-03-26 LAB — CBC
Hemoglobin: 10.6 g/dL — ABNORMAL LOW (ref 12.0–15.0)
MCHC: 31.5 g/dL (ref 30.0–36.0)
RDW: 19.4 % — ABNORMAL HIGH (ref 11.5–15.5)
WBC: 10.6 10*3/uL — ABNORMAL HIGH (ref 4.0–10.5)

## 2013-03-26 MED ORDER — WARFARIN SODIUM 2 MG PO TABS
2.0000 mg | ORAL_TABLET | Freq: Once | ORAL | Status: AC
  Administered 2013-03-26: 17:00:00 2 mg via ORAL
  Filled 2013-03-26: qty 1

## 2013-03-26 MED ORDER — DILTIAZEM HCL 60 MG PO TABS
60.0000 mg | ORAL_TABLET | Freq: Four times a day (QID) | ORAL | Status: DC
  Administered 2013-03-26 – 2013-03-27 (×2): 60 mg via ORAL
  Filled 2013-03-26 (×6): qty 1

## 2013-03-26 MED ORDER — POLYETHYLENE GLYCOL 3350 17 G PO PACK
17.0000 g | PACK | Freq: Every day | ORAL | Status: DC
  Filled 2013-03-26 (×2): qty 1

## 2013-03-26 MED ORDER — DILTIAZEM HCL 60 MG PO TABS
60.0000 mg | ORAL_TABLET | Freq: Four times a day (QID) | ORAL | Status: DC
  Filled 2013-03-26: qty 1

## 2013-03-26 MED ORDER — METOPROLOL TARTRATE 100 MG PO TABS
100.0000 mg | ORAL_TABLET | Freq: Two times a day (BID) | ORAL | Status: DC
  Administered 2013-03-26 – 2013-03-28 (×4): 100 mg via ORAL
  Filled 2013-03-26 (×5): qty 1

## 2013-03-26 NOTE — Evaluation (Signed)
Physical Therapy Evaluation Patient Details Name: Lisa Nixon MRN: [id number] DOB: 11-10-1922 Today's Date: 03/26/2013 Time: 1200-1223 PT Time Calculation (min): 23 min  PT Assessment / Plan / Recommendation History of Present Illness  Pt admit with malnutrition, AMS, afib as well. DC'd from SNF 2 days PTA  Clinical Impression  Pt is very weak. Pt will benefit from PT to address problems. Pt had recently been in rehab and DC's recently. No family present for discussing DC plan.Pt may require more assistance at DC. Pt reports that spouse was admitted to hospital.    PT Assessment  Patient needs continued PT services    Follow Up Recommendations  SNF (no family present to discuss DC plan.)    Does the patient have the potential to tolerate intense rehabilitation      Barriers to Discharge        Equipment Recommendations  None recommended by PT    Recommendations for Other Services     Frequency Min 3X/week    Precautions / Restrictions Precautions Precautions: Fall Precaution Comments: incr. HR Restrictions Weight Bearing Restrictions: No   Pertinent Vitals/Pain BP 97/59 after pivit to [facility]  99/75 after return to bed.  HR 138 after mobilizing. RN aware.      Mobility  Bed Mobility Bed Mobility: Supine to Sit;Sit to Supine;Sitting - Scoot to Edge of Bed Supine to Sit: 3: Mod assist Sitting - Scoot to Edge of Bed: 3: Mod assist Sit to Supine: 3: Mod assist Details for Bed Mobility Assistance: support to right trunk, weak effort to push up. Transfers Transfers: Sit to Stand;Stand to Sit;Stand Pivot Transfers Sit to Stand: 3: Mod assist;From bed;From chair/3-in-1;With upper extremity assist Stand to Sit: 3: Mod assist;To bed;To chair/3-in-1;With upper extremity assist Details for Transfer Assistance: extra time for rising, cues to reach for armrest. Ambulation/Gait Ambulation/Gait Assistance: Not tested (comment)    Exercises     PT Diagnosis: Difficulty  walking;Generalized weakness  PT Problem List: Decreased strength;Decreased activity tolerance;Decreased balance;Decreased mobility;Cardiopulmonary status limiting activity;Decreased knowledge of precautions;Decreased safety awareness PT Treatment Interventions: DME instruction;Gait training;Functional mobility training;Therapeutic activities;Therapeutic exercise;Patient/family education     PT Goals(Current goals can be found in the care plan section) Acute Rehab PT Goals Patient Stated Goal: pt agreed to get to [facility] and try to walk. PT Goal Formulation: With patient Time For Goal Achievement: 04/09/13 Potential to Achieve Goals: Good  Visit Information  Last PT Received On: 03/26/13 Assistance Needed: +2 (safety) History of Present Illness: Pt admit with malnutrition, AMS, afib as well. DC'd from SNF 2 days PTA       Prior Functioning  Home Living Family/patient expects to be discharged to:: Private residence Available Help at Discharge: Family;Available 24 hours/day (unsure if has @$/7, spouse sickly.) Type of Home: House Home Access: Level entry Home Equipment: Cane - single point;Bedside commode;Shower seat;Walker - 2 wheels Additional Comments: Pt reports that her husband uses a  walker and she uses a cane.  Pt states that her husbaand was in hospital for hypoglycemia. Prior Function Level of Independence: Independent with assistive device(s) (was recently at [facility] Communication Communication: HOH    Cognition  Cognition Arousal/Alertness: Awake/alert Behavior During Therapy: WFL for tasks assessed/performed Overall Cognitive Status: No family/caregiver present to determine baseline cognitive functioning Area of Impairment: Safety/judgement;Orientation Orientation Level:  ([facility]new [facility])    Extremity/Trunk Assessment Upper Extremity Assessment Upper Extremity Assessment: Defer to OT evaluation Lower Extremity Assessment Lower Extremity Assessment:  Generalized weakness   Balance  End of Session PT - End of Session Activity Tolerance: Patient limited by fatigue Patient left: in bed;with call bell/phone within reach;with bed alarm set Nurse Communication: Mobility status  GP     Lisa Nixon 03/26/2013, 1:28 PM  Blanchard Kelch PT [phone]

## 2013-03-26 NOTE — Progress Notes (Signed)
ANTICOAGULATION CONSULT NOTE - Follow up  Pharmacy Consult for Warfarin  Indication: Hx of PE and Afib  No Known Allergies  Patient Measurements: Height: 5\' 4"  (162.6 cm) Weight: 100 lb 14.4 oz (45.768 kg) IBW/kg (Calculated) : 54.7  Vital Signs: Temp: 97.5 F (36.4 C) (11/24 0515) Temp src: Oral (11/24 0515) BP: 112/82 mmHg (11/24 0515) Pulse Rate: 114 (11/24 0515)  Labs:  Recent Labs  03/23/13 1610  03/24/13 1321 03/25/13 1146 03/26/13 0649  HGB 9.9*  --   --  9.4* 10.6*  HCT 31.5*  --   --  30.3* 33.7*  PLT 464*  --   --  436* 389  LABPROT  --   < > 27.8* 34.1* 30.3*  INR  --   < > 2.71* 3.54* 3.03*  CREATININE 1.01  --  1.09 1.16* 1.10  < > = values in this interval not displayed.  Medications:  Scheduled:  . diltiazem  60 mg Oral Q6H  . feeding supplement (ENSURE COMPLETE)  237 mL Oral BID BM  . feeding supplement (RESOURCE BREEZE)  1 Container Oral Q24H  . iron polysaccharides  150 mg Oral Daily  . metoprolol  100 mg Oral BID  . mirtazapine  15 mg Oral QHS  . pantoprazole  40 mg Oral Daily  . [START ON 03/27/2013] polyethylene glycol  17 g Oral Daily  . senna-docusate  2 tablet Oral BID  . sodium chloride  3 mL Intravenous Q12H  . vitamin B-12  1,000 mcg Oral Daily  . Warfarin - Pharmacist Dosing Inpatient   Does not apply q1800   Assessment: 54 yoF admitted on 11/21 with abdominal pain on chronic warfarin anticoagulation for history of Afib and PE.  Pharmacy consulted to continue warfarin dosing inpatient.  PTA: warfarin 2.5mg  daily except 5 mg MonWed, last dose 11/20 @ 1600  INR 3.03 (returning to goal range after holding x1 on 11/23)  CBC: Hgb 10.6 is improved, Plt remain wnl  No bleeding or complications noted  Diet:  Heart healthy.  Only 10-20% of meals documented as eaten yesterday.  Goal of Therapy:  INR 2-3   Plan:   Warfarin 2mg  today  Daily INR   Lynann Beaver PharmD, BCPS Pager [phone] 03/26/2013 11:25 AM

## 2013-03-26 NOTE — Evaluation (Signed)
Occupational Therapy Evaluation Patient Details Name: Lisa Nixon MRN: [id number] DOB: Sep 08, 1922 Today's Date: 03/26/2013 Time: 6213-0865 OT Time Calculation (min): 13 min  OT Assessment / Plan / Recommendation History of present illness Pt admit with malnutrition, AMS, afib as well. DC'd from SNF 2 days PTA   Clinical Impression   Pt presents to OT with overall decreased I with ADL activity due to problems listed below and will benefit from skilled OT to increase I with ADL activity. Pt will likely need ST SNF    OT Assessment  Patient needs continued OT Services    Follow Up Recommendations  SNF    Barriers to Discharge Decreased caregiver support    Equipment Recommendations  None recommended by OT       Frequency  Min 2X/week    Precautions / Restrictions Precautions Precautions: Fall Precaution Comments: incr. HR       ADL  Grooming: Set up Where Assessed - Grooming: Unsupported sitting Lower Body Dressing: Moderate assistance Where Assessed - Lower Body Dressing: Supported sit to Pharmacist, hospital: Moderate assistance Toilet Transfer Method: Stand pivot;Sit to Barista: Materials engineer and Hygiene: Moderate assistance Where Assessed - Toileting Clothing Manipulation and Hygiene: Sit to stand from 3-in-1 or toilet    OT Diagnosis: Generalized weakness  OT Problem List: Decreased strength;Decreased activity tolerance OT Treatment Interventions: Self-care/ADL training;DME and/or AE instruction;Patient/family education   OT Goals(Current goals can be found in the care plan section) Acute Rehab OT Goals Patient Stated Goal: to get stronger again OT Goal Formulation: With patient Time For Goal Achievement: 04/07/13 Potential to Achieve Goals: Good  Visit Information  Assistance Needed: +2 History of Present Illness: Pt admit with malnutrition, AMS, afib as well. DC'd from SNF 2 days PTA        Prior Functioning     Home Living Family/patient expects to be discharged to:: Private residence Available Help at Discharge: Family;Available 24 hours/day (unsure if has @$/7, spouse sickly.) Type of Home: House Home Access: Level entry Home Equipment: Cane - single point;Bedside commode;Shower seat;Walker - 2 wheels Additional Comments: Pt reports that her husband uses a  walker and she uses a cane.  Pt states that her husbaand was in hospital for hypoglycemia. Prior Function Level of Independence: Independent with assistive device(s) (was recently at [facility].) Communication Communication: HOH         Vision/Perception Vision - History Patient Visual Report: No change from baseline   Cognition  Cognition Arousal/Alertness: Awake/alert Behavior During Therapy: WFL for tasks assessed/performed Overall Cognitive Status: No family/caregiver present to determine baseline cognitive functioning Area of Impairment: Safety/judgement;Orientation Orientation Level:  (knew at [facility]not [facility])    Extremity/Trunk Assessment Upper Extremity Assessment Upper Extremity Assessment: Generalized weakness Lower Extremity Assessment Lower Extremity Assessment: Generalized weakness     Mobility Bed Mobility Bed Mobility: Supine to Sit;Sit to Supine;Sitting - Scoot to Edge of Bed Supine to Sit: 3: Mod assist Sitting - Scoot to Edge of Bed: 3: Mod assist Sit to Supine: 3: Mod assist Details for Bed Mobility Assistance: support to right trunk, weak effort to push up. Transfers Sit to Stand: 3: Mod assist;From bed;From chair/3-in-1;With upper extremity assist Stand to Sit: 3: Mod assist;To bed;To chair/3-in-1;With upper extremity assist Details for Transfer Assistance: extra time for rising, cues to reach for armrest.           End of Session OT - End of Session Activity Tolerance: Patient limited by fatigue  Patient left: in bed;with nursing/sitter in room;with call bell/phone  within reach;with bed alarm set Nurse Communication: Mobility status  GO     Alba Cory 03/26/2013, 2:27 PM

## 2013-03-26 NOTE — Progress Notes (Signed)
[facility] PROGRESS NOTE  Lisa Nixon WUJ:[id number] DOB: 1922/06/07 DOA: 03/23/2013  PCP: Pamella Pert, MD  Brief HPI: 79 y/o African American female with history of A. fib on Coumadin, severe protein malnutrition, hypertension, iron deficiency anemia, GERD, chronic constipation who presented to the ED with worsening abdominal pain for several days. Patient was admitted to the hospital one month back with digitalis toxicity and hypotension requiring IV dopamine briefly. She was taken off digoxin and discharged to rehabilitation. Patient returned home from rehabilitation about 2 days prior to admission. Patient reported that she has chronic constipation and has a regular bowel movement usually every 3 days.  Past medical history:  Past Medical History  Diagnosis Date  . Atrial fibrillation   . Hyperlipemia   . GERD (gastroesophageal reflux disease)   . Hypertension   . Breast cancer   . Angina at rest     Consultants: Dr. Doree Albee  Procedures: None  Antibiotics: None  Subjective: Patient feels same. Slightly confused this morning but then it improved. Wants to go home. No nausea or vomiting. Denies any chest pain or shortness of breath. Has been having a lot of BM's.  Objective: Vital Signs  Filed Vitals:   03/25/13 0500 03/25/13 1450 03/25/13 2219 03/26/13 0515  BP: 105/71 102/78 104/67 112/82  Pulse:  98 90 114  Temp: 97.6 F (36.4 C) 97.7 F (36.5 C) 98.5 F (36.9 C) 97.5 F (36.4 C)  TempSrc: Oral Tympanic Oral Oral  Resp: 22 18 20 16   Height:      Weight:      SpO2: 98% 98% 100% 100%    Filed Weights   03/23/13 1920  Weight: 45.768 kg (100 lb 14.4 oz)    Intake/Output from previous day: 11/23 0701 - 11/24 0700 In: 480 [P.O.:480] Out: -   General appearance: alert, cooperative, appears stated age and no distress Resp: decreased air entry at bases. No crackles. Cardio: S1S2 irregularly irregular, tachy. No rubs. Murmur over  precordium. GI: soft, non-tender; bowel sounds normal; no masses,  no organomegaly Extremities: extremities normal, atraumatic, no cyanosis or edema Pulses: 2+ and symmetric Neurologic: No focal deficits  Lab Results:  Basic Metabolic Panel:  Recent Labs Lab 03/23/13 1610 03/23/13 1830 03/24/13 1321 03/25/13 1146 03/26/13 0649  NA 139  --  138 136 139  K 2.6*  --  4.3 4.0 3.9  CL 103  --  109 109 108  CO2 22  --  15* 17* 19  GLUCOSE 122*  --  193* 128* 137*  BUN 9  --  13 12 10   CREATININE 1.01  --  1.09 1.16* 1.10  CALCIUM 9.2  --  8.6 8.7 9.0  MG  --  1.7  --   --   --    Liver Function Tests:  Recent Labs Lab 03/23/13 1610  AST 16  ALT 6  ALKPHOS 58  BILITOT 0.6  PROT 6.3  ALBUMIN 1.8*    Recent Labs Lab 03/23/13 1610  LIPASE 16   CBC:  Recent Labs Lab 03/23/13 1610 03/25/13 1146 03/26/13 0649  WBC 12.3* 11.5* 10.6*  NEUTROABS 9.7*  --   --   HGB 9.9* 9.4* 10.6*  HCT 31.5* 30.3* 33.7*  MCV 91.8 93.5 92.8  PLT 464* 436* 389   BNP (last 3 results)  Recent Labs  02/18/13 0430  PROBNP 16447.0*    Studies/Results: No results found.  Medications:  Scheduled: . diltiazem  60 mg Oral Q6H  . feeding  supplement (ENSURE COMPLETE)  237 mL Oral BID BM  . feeding supplement (RESOURCE BREEZE)  1 Container Oral Q24H  . iron polysaccharides  150 mg Oral Daily  . metoprolol  75 mg Oral BID  . mirtazapine  15 mg Oral QHS  . pantoprazole  40 mg Oral Daily  . polyethylene glycol  17 g Oral BID  . senna-docusate  2 tablet Oral BID  . sodium chloride  3 mL Intravenous Q12H  . vitamin B-12  1,000 mcg Oral Daily  . Warfarin - Pharmacist Dosing Inpatient   Does not apply q1800   Continuous:  ZOX:[id number], acetaminophen, ondansetron (ZOFRAN) IV, ondansetron  Assessment/Plan:  Principal Problem:   Hypokalemia Active Problems:   Protein-calorie malnutrition, severe   Atrial fibrillation with RVR   Anemia   Diabetes   Abdominal  pain, other  specified site   Constipation, chronic     Atrial fibrillation with RVR  HR is better but still not optimum. Increase cardizem to q6h on 11/23. Increase BB today. Cardiology is following. Due to recent Dig toxicity will avoid use. On warfarin per pharmacy. INR is therapeutic.  Abdominal Pain secondary to Constipation Given suppository and enema in the ED with good response. Now has loose stools. Will cut back on Miralax. Continue Senna. TSH was normal recently. Enemas as needed.  Hypokalemia  Resolved. Etiology was likely due to poor po intake. Patient was not on lasix. Also was taken off digoxin the last hospitalization.   Protein-calorie malnutrition, severe  Patient appears extremely malnourished and has severe malnutrition. Reports poor appetite and subjective weight loss. Continue with nutritional supplement. Patient seen by nutrition consult during previous hospitalization. Requested further nutritional consult to assess requirement.    Anemia  Secondary to iron deficiency. On Iron supplements.   Volume overload on CT scan  Patient appears clinically euvolemic. Recent 2-D echo was unremarkable. Patient was started on IV fluids in the ED which has been discontinued. Hold off on Lasix for now.  Code Status: Full Code  DVT Prophylaxis: On warfarin    Family Communication: Discussed with patient. Her husband is hospitalized as well.  Disposition Plan: Await improvement in HR. OOB. PT/OT. Not ready for discharge. Anticipate discharge in 1-2 days.    LOS: 3 days   [facility]  [facility] Pager [phone] 03/26/2013, 9:21 AM  If 8PM-8AM, please contact night-coverage at [email or web address], password [facility]

## 2013-03-27 LAB — PROTIME-INR: Prothrombin Time: 32.5 seconds — ABNORMAL HIGH (ref 11.6–15.2)

## 2013-03-27 MED ORDER — POLYETHYLENE GLYCOL 3350 17 G PO PACK: 17.0000 g | PACK | Freq: Every day | ORAL | Status: AC

## 2013-03-27 MED ORDER — OXYCODONE-ACETAMINOPHEN 7.5-325 MG PO TABS: 1.0000 | ORAL_TABLET | Freq: Three times a day (TID) | ORAL | Status: DC | PRN

## 2013-03-27 MED ORDER — DILTIAZEM HCL ER COATED BEADS 120 MG PO CP24
120.0000 mg | ORAL_CAPSULE | Freq: Two times a day (BID) | ORAL | Status: DC
  Administered 2013-03-27 – 2013-03-28 (×3): 120 mg via ORAL
  Filled 2013-03-27 (×5): qty 1

## 2013-03-27 MED ORDER — DILTIAZEM HCL ER COATED BEADS 120 MG PO CP24: 120.0000 mg | ORAL_CAPSULE | Freq: Two times a day (BID) | ORAL | Status: DC

## 2013-03-27 MED ORDER — SENNOSIDES-DOCUSATE SODIUM 8.6-50 MG PO TABS: 2.0000 | ORAL_TABLET | Freq: Two times a day (BID) | ORAL | Status: AC

## 2013-03-27 MED ORDER — METOPROLOL TARTRATE 50 MG PO TABS: 100.0000 mg | ORAL_TABLET | Freq: Two times a day (BID) | ORAL | Status: DC

## 2013-03-27 NOTE — Progress Notes (Signed)
CSW spoke with [facility] who stated that they were unable to receive insurance authorization for pt to d/c to SNF today. Per facility, anticipate that insurance authorization will be received tomorrow as facility has been in communication with pt insurance, [employer].  CSW updated pt and multiple family members at bedside.  CSW updated MD.  CSW to facilitate pt discharge needs when insurance authorization received. Anticipate tomorrow.  Jacklynn Lewis, MSW, LCSWA  Clinical Social Work Coverage for [employer], [place] [phone]

## 2013-03-27 NOTE — Discharge Summary (Signed)
[facility]  Physician Discharge Summary   Patient ID: HERLINDA HEADY MRN: [id number] DOB/AGE: 1922-08-14 79 y.o.  Admit date: 03/23/2013 Discharge date: 03/27/2013  PCP: Pamella Pert, MD  DISCHARGE DIAGNOSES:  Principal Problem:   Hypokalemia Active Problems:   Protein-calorie malnutrition, severe   Atrial fibrillation with RVR   Anemia   Diabetes   Abdominal  pain, other specified site   Constipation, chronic   RECOMMENDATIONS FOR OUTPATIENT FOLLOW UP: 1. Pt/INR to be checked daily till INR stable between 2 and 3.  DISCHARGE CONDITION: fair  Diet recommendation: Heart Healthy  Filed Weights   03/23/13 1920  Weight: 45.768 kg (100 lb 14.4 oz)    INITIAL HISTORY: 79 y/o [place] American female with history of A. fib on Coumadin, severe protein malnutrition, hypertension, iron deficiency anemia, GERD, chronic constipation who presented to the ED with worsening abdominal pain for several days. Patient was admitted to the hospital one month back with digitalis toxicity and hypotension requiring IV dopamine briefly. She was taken off digoxin and discharged to rehabilitation. Patient returned home from rehabilitation about 2 days prior to admission. Patient reported that she has chronic constipation and has a regular bowel movement usually every 3 days.  Consultations:  Cardiology  Procedures:  None  HOSPITAL COURSE:   Atrial fibrillation with RVR  HR is better controlled. Does climb to 120's once in a while. Will maintain on current regimen. Will change to LA cardizem. Continue current dose of metoprolol. Seen by cardiology and no new recommendations. Due to recent Dig toxicity will avoid use.  INR is slightly supratherapeutic. Will likely need to skip tonight's dose and recheck INR daily till it is stable between 2 and 3.    Abdominal Pain was secondary to Constipation  Nothing concerning was noted on CT. She was given suppository and enema in the ED with  good response. Now has loose stools. Will cut back on Miralax. Continue Senna. TSH was normal recently. Enemas as needed.   Hypokalemia  Resolved. Etiology was likely due to poor po intake. Patient was not on lasix. Also was taken off digoxin the last hospitalization.   Protein-calorie malnutrition, severe  Patient appears extremely malnourished and has severe malnutrition. Reports poor appetite and subjective weight loss. Continue with nutritional supplement. Patient seen by nutrition consult during previous hospitalization.  Anemia  Secondary to iron deficiency. On Iron supplements.   Volume overload on CT scan  Patient appears clinically euvolemic. Recent 2-D echo was unremarkable. Patient was started on IV fluids in the ED which has been discontinued. Hold off on Lasix for now.   She was seen by PT and OT and they recommended SNF as patient is deconditioned. CSW will address. Possible discharge later today to SNF.   PERTINENT LABS:  The results of significant diagnostics from this hospitalization (including imaging, microbiology, ancillary and laboratory) are listed below for reference.    Microbiology: Recent Results (from the past 240 hour(s))  URINE CULTURE     Status: None   Collection Time    03/23/13  7:42 PM      Result Value Range Status   Specimen Description URINE, CLEAN CATCH   Final   Special Requests NONE   Final   Culture  Setup Time     Final   Value: 03/24/2013 03:16     Performed at [facility] Count     Final   Value: NO GROWTH     Performed at [facility]   Culture     Final   Value: NO GROWTH     Performed at [facility]   Report Status 03/25/2013 FINAL   Final     Labs: Basic Metabolic Panel:  Recent Labs Lab 03/23/13 1610 03/23/13 1830 03/24/13 1321 03/25/13 1146 03/26/13 0649  NA 139  --  138 136 139  K 2.6*  --  4.3 4.0 3.9  CL 103  --  109 109 108  CO2 22  --  15* 17* 19  GLUCOSE 122*  --  193*  128* 137*  BUN 9  --  13 12 10   CREATININE 1.01  --  1.09 1.16* 1.10  CALCIUM 9.2  --  8.6 8.7 9.0  MG  --  1.7  --   --   --    Liver Function Tests:  Recent Labs Lab 03/23/13 1610  AST 16  ALT 6  ALKPHOS 58  BILITOT 0.6  PROT 6.3  ALBUMIN 1.8*    Recent Labs Lab 03/23/13 1610  LIPASE 16   CBC:  Recent Labs Lab 03/23/13 1610 03/25/13 1146 03/26/13 0649  WBC 12.3* 11.5* 10.6*  NEUTROABS 9.7*  --   --   HGB 9.9* 9.4* 10.6*  HCT 31.5* 30.3* 33.7*  MCV 91.8 93.5 92.8  PLT 464* 436* 389   BNP: BNP (last 3 results)  Recent Labs  02/18/13 0430  PROBNP 16447.0*     IMAGING STUDIES Ct Abdomen Pelvis Wo Contrast  03/23/2013   CLINICAL DATA:  Abdomen pain, no IV access, hypertension, breast cancer, hernia repair  EXAM: CT ABDOMEN AND PELVIS WITHOUT CONTRAST  TECHNIQUE: Multidetector CT imaging of the abdomen and pelvis was performed following the standard protocol without intravenous contrast.  COMPARISON:  02/15/2013  FINDINGS: There is small right and smaller left pleural effusion. These are new when compared to the prior study. Lung bases are otherwise clear except for mild atelectasis. The heart is enlarged.  There is evidence of hiatal hernia repair. There is a nonobstructive bowel gas pattern. There is diverticulosis of the distal descending and sigmoid colon. No diverticulitis appreciated. There is fecal distention of the rectum to a maximal diameter of 7 cm.  In the non contrasted state, the liver, gallbladder, spleen, pancreas, and adrenal glands are normal. The kidneys are somewhat atrophic but otherwise normal. The aorta is extensively calcified. There is a small volume of free fluid in the pelvis. The appendix appears normal. The bladder is normal. A posterior fundus mass in the uterus measuring about 3 cm is likely a fibroid. The uterus appears somewhat enlarged consistent with fibroid involvement.  There is a mild T12 compression deformity. This is not  significantly different from the prior study.  IMPRESSION: Fluid overload with pleural effusions and small volume ascites.  Fecal impaction in the rectum.  Stable T12 compression deformity.   Electronically Signed   By: Esperanza Heir M.D.   On: 03/23/2013 16:00    DISCHARGE EXAMINATION: Filed Vitals:   03/26/13 1415 03/26/13 1510 03/26/13 2135 03/27/13 0604  BP: 117/81 116/82 101/70 121/68  Pulse:  89 101 81  Temp:  97.4 F (36.3 C) 98.4 F (36.9 C) 98.2 F (36.8 C)  TempSrc:  Oral Oral Oral  Resp:  18 20 16   Height:      Weight:      SpO2:  100% 99% 98%   General appearance: alert, cooperative, appears stated age, distracted and no distress Resp: clear to auscultation bilaterally Cardio:  regular rate and rhythm, S1, S2 normal, no murmur, click, rub or gallop GI: soft, non-tender; bowel sounds normal; no masses,  no organomegaly Extremities: extremities normal, atraumatic, no cyanosis or edema  DISPOSITION: SNF    ALLERGIES: No Known Allergies  Current Discharge Medication List    START taking these medications   Details  diltiazem (CARDIZEM CD) 120 MG 24 hr capsule Take 1 capsule (120 mg total) by mouth 2 (two) times daily. Qty: 60 capsule, Refills: 2    senna-docusate (SENOKOT-S) 8.6-50 MG per tablet Take 2 tablets by mouth 2 (two) times daily. Qty: 60 tablet, Refills: 0      CONTINUE these medications which have CHANGED   Details  metoprolol (LOPRESSOR) 50 MG tablet Take 2 tablets (100 mg total) by mouth 2 (two) times daily. Qty: 120 tablet, Refills: 2    oxyCODONE-acetaminophen (PERCOCET) 7.5-325 MG per tablet Take 1 tablet by mouth every 8 (eight) hours as needed for pain. Qty: 30 tablet, Refills: 0    polyethylene glycol (MIRALAX / GLYCOLAX) packet Take 17 g by mouth daily. Qty: 14 each, Refills: 0      CONTINUE these medications which have NOT CHANGED   Details  Fe Fum-FePoly-Vit C-Vit B3 (INTEGRA PO) Take 1 capsule by mouth daily.    feeding  supplement, RESOURCE BREEZE, (RESOURCE BREEZE) LIQD Take 1 Container by mouth 2 (two) times daily between meals. Refills: 0    mirtazapine (REMERON) 15 MG tablet Take 15 mg by mouth at bedtime.    pantoprazole (PROTONIX) 40 MG tablet Take 40 mg by mouth daily.    potassium chloride SA (K-DUR,KLOR-CON) 20 MEQ tablet Take 20 mEq by mouth daily.    silver sulfADIAZINE (SILVADENE) 1 % cream Apply topically 2 (two) times daily. Qty: 50 g, Refills: 0    vitamin B-12 (CYANOCOBALAMIN) 1000 MCG tablet Take 1,000 mcg by mouth daily.    warfarin (COUMADIN) 5 MG tablet Take 0.5-1 tablets (2.5-5 mg total) by mouth daily. Skip today's dose then take 5mg  on Mondays and Wednesdays and takes 2.5mg  on all other days close outpatient INR monitoring at nursing home       Follow-up Information   Follow up with Pamella Pert, MD. Schedule an appointment as soon as possible for a visit in 2 weeks.   Specialty:  Cardiology   Contact information:   [address] [name] [address] [phone]       TOTAL DISCHARGE TIME: 35 mins  [facility]  [facility] Pager [phone]  03/27/2013, 9:59 AM

## 2013-03-27 NOTE — Progress Notes (Addendum)
Clinical Social Work Department CLINICAL SOCIAL WORK PLACEMENT NOTE 03/27/2013  Patient:  Lisa Nixon, Lisa Nixon  Account Number:  [id number] Admit date:  03/23/2013  Clinical Social Worker:  Jacelyn Grip  Date/time:  03/27/2013 11:08 AM  Clinical Social Work is seeking post-discharge placement for this patient at the following level of care:   SKILLED NURSING   (*CSW will update this form in Epic as items are completed)   03/27/2013  Patient/family provided with [facility] Department of Clinical Social Work's list of facilities offering this level of care within the geographic area requested by the patient (or if unable, by the patient's family).  03/27/2013  Patient/family informed of their freedom to choose among providers that offer the needed level of care, that participate in Medicare, Medicaid or managed care program needed by the patient, have an available bed and are willing to accept the patient.  03/27/2013  Patient/family informed of [facility]' ownership interest in [facility], as well as of the fact that they are under no obligation to receive care at this facility.  PASARR submitted to EDS on 03/27/2013 PASARR number received from EDS on 03/27/2013  FL2 transmitted to all facilities in geographic area requested by pt/family on  03/27/2013 FL2 transmitted to all facilities within larger geographic area on   Patient informed that his/her managed care company has contracts with or will negotiate with  certain facilities, including the following:     Patient/family informed of bed offers received:  03/27/2013 Patient chooses bed at [facility] Physician recommends and patient chooses bed at    Patient to be transferred to  on  Home with Home Health Services until insurance auth given to [employer] on 03/28/2013 Patient to be transferred to facility by pt family via private vehicle.  The following physician request were entered in  Epic:   Additional Comments:   Jacklynn Lewis, MSW, LCSWA  Clinical Social Work Coverage for [employer], [place] [phone]

## 2013-03-27 NOTE — Progress Notes (Signed)
ANTICOAGULATION CONSULT NOTE - Follow up  Pharmacy Consult for Warfarin  Indication: Hx of PE and Afib  No Known Allergies  Patient Measurements: Height: 5\' 4"  (162.6 cm) Weight: 100 lb 14.4 oz (45.768 kg) IBW/kg (Calculated) : 54.7  Vital Signs: Temp: 98.2 F (36.8 C) (11/25 0604) Temp src: Oral (11/25 0604) BP: 121/68 mmHg (11/25 0604) Pulse Rate: 81 (11/25 0604)  Labs:  Recent Labs  03/25/13 1146 03/26/13 0649 03/27/13 0501  HGB 9.4* 10.6*  --   HCT 30.3* 33.7*  --   PLT 436* 389  --   LABPROT 34.1* 30.3* 32.5*  INR 3.54* 3.03* 3.32*  CREATININE 1.16* 1.10  --     Medications:  Scheduled:  . diltiazem  120 mg Oral BID  . feeding supplement (ENSURE COMPLETE)  237 mL Oral BID BM  . feeding supplement (RESOURCE BREEZE)  1 Container Oral Q24H  . iron polysaccharides  150 mg Oral Daily  . metoprolol  100 mg Oral BID  . mirtazapine  15 mg Oral QHS  . pantoprazole  40 mg Oral Daily  . polyethylene glycol  17 g Oral Daily  . senna-docusate  2 tablet Oral BID  . sodium chloride  3 mL Intravenous Q12H  . vitamin B-12  1,000 mcg Oral Daily  . Warfarin - Pharmacist Dosing Inpatient   Does not apply q1800   Assessment: 5 yoF admitted on 11/21 with abdominal pain on chronic warfarin anticoagulation for history of Afib and PE.  Pharmacy consulted to continue warfarin dosing inpatient.  PTA: warfarin 2.5mg  daily except 5 mg MonWed, last dose 11/20 @ 1600  INR 3.32, increased to supratherapeutic despite low dose 11/24 and holding on 11/23.  CBC: Hgb 10.6 is improved, Plt remain wnl  No bleeding or complications noted  Diet:  Heart healthy.  Only 25-50% of meals documented as eaten yesterday.  Goal of Therapy:  INR 2-3   Plan:   Hold warfarin today.  Daily INR   Lynann Beaver PharmD, BCPS Pager [phone] 03/27/2013 1:38 PM

## 2013-03-27 NOTE — Progress Notes (Signed)
Clinical Social Work Department BRIEF PSYCHOSOCIAL ASSESSMENT 03/27/2013  Patient:  Lisa Nixon, Lisa Nixon     Account Number:  [id number]     Admit date:  03/23/2013  Clinical Social Worker:  Jacelyn Grip  Date/Time:  03/27/2013 11:00 AM  Referred by:  Physician  Date Referred:  03/27/2013 Referred for  SNF Placement   Other Referral:   Interview type:  Patient Other interview type:   and patient family via telephone    PSYCHOSOCIAL DATA Living Status:  FAMILY Admitted from facility:   Level of care:   Primary support name:  Carney Bern Bunn/daughter Primary support relationship to patient:  CHILD, ADULT Degree of support available:   adequate    CURRENT CONCERNS Current Concerns  Post-Acute Placement   Other Concerns:    SOCIAL WORK ASSESSMENT / PLAN CSW received referral that pt admitted from [facility] and medically ready for discharge today.    CSW met with pt at bedside to discuss. Pt stated that she was recently at [facility], but returned home last week. CSW discussed recommendation for rehab and pt is agreeable to returning to [facility]. Pt asked CSW to contact pt daughter, Carney Bern to discuss.    CSW spoke to pt daughter, Carney Bern who discussed that pt discharged from [facility] last Wednesday secondary to insurance not continuing to cover stay at [facility]. Pt daughter discussed that pt went to PCP on Friday and was readmitted to the hospital at that time. CSW discussed recommendation for SNF and pt daughter agrees with return to [facility].    CSW contacted [facility] and sent pt clinicals. [facility] planned to submit clinicals for insurance authorization.    CSW to continue to follow and facilitate pt discharge needs once insurance authorization received.   Assessment/plan status:  Psychosocial Support/Ongoing Assessment of Needs Other assessment/ plan:   discharge planning    Information/referral to community resources:   Referral to [facility]    PATIENT'S/FAMILY'S RESPONSE TO PLAN OF CARE: Pt alert and oriented x 3. Pt had period of confusion, but was able to actively participate in assessment. Pt daughter appears supportive and actively involved in pt care. All in agreement for [facility] and facility has submitted for insurance authorization.     Jacklynn Lewis, MSW, LCSWA  Clinical Social Work [phone]

## 2013-03-27 NOTE — Progress Notes (Signed)
CSW followed up with [facility].   [facility] has submitted pt case to [employer] and awaiting insurance authorization for SNF.  CSW will continue to follow up with [facility] and facilitate pt discharge needs when insurance authorization received.  Jacklynn Lewis, MSW, LCSWA  Clinical Social Work Coverage for [employer] [phone]

## 2013-03-28 DIAGNOSIS — E119 Type 2 diabetes mellitus without complications: Secondary | ICD-10-CM

## 2013-03-28 LAB — PROTIME-INR
INR: 2.74 — ABNORMAL HIGH (ref 0.00–1.49)
Prothrombin Time: 28.1 seconds — ABNORMAL HIGH (ref 11.6–15.2)

## 2013-03-28 MED ORDER — WARFARIN SODIUM 1 MG PO TABS
1.0000 mg | ORAL_TABLET | Freq: Once | ORAL | Status: AC
  Administered 2013-03-28: 18:00:00 1 mg via ORAL
  Filled 2013-03-28: qty 1

## 2013-03-28 NOTE — Care Management Note (Signed)
Cm spoke with patient's adult daughter Carola Frost at [phone] concerning discharge planning. CSW awaiting insurance approval for SNF. Pt unable to remain in hospital until approval. Pt's daughter agrees pt can discharge home with [facility] until approved with SNF. Pt active with [facility]. [facility] unable to resume care Monday 03/28/13. Pt's daughter agrees with plan of care.    Roxy Manns Fitzpatrick [place],RN,MSN [phone]

## 2013-03-28 NOTE — Progress Notes (Signed)
CSW continuing to follow for discharge to [facility].  CSW spoke to facility who stated that they have not yet received insurance authorization from [facility].  CSW contacted [facility] case manager managing the authorization. [employer] Case Manager stated that the case was pending and she will attempt to review case for authorization today, but could not guarantee.   CSW will continue to follow up with [employer] and assist with pt discharge planning needs when insurance authorization received.  Jacklynn Lewis, MSW, LCSWA  Clinical Social Work [phone]

## 2013-03-28 NOTE — Progress Notes (Signed)
CSW was following for disposition to [facility].  CSW spoke to [employer] who stated that pt insurance has not yet provided authorization for pt to admit to SNF. CSW also made attempts to speak with pt insurance company without success in expediting SNF approval.  CSW discussed with pt and pt daughter that given that insurance company has not yet provided approval to [facility] that only option at this time would be for pt to discharge home with [facility] services until [employer] received insurance approval. Pt daughter is agreeable to pt returning home with [facility] services at this time and awaiting notification from [employer] that facility has received insurance approval and can transition from home into SNF. Pt daughter stated that she would like for [facility] to resume [facility] services until pt able to admit to SNF.  CSW notified RNCM who assisted with arranging HH services.  CSW notified RN and MD.  Pt discharging today to home with home health services until [employer] receives authorization from pt insurance company to admit to SNF.  No further social work needs identified at this time.  CSW signing off.   Jacklynn Lewis, MSW, LCSWA  Clinical Social Work Coverage for [employer], [place] [phone]

## 2013-03-28 NOTE — Progress Notes (Addendum)
ANTICOAGULATION CONSULT NOTE - Follow up  Pharmacy Consult for Warfarin  Indication: Hx of PE and Afib  No Known Allergies  Patient Measurements: Height: 5\' 4"  (162.6 cm) Weight: 100 lb 14.4 oz (45.768 kg) IBW/kg (Calculated) : 54.7  Vital Signs: Temp: 98.4 F (36.9 C) (11/26 0510) Temp src: Oral (11/26 0510) BP: 120/71 mmHg (11/26 0510) Pulse Rate: 76 (11/26 0510)  Labs:  Recent Labs  03/26/13 0649 03/27/13 0501 03/28/13 0350  HGB 10.6*  --   --   HCT 33.7*  --   --   PLT 389  --   --   LABPROT 30.3* 32.5* 28.1*  INR 3.03* 3.32* 2.74*  CREATININE 1.10  --   --     Medications:  Scheduled:  . diltiazem  120 mg Oral BID  . feeding supplement (ENSURE COMPLETE)  237 mL Oral BID BM  . feeding supplement (RESOURCE BREEZE)  1 Container Oral Q24H  . iron polysaccharides  150 mg Oral Daily  . metoprolol  100 mg Oral BID  . mirtazapine  15 mg Oral QHS  . pantoprazole  40 mg Oral Daily  . polyethylene glycol  17 g Oral Daily  . senna-docusate  2 tablet Oral BID  . sodium chloride  3 mL Intravenous Q12H  . vitamin B-12  1,000 mcg Oral Daily  . Warfarin - Pharmacist Dosing Inpatient   Does not apply q1800   Assessment: 32 yoF admitted on 11/21 with abdominal pain on chronic warfarin anticoagulation for history of Afib and PE.  Pharmacy consulted to continue warfarin dosing inpatient.  PTA: warfarin 2.5mg  daily except 5 mg MonWed, last dose 11/20 @ 1600  INR 2.74, now therapeutic after INR supratherapeutic despite low dose 11/24 and holding on 11/23.  CBC: Hgb 10.6 is improved, Plt remain wnl  No bleeding or complications noted  Diet:  Heart healthy.  Only 25-50% of meals documented as eaten yesterday.  Goal of Therapy:  INR 2-3   Plan:   Needs to use lower dose since INR > 3 with home dosing. Give coumadin 1mg  PO  X 1 tonight  suggest using lower dose at discharge with close INR follow-up (consider 1.25mg   Or 1/2 tab of 2.5mg )  Likely D/C to SNF  today  Daily INR  Juliette Alcide, PharmD, BCPS.   Pager: [phone] 03/28/2013 11:56 AM

## 2013-03-28 NOTE — Progress Notes (Signed)
[facility] PROGRESS NOTE  Lisa Nixon EAV:[id number] DOB: 03/06/1923 DOA: 03/23/2013 PCP: Pamella Pert, MD I have seen and examined pt who is an 79 y/o [place] American female with history of A. fib on Coumadin, severe protein malnutrition, hypertension, iron deficiency anemia, GERD, chronic constipation admitted with abd pain thought to be due o constipation as w/u was o/w neg. Pt was also found to be in AFIB with RVR this hospital stay>>treated withcardizem and maintained on coumadin with INR in therapeutic range now and plan was to d/c today. Pt with no complaints at this time and remains medically stable for d/c please see the full d/c summary of 11/25 for details of hospital stay and follow up care , unchanged.    Kela Millin  [facility] Pager [phone]. If 7PM-7AM, please contact night-coverage at [email or web address], password [facility] 03/28/2013, 1:48 PM  LOS: 5 days

## 2013-03-28 NOTE — Progress Notes (Signed)
Physical Therapy Treatment Patient Details Name: Lisa Nixon MRN: [id number] DOB: Dec 04, 1922 Today's Date: 03/28/2013 Time: 2841-3244 PT Time Calculation (min): 22 min  PT Assessment / Plan / Recommendation  History of Present Illness Pt admit with malnutrition, AMS, afib as well. DC'd from SNF 2 days PTA   PT Comments   Pt is  Able to ambulate with 1 person  To steady balance during ambulation and  continues to be deconditioned. Pt incontinent of BM in bed. Pt will benefit from SNF to improve in function/safety  Follow Up Recommendations  SNF;Supervision/Assistance - 24 hour     Does the patient have the potential to tolerate intense rehabilitation     Barriers to Discharge        Equipment Recommendations       Recommendations for Other Services    Frequency Min 3X/week   Progress towards PT Goals Progress towards PT goals: Progressing toward goals  Plan Current plan remains appropriate (if DC'd to home will need 1;1 assistance when up)    Precautions / Restrictions Precautions Precautions: Fall Precaution Comments: incontinenece   Pertinent Vitals/Pain     Mobility  Bed Mobility Bed Mobility: Rolling Right;Rolling Left Rolling Right: 5: Supervision;With rail Rolling Left: With rail;5: Supervision Supine to Sit: HOB flat;4: Min assist Sitting - Scoot to Edge of Bed: 4: Min guard Sit to Supine: 4: Min guard;HOB flat Details for Bed Mobility Assistance: extra time for getting up right and placing legs onto bed. Transfers Sit to Stand: 3: Mod assist;From bed;With upper extremity assist Stand to Sit: 4: Min guard;To bed;With upper extremity assist Ambulation/Gait Ambulation/Gait Assistance: 4: Min assist;3: Mod assist Ambulation Distance (Feet): 25 Feet Ambulation/Gait Assistance Details: pt wit6h decresed balance upon standing up. Pt requires 1 person assist for safety Gait Pattern: Step-through pattern;Lateral trunk lean to left Gait velocity: decr     Exercises Total Joint Exercises Short Arc Quad: AROM;Both;10 reps Heel Slides: AAROM;Both;10 reps Hip ABduction/ADduction: AROM;Both;10 reps   PT Diagnosis:    PT Problem List:   PT Treatment Interventions:     PT Goals (current goals can now be found in the care plan section)    Visit Information  Last PT Received On: 03/28/13 Assistance Needed: +1 History of Present Illness: Pt admit with malnutrition, AMS, afib as well. DC'd from SNF 2 days PTA    Subjective Data      Cognition  Cognition Arousal/Alertness: Awake/alert Behavior During Therapy: WFL for tasks assessed/performed Overall Cognitive Status: Within Functional Limits for tasks assessed    Balance  Balance Balance Assessed: Yes Static Standing Balance Static Standing - Balance Support: Bilateral upper extremity supported Static Standing - Level of Assistance: 4: Min assist Static Standing - Comment/# of Minutes: at RW.  End of Session PT - End of Session Equipment Utilized During Treatment: Gait belt Activity Tolerance: Patient tolerated treatment well Patient left: in bed;with call bell/phone within reach;with bed alarm set Nurse Communication: Mobility status   GP     Rada Hay 03/28/2013, 2:12 PM Blanchard Kelch PT [phone]

## 2013-04-10 ENCOUNTER — Encounter (HOSPITAL_COMMUNITY): Payer: Self-pay | Admitting: Emergency Medicine

## 2013-04-10 ENCOUNTER — Inpatient Hospital Stay (HOSPITAL_COMMUNITY)
Admission: EM | Admit: 2013-04-10 | Discharge: 2013-04-16 | DRG: 308 | Disposition: A | Discharge status: Skilled Nursing Facility | Payer: Medicare FFS | Attending: Family Medicine | Admitting: Family Medicine

## 2013-04-10 ENCOUNTER — Emergency Department (HOSPITAL_COMMUNITY): Payer: Medicare FFS

## 2013-04-10 DIAGNOSIS — D649 Anemia, unspecified: Secondary | ICD-10-CM

## 2013-04-10 DIAGNOSIS — I129 Hypertensive chronic kidney disease with stage 1 through stage 4 chronic kidney disease, or unspecified chronic kidney disease: Secondary | ICD-10-CM | POA: Diagnosis present

## 2013-04-10 DIAGNOSIS — E7800 Pure hypercholesterolemia, unspecified: Secondary | ICD-10-CM | POA: Diagnosis present

## 2013-04-10 DIAGNOSIS — M545 Low back pain, unspecified: Secondary | ICD-10-CM | POA: Diagnosis present

## 2013-04-10 DIAGNOSIS — Q068 Other specified congenital malformations of spinal cord: Secondary | ICD-10-CM

## 2013-04-10 DIAGNOSIS — K5641 Fecal impaction: Secondary | ICD-10-CM | POA: Diagnosis present

## 2013-04-10 DIAGNOSIS — Z853 Personal history of malignant neoplasm of breast: Secondary | ICD-10-CM

## 2013-04-10 DIAGNOSIS — E8809 Other disorders of plasma-protein metabolism, not elsewhere classified: Secondary | ICD-10-CM | POA: Diagnosis present

## 2013-04-10 DIAGNOSIS — F3289 Other specified depressive episodes: Secondary | ICD-10-CM | POA: Diagnosis present

## 2013-04-10 DIAGNOSIS — IMO0001 Reserved for inherently not codable concepts without codable children: Secondary | ICD-10-CM

## 2013-04-10 DIAGNOSIS — D638 Anemia in other chronic diseases classified elsewhere: Secondary | ICD-10-CM | POA: Diagnosis present

## 2013-04-10 DIAGNOSIS — E871 Hypo-osmolality and hyponatremia: Secondary | ICD-10-CM | POA: Diagnosis present

## 2013-04-10 DIAGNOSIS — L988 Other specified disorders of the skin and subcutaneous tissue: Secondary | ICD-10-CM | POA: Diagnosis present

## 2013-04-10 DIAGNOSIS — I509 Heart failure, unspecified: Secondary | ICD-10-CM | POA: Diagnosis present

## 2013-04-10 DIAGNOSIS — R6881 Early satiety: Secondary | ICD-10-CM

## 2013-04-10 DIAGNOSIS — D509 Iron deficiency anemia, unspecified: Secondary | ICD-10-CM | POA: Diagnosis present

## 2013-04-10 DIAGNOSIS — M1990 Unspecified osteoarthritis, unspecified site: Secondary | ICD-10-CM | POA: Diagnosis present

## 2013-04-10 DIAGNOSIS — R32 Unspecified urinary incontinence: Secondary | ICD-10-CM | POA: Diagnosis present

## 2013-04-10 DIAGNOSIS — K219 Gastro-esophageal reflux disease without esophagitis: Secondary | ICD-10-CM | POA: Diagnosis present

## 2013-04-10 DIAGNOSIS — E876 Hypokalemia: Secondary | ICD-10-CM | POA: Diagnosis present

## 2013-04-10 DIAGNOSIS — E119 Type 2 diabetes mellitus without complications: Secondary | ICD-10-CM | POA: Diagnosis present

## 2013-04-10 DIAGNOSIS — R64 Cachexia: Secondary | ICD-10-CM | POA: Diagnosis present

## 2013-04-10 DIAGNOSIS — E43 Unspecified severe protein-calorie malnutrition: Secondary | ICD-10-CM | POA: Diagnosis present

## 2013-04-10 DIAGNOSIS — Z7901 Long term (current) use of anticoagulants: Secondary | ICD-10-CM

## 2013-04-10 DIAGNOSIS — Z880 Allergy status to penicillin: Secondary | ICD-10-CM

## 2013-04-10 DIAGNOSIS — G8929 Other chronic pain: Secondary | ICD-10-CM | POA: Diagnosis present

## 2013-04-10 DIAGNOSIS — F329 Major depressive disorder, single episode, unspecified: Secondary | ICD-10-CM | POA: Diagnosis present

## 2013-04-10 DIAGNOSIS — D72829 Elevated white blood cell count, unspecified: Secondary | ICD-10-CM | POA: Diagnosis present

## 2013-04-10 DIAGNOSIS — R109 Unspecified abdominal pain: Secondary | ICD-10-CM | POA: Diagnosis present

## 2013-04-10 DIAGNOSIS — I959 Hypotension, unspecified: Secondary | ICD-10-CM | POA: Diagnosis present

## 2013-04-10 DIAGNOSIS — I4891 Unspecified atrial fibrillation: Principal | ICD-10-CM | POA: Diagnosis present

## 2013-04-10 DIAGNOSIS — I5033 Acute on chronic diastolic (congestive) heart failure: Secondary | ICD-10-CM | POA: Diagnosis present

## 2013-04-10 DIAGNOSIS — R627 Adult failure to thrive: Secondary | ICD-10-CM | POA: Diagnosis present

## 2013-04-10 DIAGNOSIS — Z515 Encounter for palliative care: Secondary | ICD-10-CM

## 2013-04-10 DIAGNOSIS — K5900 Constipation, unspecified: Secondary | ICD-10-CM

## 2013-04-10 DIAGNOSIS — N189 Chronic kidney disease, unspecified: Secondary | ICD-10-CM | POA: Diagnosis present

## 2013-04-10 DIAGNOSIS — D473 Essential (hemorrhagic) thrombocythemia: Secondary | ICD-10-CM | POA: Diagnosis present

## 2013-04-10 DIAGNOSIS — Z681 Body mass index (BMI) 19 or less, adult: Secondary | ICD-10-CM

## 2013-04-10 DIAGNOSIS — Z923 Personal history of irradiation: Secondary | ICD-10-CM

## 2013-04-10 DIAGNOSIS — Z791 Long term (current) use of non-steroidal anti-inflammatories (NSAID): Secondary | ICD-10-CM

## 2013-04-10 DIAGNOSIS — Z79899 Other long term (current) drug therapy: Secondary | ICD-10-CM | POA: Diagnosis not present

## 2013-04-10 DIAGNOSIS — E785 Hyperlipidemia, unspecified: Secondary | ICD-10-CM | POA: Diagnosis present

## 2013-04-10 DIAGNOSIS — Z86711 Personal history of pulmonary embolism: Secondary | ICD-10-CM | POA: Diagnosis not present

## 2013-04-10 DIAGNOSIS — K5909 Other constipation: Secondary | ICD-10-CM | POA: Diagnosis present

## 2013-04-10 DIAGNOSIS — D75839 Thrombocytosis, unspecified: Secondary | ICD-10-CM | POA: Diagnosis present

## 2013-04-10 DIAGNOSIS — T3442XA Frostbite with tissue necrosis of left arm, initial encounter: Secondary | ICD-10-CM

## 2013-04-10 LAB — CBC
HCT: 29.2 % — ABNORMAL LOW (ref 36.0–46.0)
Hemoglobin: 9.5 g/dL — ABNORMAL LOW (ref 12.0–15.0)
MCH: 28.9 pg (ref 26.0–34.0)
MCHC: 32.5 g/dL (ref 30.0–36.0)
RDW: 17 % — ABNORMAL HIGH (ref 11.5–15.5)

## 2013-04-10 LAB — PROTIME-INR
INR: 3.17 — ABNORMAL HIGH (ref 0.00–1.49)
Prothrombin Time: 31.4 seconds — ABNORMAL HIGH (ref 11.6–15.2)

## 2013-04-10 LAB — COMPREHENSIVE METABOLIC PANEL
BUN: 17 mg/dL (ref 6–23)
CO2: 22 mEq/L (ref 19–32)
Calcium: 9 mg/dL (ref 8.4–10.5)
Creatinine, Ser: 1.29 mg/dL — ABNORMAL HIGH (ref 0.50–1.10)
GFR calc Af Amer: 42 mL/min — ABNORMAL LOW (ref >90)
GFR calc non Af Amer: 36 mL/min — ABNORMAL LOW (ref >90)
Glucose, Bld: 107 mg/dL — ABNORMAL HIGH (ref 70–99)
Total Protein: 6.5 g/dL (ref 6.0–8.3)

## 2013-04-10 LAB — PRO B NATRIURETIC PEPTIDE: Pro B Natriuretic peptide (BNP): 15766 pg/mL — ABNORMAL HIGH (ref 0–450)

## 2013-04-10 LAB — MAGNESIUM: Magnesium: 1.4 mg/dL — ABNORMAL LOW (ref 1.5–2.5)

## 2013-04-10 LAB — POCT I-STAT TROPONIN I: Troponin i, poc: 0.01 ng/mL (ref 0.00–0.08)

## 2013-04-10 MED ORDER — METOPROLOL TARTRATE 1 MG/ML IV SOLN: 5.0000 mg | Freq: Once | INTRAVENOUS | Status: DC

## 2013-04-10 MED ORDER — PANTOPRAZOLE SODIUM 40 MG PO TBEC
40.0000 mg | DELAYED_RELEASE_TABLET | Freq: Every day | ORAL | Status: DC
  Administered 2013-04-11 – 2013-04-16 (×6): 40 mg via ORAL
  Filled 2013-04-10 (×6): qty 1

## 2013-04-10 MED ORDER — DILTIAZEM HCL 25 MG/5ML IV SOLN: 10.0000 mg | Freq: Once | INTRAVENOUS | Status: DC

## 2013-04-10 MED ORDER — SENNOSIDES-DOCUSATE SODIUM 8.6-50 MG PO TABS
2.0000 | ORAL_TABLET | Freq: Two times a day (BID) | ORAL | Status: DC
  Administered 2013-04-11 – 2013-04-16 (×12): 2 via ORAL
  Filled 2013-04-10 (×14): qty 2

## 2013-04-10 MED ORDER — OXYCODONE HCL 5 MG PO TABS: 2.5000 mg | ORAL_TABLET | Freq: Four times a day (QID) | ORAL | Status: DC | PRN

## 2013-04-10 MED ORDER — METOPROLOL TARTRATE 100 MG PO TABS
100.0000 mg | ORAL_TABLET | Freq: Two times a day (BID) | ORAL | Status: DC
  Administered 2013-04-10: 100 mg via ORAL
  Filled 2013-04-10 (×3): qty 1

## 2013-04-10 MED ORDER — ONDANSETRON HCL 4 MG/2ML IJ SOLN: 4.0000 mg | Freq: Four times a day (QID) | INTRAMUSCULAR | Status: DC | PRN

## 2013-04-10 MED ORDER — OXYCODONE-ACETAMINOPHEN 5-325 MG PO TABS: 1.0000 | ORAL_TABLET | Freq: Four times a day (QID) | ORAL | Status: DC | PRN

## 2013-04-10 MED ORDER — DILTIAZEM HCL 25 MG/5ML IV SOLN
10.0000 mg | Freq: Once | INTRAVENOUS | Status: AC
  Administered 2013-04-10: 10 mg via INTRAVENOUS
  Filled 2013-04-10: qty 5

## 2013-04-10 MED ORDER — DILTIAZEM HCL 100 MG IV SOLR
5.0000 mg/h | Freq: Once | INTRAVENOUS | Status: AC
  Administered 2013-04-10: 5 mg/h via INTRAVENOUS

## 2013-04-10 MED ORDER — MIRTAZAPINE 15 MG PO TABS
15.0000 mg | ORAL_TABLET | Freq: Every day | ORAL | Status: DC
  Administered 2013-04-10 – 2013-04-15 (×6): 15 mg via ORAL
  Filled 2013-04-10 (×7): qty 1

## 2013-04-10 MED ORDER — OXYCODONE-ACETAMINOPHEN 7.5-325 MG PO TABS: 1.0000 | ORAL_TABLET | Freq: Four times a day (QID) | ORAL | Status: DC | PRN

## 2013-04-10 MED ORDER — DILTIAZEM LOAD VIA INFUSION
10.0000 mg | Freq: Once | INTRAVENOUS | Status: DC
  Filled 2013-04-10: qty 10

## 2013-04-10 MED ORDER — POLYETHYLENE GLYCOL 3350 17 G PO PACK
17.0000 g | PACK | Freq: Every day | ORAL | Status: DC
  Administered 2013-04-11 – 2013-04-16 (×4): 17 g via ORAL
  Filled 2013-04-10 (×6): qty 1

## 2013-04-10 MED ORDER — DEXTROSE 5 % IV SOLN
5.0000 mg/h | INTRAVENOUS | Status: DC
  Administered 2013-04-11 (×2): 15 mg/h via INTRAVENOUS
  Filled 2013-04-10: qty 100

## 2013-04-10 NOTE — ED Provider Notes (Signed)
CSN: [id number]     Arrival date & time 04/10/13  1604 History   First MD Initiated Contact with Patient 04/10/13 1606     Chief Complaint  Patient presents with  . Hypotension  . Tachycardia   (Consider location/radiation/quality/duration/timing/severity/associated sxs/prior Treatment) HPI  This is an 79 year old female with a history of atrial fibrillation, hypertension and recent admission for hypokalemia who presents with hypotension and tachycardia. Per the patient's daughter, the patient was seen by her primary care physician yesterday. At that time her INR was found to be supratherapeutic at greater than 4. The primary care physician also took her off "the other drug for her heart rate." The daughter states that she's continue to take metoprolol.  Today the patient was visited by her home nurse who found her to be hypotensive and tachycardic. Patient endorses generalized weakness.  Patient reports shortness of breath it comes and goes. It is not exertional. She denies any chest pain.  Review of patient's records reveal a recent hospitalization x2. Initial hospitalization she was found to have dig toxicity. This was discontinued. During her most recent hospitalization patient was initiated on diltiazem. I suspect this is the medication that her primary doctor "took her off of."  The patient's daughter has confirmed that she has not taken diltiazem in greater than 2 days.  Past Medical History  Diagnosis Date  . Atrial fibrillation   . Hyperlipemia   . GERD (gastroesophageal reflux disease)   . Hypertension   . Breast cancer   . Angina at rest    Past Surgical History  Procedure Laterality Date  . Breast surgery    . Lymphadenectomy    . Hiatal hernia repair    . Cesarean section     History reviewed. No pertinent family history. History  Substance Use Topics  . Smoking status: Never Smoker   . Smokeless tobacco: Not on file  . Alcohol Use: No   OB History   Grav Para Term  Preterm Abortions TAB SAB Ect Mult Living                 Review of Systems  Constitutional: Negative for fever.  Respiratory: Positive for shortness of breath. Negative for cough and chest tightness.   Cardiovascular: Negative for chest pain and leg swelling.  Gastrointestinal: Positive for constipation. Negative for nausea, vomiting and abdominal pain.  Genitourinary: Negative for dysuria.  Musculoskeletal: Negative for back pain.  Skin: Negative for wound.  Neurological: Negative for headaches.  Psychiatric/Behavioral: Negative for confusion.  All other systems reviewed and are negative.    Allergies  Penicillins  Home Medications   No current outpatient prescriptions on file. BP 113/74  Pulse 138  Temp(Src) 97.6 F (36.4 C) (Axillary)  Resp 14  Ht 5\' 4"  (1.626 m)  Wt 114 lb 13.8 oz (52.1 kg)  BMI 19.71 kg/m2  SpO2 92% Physical Exam  Nursing note and vitals reviewed. Constitutional: She is oriented to person, place, and time. No distress.  Elderly, frail appearing  HENT:  Head: Normocephalic and atraumatic.  MM dry  Eyes: Pupils are equal, round, and reactive to light.  Neck: Neck supple.  Cardiovascular: Normal heart sounds.   tachycardia  Pulmonary/Chest: Effort normal. No respiratory distress. She has no wheezes. She has no rales.  Abdominal: Soft. Bowel sounds are normal. There is no tenderness.  Musculoskeletal: She exhibits no edema.  Neurological: She is alert and oriented to person, place, and time.  Skin: Skin is warm and dry.  Psychiatric: She has a normal mood and affect.    ED Course  Procedures (including critical care time) Labs Review Labs Reviewed  PROTIME-INR - Abnormal; Notable for the following:    Prothrombin Time 31.4 (*)    INR 3.17 (*)    All other components within normal limits  CBC - Abnormal; Notable for the following:    WBC 13.9 (*)    RBC 3.29 (*)    Hemoglobin 9.5 (*)    HCT 29.2 (*)    RDW 17.0 (*)    Platelets 472  (*)    All other components within normal limits  PRO B NATRIURETIC PEPTIDE - Abnormal; Notable for the following:    Pro B Natriuretic peptide (BNP) 15766.0 (*)    All other components within normal limits  COMPREHENSIVE METABOLIC PANEL - Abnormal; Notable for the following:    Sodium 130 (*)    Glucose, Bld 107 (*)    Creatinine, Ser 1.29 (*)    Albumin 1.8 (*)    AST 45 (*)    GFR calc non Af Amer 36 (*)    GFR calc Af Amer 42 (*)    All other components within normal limits  MAGNESIUM - Abnormal; Notable for the following:    Magnesium 1.4 (*)    All other components within normal limits  CBC - Abnormal; Notable for the following:    WBC 13.6 (*)    RBC 3.10 (*)    Hemoglobin 8.8 (*)    HCT 27.4 (*)    RDW 17.3 (*)    Platelets 439 (*)    All other components within normal limits  BASIC METABOLIC PANEL - Abnormal; Notable for the following:    Sodium 130 (*)    Potassium 3.1 (*)    Glucose, Bld 107 (*)    Creatinine, Ser 1.19 (*)    Calcium 8.3 (*)    GFR calc non Af Amer 40 (*)    GFR calc Af Amer 46 (*)    All other components within normal limits  PROTIME-INR - Abnormal; Notable for the following:    Prothrombin Time 32.0 (*)    INR 3.25 (*)    All other components within normal limits  OSMOLALITY, URINE - Abnormal; Notable for the following:    Osmolality, Ur 367 (*)    All other components within normal limits  MAGNESIUM - Abnormal; Notable for the following:    Magnesium 1.3 (*)    All other components within normal limits  MRSA PCR SCREENING  URINALYSIS, ROUTINE W REFLEX MICROSCOPIC  POCT I-STAT TROPONIN I   Imaging Review Dg Chest Portable 1 View  04/10/2013   CLINICAL DATA:  Shortness of breath, weakness, atrial fibrillation, hypertension, breast cancer  EXAM: PORTABLE CHEST - 1 VIEW  COMPARISON:  Portable exam 1644 hr compared to 02/18/2013  FINDINGS: Enlargement of cardiac silhouette.  Atherosclerotic calcification aorta.  Pulmonary vascular  congestion.  Mediastinal contour stable.  Emphysematous and bronchitic changes consistent with COPD.  Minimal diffuse chronic interstitial prominence.  No definite acute infiltrate, pleural effusion or pneumothorax.  Bones appear demineralized.  IMPRESSION: Enlargement of cardiac silhouette with pulmonary vascular congestion.  COPD changes.  No acute abnormalities.   Electronically Signed   By: Ulyses Southward M.D.   On: 04/10/2013 17:18    EKG Interpretation    Date/Time:    Ventricular Rate:    PR Interval:    QRS Duration:   QT Interval:    QTC Calculation:  R Axis:     Text Interpretation:             EKG independently reviewed by myself: Atrial fibrillation with a rate of 139  CRITICAL CARE Performed by: Ross Marcus, F   Total critical care time: 40 min  Critical care time was exclusive of separately billable procedures and treating other patients.  Critical care was necessary to treat or prevent imminent or life-threatening deterioration.  Critical care was time spent personally by me on the following activities: development of treatment plan with patient and/or surrogate as well as nursing, discussions with consultants, evaluation of patient's response to treatment, examination of patient, obtaining history from patient or surrogate, ordering and performing treatments and interventions, ordering and review of laboratory studies, ordering and review of radiographic studies, pulse oximetry and re-evaluation of patient's condition.  MDM   1. Atrial fibrillation with RVR   2. Hypotension             This is an 79 year old female with a history fibrillation who presents with tachycardia and hypotension.  She is mentating on my exam. She appears clinically dry. She has a history of diastolic heart failure. She was given 1 L of normal saline with only marginal improvement of her blood pressure. Patient was given 10 mg of IV diltiazem without improvement of her rate.   Patient was placed on continuous cardiac monitoring.  I discussed the patient with Dr. Jacinto Halim, cardiology who had followed the patient while in the hospital previously. He states that he's not following the patient as an outpatient as she requests to be followed primarily by her primary care physician. She is likely in afib 2/2 to removal of diltiazem PO.  He has recommended starting a diltiazem drip without addition of pressors at this time.  A diltiazem drip was started and patient's blood pressure was monitored very closely. Patient will be admitted to the hospital service for further management.    Shon Baton, MD 04/11/13 [phone]

## 2013-04-10 NOTE — ED Notes (Signed)
Pt c/o of hyptotension and tachycardia. Generalized weakness, states that she is not feeling like herself. Resently discharged.

## 2013-04-10 NOTE — H&P (Addendum)
[facility] History and Physical  Lisa Nixon:[id number] DOB: Jun 27, 1922 DOA: 04/10/2013  Referring physician: EDP PCP: Georgianne Fick, MD Cards: Pamella Pert, MD   Chief Complaint: High heart rate and low BP  HPI: Lisa Nixon is a 79 y.o. female with history of A. fib on Coumadin, severe protein malnutrition, hypertension, iron deficiency anemia, GERD, chronic constipation who presented to [facility] ER was admitted and discharged from [facility] on 11/25 due to Afib with RVR, she was treated with Diltiazem gtt then, she has been having issues related to her HR and soft BP for 1-2 weeks. She was seen by Dr. [name] yesterday, her blood pressure was soft then, diltiazem was discontinued. Today her home health nurse went to check on her and found her heart rate to be in the 140s and blood pressure less than 90 systolic and hence her PCP was called and sent to the ER. Patient denies any specific complaints . Reports ongoing intermittent chest tightness for 2 years. She also reports difficulty breathing off and on for one to 2 weeks but none at this time.  in the ER noted to be in A. fib at the RVR , EDP D/w Dr.Ganji who recommended diltiazem drip. [facility] consulted for further management.     Review of Systems: The patient denies anorexia, fever, weight loss,, vision loss, decreased hearing, hoarseness, chest pain, syncope, dyspnea on exertion, peripheral edema, balance deficits, hemoptysis, abdominal pain, melena, hematochezia, severe indigestion/heartburn, hematuria, incontinence, genital sores, muscle weakness, suspicious skin lesions, transient blindness, difficulty walking, depression, unusual weight change, abnormal bleeding, enlarged lymph nodes, angioedema, and breast masses.    Past Medical History  Diagnosis Date  . Atrial fibrillation   . Hyperlipemia   . GERD (gastroesophageal reflux disease)   . Hypertension   . Breast cancer   . Angina at rest    Past  Surgical History  Procedure Laterality Date  . Breast surgery    . Lymphadenectomy    . Hiatal hernia repair    . Cesarean section     Social History:  reports that she has never smoked. She does not have any smokeless tobacco history on file. She reports that she does not drink alcohol or use illicit drugs.  lives at home with spouse  Allergies  Allergen Reactions  . Penicillins Nausea And Vomiting    "almost pass out"    History reviewed. No pertinent family history.  Prior to Admission medications   Medication Sig Start Date End Date Taking? Authorizing Provider  diltiazem (CARDIZEM CD) 120 MG 24 hr capsule Take 1 capsule (120 mg total) by mouth 2 (two) times daily. 03/27/13  Yes Osvaldo Shipper, MD  ENSURE PLUS (ENSURE PLUS) LIQD Take 237 mLs by mouth 2 (two) times daily between meals.   Yes Historical Provider, MD  Fe Fum-FePoly-Vit C-Vit B3 (INTEGRA PO) Take 1 capsule by mouth daily.   Yes Historical Provider, MD  metoprolol (LOPRESSOR) 50 MG tablet Take 2 tablets (100 mg total) by mouth 2 (two) times daily. 03/27/13  Yes Osvaldo Shipper, MD  mirtazapine (REMERON) 15 MG tablet Take 15 mg by mouth at bedtime.   Yes Historical Provider, MD  oxyCODONE-acetaminophen (PERCOCET) 7.5-325 MG per tablet Take 1 tablet by mouth every 8 (eight) hours as needed for pain. 03/27/13  Yes Osvaldo Shipper, MD  pantoprazole (PROTONIX) 40 MG tablet Take 40 mg by mouth daily.   Yes Historical Provider, MD  polyethylene glycol (MIRALAX / GLYCOLAX) packet Take 17 g by  mouth daily. 03/27/13  Yes Osvaldo Shipper, MD  potassium chloride SA (K-DUR,KLOR-CON) 20 MEQ tablet Take 20 mEq by mouth daily.   Yes Historical Provider, MD  senna-docusate (SENOKOT-S) 8.6-50 MG per tablet Take 2 tablets by mouth 2 (two) times daily. 03/27/13  Yes Osvaldo Shipper, MD  silver sulfADIAZINE (SILVADENE) 1 % cream Apply topically 2 (two) times daily. 02/26/13  Yes Leroy Sea, MD  vitamin B-12 (CYANOCOBALAMIN) 1000 MCG tablet  Take 1,000 mcg by mouth daily.   Yes Historical Provider, MD  warfarin (COUMADIN) 5 MG tablet Take 0.5-1 tablets (2.5-5 mg total) by mouth daily. Skip today's dose then take 5mg  on Mondays and Wednesdays and takes 2.5mg  on all other days close outpatient INR monitoring at nursing home 02/26/13  Yes Leroy Sea, MD   Physical Exam: Filed Vitals:   04/10/13 1900  BP: 93/62  Pulse: 139  Temp:   Resp: 21     General:he is alert awake oriented to self and place only  CVS S1-S2 Irregular rate and rhythm  Lungs: Fine crackles bilaterally  Abdomen soft mild lower quadrant tenderness, no rigidity no rebound, positive bowel sounds no organomegaly  Extremities no edema clubbing or cyanosis  Psychiatric appropriate mood and affect  Skin no rashes or skin breakdown  Neuro moves all extremities no localizing signs  Labs on Admission:  Basic Metabolic Panel:  Recent Labs Lab 04/10/13 1643  NA 130*  K 4.9  CL 96  CO2 22  GLUCOSE 107*  BUN 17  CREATININE 1.29*  CALCIUM 9.0  MG 1.4*   Liver Function Tests:  Recent Labs Lab 04/10/13 1643  AST 45*  ALT 11  ALKPHOS 69  BILITOT 0.6  PROT 6.5  ALBUMIN 1.8*   No results found for this basename: LIPASE, AMYLASE,  in the last 168 hours No results found for this basename: AMMONIA,  in the last 168 hours CBC:  Recent Labs Lab 04/10/13 1643  WBC 13.9*  HGB 9.5*  HCT 29.2*  MCV 88.8  PLT 472*   Cardiac Enzymes: No results found for this basename: CKTOTAL, CKMB, CKMBINDEX, TROPONINI,  in the last 168 hours  BNP (last 3 results)  Recent Labs  02/18/13 0430 04/10/13 1643  PROBNP 16447.0* 15766.0*   CBG: No results found for this basename: GLUCAP,  in the last 168 hours  Radiological Exams on Admission: Dg Chest Portable 1 View  04/10/2013   CLINICAL DATA:  Shortness of breath, weakness, atrial fibrillation, hypertension, breast cancer  EXAM: PORTABLE CHEST - 1 VIEW  COMPARISON:  Portable exam 1644 hr compared  to 02/18/2013  FINDINGS: Enlargement of cardiac silhouette.  Atherosclerotic calcification aorta.  Pulmonary vascular congestion.  Mediastinal contour stable.  Emphysematous and bronchitic changes consistent with COPD.  Minimal diffuse chronic interstitial prominence.  No definite acute infiltrate, pleural effusion or pneumothorax.  Bones appear demineralized.  IMPRESSION: Enlargement of cardiac silhouette with pulmonary vascular congestion.  COPD changes.  No acute abnormalities.   Electronically Signed   By: Ulyses Southward M.D.   On: 04/10/2013 17:18    EKG: Independently reviewed. A. fib with RVR nonspecific T wave changes  Assessment/Plan  1. Atrial fibrillation with RVR -Continue diltiazem drip can take titrate up his blood pressure tolerated up to 15 mg per hour -Continue PO metoprolol - May need to Digoxin load or amiodarone, Blood pressure and limits further therapy -will need Dr.Ganjis input in am -Couamdin per pharmacy  2. acute on chronic diastolic CHF  -Likely due to #  1  -Respiratory status stable, no hypoxemia  -Hold off on diuretics at this time due to blood pressure   3. severe protein calorie malnutrition -Continue supplement as tolerated  4. chronic abdominal pain -2 CT scans in the last 2 months have been relatively unremarkable, chronic constipation was suspected -FU UA -Supportive care for now -If clinically worsening will need repeat imaging -Continue laxatives  5. hyponatremia due to volume overloaded state -Diurese as and when blood pressure tolerates  DVT prophylaxis on therapeutic anticoagulation  Code Status: Full Code Family Communication: called and d/w daughter Leverne Humbles Disposition Plan: admit to SDU  Time spent:  [facility] Pager [phone]  If 7PM-7AM, please contact night-coverage [email or web address] Password [facility] 04/10/2013, 7:38 PM

## 2013-04-10 NOTE — Progress Notes (Signed)
ANTICOAGULATION CONSULT NOTE - Initial Consult  Pharmacy Consult for Warfarin Indication: atrial fibrillation  Allergies  Allergen Reactions  . Penicillins Nausea And Vomiting    "almost pass out"    Patient Measurements:   Heparin Dosing Weight:   Vital Signs: Temp: 97.4 F (36.3 C) (12/09 1609) Temp src: Oral (12/09 1609) BP: 93/62 mmHg (12/09 1900) Pulse Rate: 139 (12/09 1900)  Labs:  Recent Labs  04/10/13 1643  HGB 9.5*  HCT 29.2*  PLT 472*  LABPROT 31.4*  INR 3.17*  CREATININE 1.29*    The CrCl is unknown because both a height and weight (above a minimum accepted value) are required for this calculation.   Medical History: Past Medical History  Diagnosis Date  . Atrial fibrillation   . Hyperlipemia   . GERD (gastroesophageal reflux disease)   . Hypertension   . Breast cancer   . Angina at rest     Medications:  Scheduled:  . diltiazem  10 mg Intravenous Once  . metoprolol  100 mg Oral BID  . mirtazapine  15 mg Oral QHS  . [START ON 04/11/2013] pantoprazole  40 mg Oral Daily  . [START ON 04/11/2013] polyethylene glycol  17 g Oral Daily  . senna-docusate  2 tablet Oral BID   Infusions:  . diltiazem (CARDIZEM) infusion     PRN: ondansetron (ZOFRAN) IV, oxyCODONE, oxyCODONE-acetaminophen  Assessment: 79yo F with hx of A. Fib on Coumadin, PMH- CHF, severe protein calorie malnutrition, HTN, Iron deficient anemia, GERD.  INR= 3.17  Goal of Therapy:  INR 2-3 Monitor platelets by anticoagulation protocol: Yes   Plan:  No Coumadin today as INR is supratherapeutic. Protime for in the morning. Coumadin book/video.  Loletta Specter 04/10/2013,8:19 PM

## 2013-04-11 DIAGNOSIS — E871 Hypo-osmolality and hyponatremia: Secondary | ICD-10-CM | POA: Diagnosis present

## 2013-04-11 DIAGNOSIS — R627 Adult failure to thrive: Secondary | ICD-10-CM | POA: Diagnosis present

## 2013-04-11 DIAGNOSIS — T3442XA Frostbite with tissue necrosis of left arm, initial encounter: Secondary | ICD-10-CM

## 2013-04-11 LAB — BASIC METABOLIC PANEL
BUN: 15 mg/dL (ref 6–23)
CO2: 20 mEq/L (ref 19–32)
Calcium: 8.3 mg/dL — ABNORMAL LOW (ref 8.4–10.5)
Chloride: 98 mEq/L (ref 96–112)
Creatinine, Ser: 1.19 mg/dL — ABNORMAL HIGH (ref 0.50–1.10)
GFR calc Af Amer: 46 mL/min — ABNORMAL LOW (ref >90)
GFR calc non Af Amer: 40 mL/min — ABNORMAL LOW (ref >90)
Glucose, Bld: 107 mg/dL — ABNORMAL HIGH (ref 70–99)
Potassium: 3.1 mEq/L — ABNORMAL LOW (ref 3.5–5.1)
Sodium: 130 mEq/L — ABNORMAL LOW (ref 135–145)

## 2013-04-11 LAB — CBC
HCT: 27.4 % — ABNORMAL LOW (ref 36.0–46.0)
Hemoglobin: 8.8 g/dL — ABNORMAL LOW (ref 12.0–15.0)
MCH: 28.4 pg (ref 26.0–34.0)
MCHC: 32.1 g/dL (ref 30.0–36.0)
MCV: 88.4 fL (ref 78.0–100.0)
Platelets: 439 10*3/uL — ABNORMAL HIGH (ref 150–400)
RBC: 3.1 MIL/uL — ABNORMAL LOW (ref 3.87–5.11)
RDW: 17.3 % — ABNORMAL HIGH (ref 11.5–15.5)
WBC: 13.6 10*3/uL — ABNORMAL HIGH (ref 4.0–10.5)

## 2013-04-11 LAB — PROTIME-INR
INR: 3.25 — ABNORMAL HIGH (ref 0.00–1.49)
Prothrombin Time: 32 seconds — ABNORMAL HIGH (ref 11.6–15.2)

## 2013-04-11 LAB — URINALYSIS, ROUTINE W REFLEX MICROSCOPIC
Glucose, UA: NEGATIVE mg/dL
Leukocytes, UA: NEGATIVE
Protein, ur: NEGATIVE mg/dL
Specific Gravity, Urine: 1.011 (ref 1.005–1.030)
pH: 6 (ref 5.0–8.0)

## 2013-04-11 LAB — MAGNESIUM: Magnesium: 1.3 mg/dL — ABNORMAL LOW (ref 1.5–2.5)

## 2013-04-11 LAB — OSMOLALITY, URINE: Osmolality, Ur: 367 mOsm/kg — ABNORMAL LOW (ref 390–1090)

## 2013-04-11 MED ORDER — POTASSIUM CHLORIDE CRYS ER 20 MEQ PO TBCR
40.0000 meq | EXTENDED_RELEASE_TABLET | Freq: Every day | ORAL | Status: DC
  Administered 2013-04-11 – 2013-04-16 (×6): 40 meq via ORAL
  Filled 2013-04-11 (×6): qty 2

## 2013-04-11 MED ORDER — AMIODARONE LOAD VIA INFUSION
150.0000 mg | Freq: Once | INTRAVENOUS | Status: AC
  Administered 2013-04-11: 150 mg via INTRAVENOUS
  Filled 2013-04-11: qty 83.34

## 2013-04-11 MED ORDER — LACTULOSE 10 GM/15ML PO SOLN
20.0000 g | Freq: Every day | ORAL | Status: DC | PRN
Start: 2013-04-11 — End: 2013-04-14
  Administered 2013-04-11: 20 g via ORAL
  Filled 2013-04-11: qty 30

## 2013-04-11 MED ORDER — DIGOXIN 0.25 MG/ML IJ SOLN
0.1250 mg | Freq: Once | INTRAMUSCULAR | Status: AC
  Administered 2013-04-11: 0.125 mg via INTRAVENOUS
  Filled 2013-04-11 (×2): qty 0.5

## 2013-04-11 MED ORDER — METOPROLOL SUCCINATE ER 100 MG PO TB24
100.0000 mg | ORAL_TABLET | Freq: Every day | ORAL | Status: DC
  Administered 2013-04-11: 100 mg via ORAL
  Filled 2013-04-11: qty 1

## 2013-04-11 MED ORDER — AMIODARONE HCL 200 MG PO TABS
400.0000 mg | ORAL_TABLET | Freq: Three times a day (TID) | ORAL | Status: DC
  Administered 2013-04-11 – 2013-04-16 (×16): 400 mg via ORAL
  Filled 2013-04-11 (×19): qty 2

## 2013-04-11 MED ORDER — ENSURE COMPLETE PO LIQD
237.0000 mL | Freq: Two times a day (BID) | ORAL | Status: DC
  Administered 2013-04-11 – 2013-04-15 (×8): 237 mL via ORAL

## 2013-04-11 MED ORDER — METOPROLOL SUCCINATE ER 50 MG PO TB24
50.0000 mg | ORAL_TABLET | Freq: Two times a day (BID) | ORAL | Status: DC
  Administered 2013-04-11 – 2013-04-16 (×8): 50 mg via ORAL
  Filled 2013-04-11 (×12): qty 1

## 2013-04-11 MED ORDER — AMIODARONE HCL IN DEXTROSE 360-4.14 MG/200ML-% IV SOLN: 30.0000 mg/h | INTRAVENOUS | Status: AC

## 2013-04-11 MED ORDER — DILTIAZEM HCL ER COATED BEADS 240 MG PO CP24
240.0000 mg | ORAL_CAPSULE | Freq: Every day | ORAL | Status: DC
  Administered 2013-04-11 – 2013-04-16 (×5): 240 mg via ORAL
  Filled 2013-04-11 (×7): qty 1

## 2013-04-11 MED ORDER — AMIODARONE HCL IN DEXTROSE 360-4.14 MG/200ML-% IV SOLN
60.0000 mg/h | INTRAVENOUS | Status: AC
  Administered 2013-04-11 (×2): 60 mg/h via INTRAVENOUS
  Filled 2013-04-11 (×2): qty 200

## 2013-04-11 NOTE — Consult Note (Signed)
CARDIOLOGY CONSULT NOTE  Patient ID: SOLACE MANWARREN MRN: [id number] DOB/AGE: May 29, 1922 79 y.o.  Admit date: 04/10/2013 Referring Physician  Pleas Koch, MD Primary Physician:  Pamella Pert, MD Reason for Consultation  A. Fib with RVR.  HPI: Patient is 79 year old black female with past medical history significant for hypertension, history of permanent atrial fibrillation, history of bilateral pulmonary embolism on chronic Coumadin, hypercholesteremia, GERD, degenerative joint disease with spinal stenosis, was brought to the ER by EMS as home health nurse found her to be in rapid ventricular response, no blood pressure and was recommended by the PCP to go to the emergency room. She was started on IV Cardizem and also IV amiodarone and I was consulted to evaluate her for heart rate control.  Patient presently denies any chest pain shortness of breath complains of generalized bodyache and chronic back pain and also frequency of urination. Patient denies any fever or chills.  Except feeling generally weak, fatigued, mild abdominal discomfort and chronic constipation, no other specific complaints. No recent bleeding diathesis, no dark stools, no bloody stools. Tolerating Coumadin without significant problems, she does have chronic and deficiency anemia but has been stable.   Past Medical History  Diagnosis Date  . Atrial fibrillation   . Hyperlipemia   . GERD (gastroesophageal reflux disease)   . Hypertension   . Breast cancer   . Angina at rest      Past Surgical History  Procedure Laterality Date  . Breast surgery    . Lymphadenectomy    . Hiatal hernia repair    . Cesarean section       History reviewed. No pertinent family history.   Social History: History   Social History  . Marital Status: Married    Spouse Name: N/A    Number of Children: N/A  . Years of Education: N/A   Occupational History  . Not on file.   Social History Main Topics  . Smoking status: Never  Smoker   . Smokeless tobacco: Not on file  . Alcohol Use: No  . Drug Use: No  . Sexual Activity: Not on file   Other Topics Concern  . Not on file   Social History Narrative  . No narrative on file     Prescriptions prior to admission  Medication Sig Dispense Refill  . diltiazem (CARDIZEM CD) 120 MG 24 hr capsule Take 1 capsule (120 mg total) by mouth 2 (two) times daily.  60 capsule  2  . ENSURE PLUS (ENSURE PLUS) LIQD Take 237 mLs by mouth 2 (two) times daily between meals.      . Fe Fum-FePoly-Vit C-Vit B3 (INTEGRA PO) Take 1 capsule by mouth daily.      . metoprolol (LOPRESSOR) 50 MG tablet Take 2 tablets (100 mg total) by mouth 2 (two) times daily.  120 tablet  2  . mirtazapine (REMERON) 15 MG tablet Take 15 mg by mouth at bedtime.      Marland Kitchen oxyCODONE-acetaminophen (PERCOCET) 7.5-325 MG per tablet Take 1 tablet by mouth every 8 (eight) hours as needed for pain.  30 tablet  0  . pantoprazole (PROTONIX) 40 MG tablet Take 40 mg by mouth daily.      . polyethylene glycol (MIRALAX / GLYCOLAX) packet Take 17 g by mouth daily.  14 each  0  . potassium chloride SA (K-DUR,KLOR-CON) 20 MEQ tablet Take 20 mEq by mouth daily.      Marland Kitchen senna-docusate (SENOKOT-S) 8.6-50 MG per tablet Take 2 tablets by  mouth 2 (two) times daily.  60 tablet  0  . silver sulfADIAZINE (SILVADENE) 1 % cream Apply topically 2 (two) times daily.  50 g  0  . vitamin B-12 (CYANOCOBALAMIN) 1000 MCG tablet Take 1,000 mcg by mouth daily.      Marland Kitchen warfarin (COUMADIN) 5 MG tablet Take 0.5-1 tablets (2.5-5 mg total) by mouth daily. Skip today's dose then take 5mg  on Mondays and Wednesdays and takes 2.5mg  on all other days close outpatient INR monitoring at nursing home        Scheduled Meds: . amiodarone  400 mg Oral TID  . diltiazem  10 mg Intravenous Once  . feeding supplement (ENSURE COMPLETE)  237 mL Oral BID BM  . metoprolol succinate  100 mg Oral Daily  . mirtazapine  15 mg Oral QHS  . pantoprazole  40 mg Oral Daily  .  polyethylene glycol  17 g Oral Daily  . potassium chloride  40 mEq Oral Daily  . senna-docusate  2 tablet Oral BID   Continuous Infusions: . diltiazem (CARDIZEM) infusion Stopped (04/11/13 1829)   PRN Meds:.lactulose, ondansetron (ZOFRAN) IV, oxyCODONE, oxyCODONE-acetaminophen  ROS: General: no fevers/chills/night sweats Eyes: no blurry vision, diplopia, or amaurosis ENT: no sore throat or hearing loss Resp: no cough, wheezing, or hemoptysis CV: no edema or palpitations Skin: The small ulceration in the left forearm IV site from the main extravasation still present. Neuro: no headache, numbness, tingling, or weakness of extremities Musculoskeletal: no joint pain or swelling Heme: no bleeding, DVT, or easy bruising Endo: no polydipsia or polyuria    Physical Exam: Blood pressure 113/74, pulse 138, temperature 97.3 F (36.3 C), temperature source Oral, resp. rate 14, height 5\' 4"  (1.626 m), weight 52.1 kg (114 lb 13.8 oz), SpO2 92.00%.  General appearance: alert, cooperative, appears stated age and no distress, petite and frail.  Eyes: negative findings: lids and lashes normal  Neck: no adenopathy, no carotid bruit, no JVD, supple, symmetrical, trachea midline and thyroid not enlarged, symmetric, no tenderness/mass/nodules  Neck: JVP - normal, carotids 2+= without bruits  Resp: clear to auscultation bilaterally  Chest wall: no tenderness  Cardio: S1 is variable, S2 is normal as no gallop or murmur.  GI: soft, mild diffuse tenderness present; bowel sounds normal; no masses, no organomegaly  Extremities: Trace ankle edema present. Full range of movements.  Labs:   Lab Results  Component Value Date   WBC 13.6* 04/11/2013   HGB 8.8* 04/11/2013   HCT 27.4* 04/11/2013   MCV 88.4 04/11/2013   PLT 439* 04/11/2013    Recent Labs Lab 04/10/13 1643 04/11/13 0730  NA 130* 130*  K 4.9 3.1*  CL 96 98  CO2 22 20  BUN 17 15  CREATININE 1.29* 1.19*  CALCIUM 9.0 8.3*  PROT 6.5  --    BILITOT 0.6  --   ALKPHOS 69  --   ALT 11  --   AST 45*  --   GLUCOSE 107* 107*   Lab Results  Component Value Date   CKTOTAL 49 02/19/2013   TROPONINI <0.30 02/19/2013    Lipid Panel     Component Value Date/Time   CHOL 126 02/19/2013 0500   TRIG 70 02/19/2013 0500   HDL 46 02/19/2013 0500   CHOLHDL 2.7 02/19/2013 0500   VLDL 14 02/19/2013 0500   LDLCALC 66 02/19/2013 0500    EKG: 04/10/2013: Atrial fibrillation with rapid ventricular response, nonspecific T wave abnormalities in anterior leads, no significant change from prior  EKG.   Scheduled Meds: . amiodarone  400 mg Oral TID  . diltiazem  10 mg Intravenous Once  . feeding supplement (ENSURE COMPLETE)  237 mL Oral BID BM  . metoprolol succinate  100 mg Oral Daily  . mirtazapine  15 mg Oral QHS  . pantoprazole  40 mg Oral Daily  . polyethylene glycol  17 g Oral Daily  . potassium chloride  40 mEq Oral Daily  . senna-docusate  2 tablet Oral BID   Continuous Infusions: . diltiazem (CARDIZEM) infusion Stopped (04/11/13 1829)   PRN Meds:.lactulose, ondansetron (ZOFRAN) IV, oxyCODONE, oxyCODONE-acetaminophen  Radiology: Dg Chest Portable 1 View  04/10/2013   CLINICAL DATA:  Shortness of breath, weakness, atrial fibrillation, hypertension, breast cancer  EXAM: PORTABLE CHEST - 1 VIEW  COMPARISON:  Portable exam 1644 hr compared to 02/18/2013  FINDINGS: Enlargement of cardiac silhouette.  Atherosclerotic calcification aorta.  Pulmonary vascular congestion.  Mediastinal contour stable.  Emphysematous and bronchitic changes consistent with COPD.  Minimal diffuse chronic interstitial prominence.  No definite acute infiltrate, pleural effusion or pneumothorax.  Bones appear demineralized.  IMPRESSION: Enlargement of cardiac silhouette with pulmonary vascular congestion.  COPD changes.  No acute abnormalities.   Electronically Signed   By: Ulyses Southward M.D.   On: 04/10/2013 17:18   Echocardiogram 02/19/13: Left ventricle: The  cavity size was normal. Wall thickness was increased in a pattern of mild LVH. Systolic function was normal. The estimated ejection fraction was in the range of 60% to 65%. The study is not technically sufficient to allow evaluation of LV diastolic function. Left atrium: The atrium was severely dilated. Mild pulmonary hhypertension  Pulmonary arteries: PA peak pressure: 34mm Hg (S).   ASSESSMENT AND PLAN:  1. Atrial fibrillation with rapid ventricular response, patient presently much better heart rate controlled on IV Cardizem, patient now transitioned to by mouth amiodarone and Toprol. 2. Severe anemia and hypoproteinemia and malnutrition 3. Acute on chronic renal insufficiency, now resolved with hydration. 4. Subcutaneous dopamine infiltrate leading to chemical-induced skin necrosis left antecubital fossa that occurred on prior admission in September of 2014. 5. Chronic leukocytosis and thrombocytosis, in terms of anemia may suggest myelodysplastic syndrome.  Recommendation: Patient has chronic constipation which is not new, she's now complaining of mild abdominal discomfort, this is also old. Her heart rate is much better controlled presently. I will give her IV fluids, overnight for increasing fluid volume status, change her to by mouth Cardizem, and reevaluate her heart rate response. If her heart rate is controlled, she can potentially be discharged in the morning on a amiodarone 200 mg by mouth twice a day, and Toprol and Cardizem. She is close followup, patient initially was reluctant to followup on outpatient basis, but they're agreeable to follow up in a close manner.  Pamella Pert, MD 04/11/2013, 6:30 PM [facility] Pager: [phone] Office: [phone] If no answer Cell [phone]

## 2013-04-11 NOTE — Progress Notes (Signed)
Assisted pt from chair to bed for bowel movement. Pt wanted to go to bed vs. Bsc. Pt was having large amounts of watery stool. It was noticed while pt was in bed and on her side that she impacted as told in report. Attempted to disimpact as much as possible, but pt was experiencing a lot of pain and her heart rate was in the 130s. Stopped impacting and cleaned pt. Currently stable and will continue to monitor.

## 2013-04-11 NOTE — Progress Notes (Addendum)
[facility]   Lisa Nixon:[id number] DOB: 08-16-1922 DOA: 04/10/2013 PCP: Pamella Pert, MD  Brief narrative:  Patient states that she was doing fairly until being weak and passing out at 03:00 am. No specific C/o issues with CP, Mild SOB noted recently and she needs to "wait for a while" after sitting up at bedsdie She hasn't really had much mobility since Last few Hospital admissions Has trouble going to RR as she doesn;t eat. She has lost a lot of weight over the last few months-Daughter has been trying to give some Constiption meds-she doesn't eat enough o warrant enough stool per daughter Daughter confirms that she has been taking the medications as directed by her 1ry care Md.  Was also taken off of coumadian by Dr. Judie Grieve.   She hasn't seen her Cardiologist Dr. Jacinto Halim since Hospital admission. Daughter doesn't live with her and she does get a home health aide who helps out since recently  Past medical history-As per Problem list Chart reviewed as below- Extensive-see below  Consultants:  Dr. Jacinto Halim Cardiology  Procedures:   None  Antibiotics:  none   Subjective  Feels fair.  N0t compeltely oriented.  States she has been incontinent of urine Daughter reports that she is under the care of patient's son has power of attorney She currently lives with her husband who also is in frail health Patient cannot remember the year or recent Thanksgiving holiday, but can tell me that this is 2014 and that the president Is Obama   Objective    Interim History: none  Telemetry: Afib ranging between 90-120  Objective: Filed Vitals:   04/11/13 0500 04/11/13 0600 04/11/13 0700 04/11/13 0800  BP: 106/82 114/80 82/50 99/59   Pulse: 134 134 126 138  Temp:      TempSrc:      Resp: 22 17 18 24   Height:      Weight:      SpO2: 97% 97% 97% 97%    Intake/Output Summary (Last 24 hours) at 04/11/13 9562 Last data filed at 04/11/13 0600  Gross per 24 hour  Intake 619.49 ml    Output      0 ml  Net 619.49 ml    Exam:  General: Alert frail temporal wasting Cardiovascular: JVD 6 centimeters, no bruit, S1-S2? S3 left lower sternal edge Respiratory: Clinically clear no crackles Abdomen: Soft nontender nondistended surgical scars scaphoid abdomen noted, stool in diaper Skin Noted area of infiltration of left arm as above Neuro grossly intact moving all 4 extremities equally  Data Reviewed: Basic Metabolic Panel:  Recent Labs Lab 04/10/13 1643  NA 130*  K 4.9  CL 96  CO2 22  GLUCOSE 107*  BUN 17  CREATININE 1.29*  CALCIUM 9.0  MG 1.4*   Liver Function Tests:  Recent Labs Lab 04/10/13 1643  AST 45*  ALT 11  ALKPHOS 69  BILITOT 0.6  PROT 6.5  ALBUMIN 1.8*   No results found for this basename: LIPASE, AMYLASE,  in the last 168 hours No results found for this basename: AMMONIA,  in the last 168 hours CBC:  Recent Labs Lab 04/10/13 1643 04/11/13 0730  WBC 13.9* 13.6*  HGB 9.5* 8.8*  HCT 29.2* 27.4*  MCV 88.8 88.4  PLT 472* 439*   Cardiac Enzymes: No results found for this basename: CKTOTAL, CKMB, CKMBINDEX, TROPONINI,  in the last 168 hours BNP: No components found with this basename: POCBNP,  CBG: No results found for this basename: GLUCAP,  in the  last 168 hours  Recent Results (from the past 240 hour(s))  MRSA PCR SCREENING     Status: None   Collection Time    04/10/13  8:18 PM      Result Value Range Status   MRSA by PCR NEGATIVE  NEGATIVE Final   Comment:            The GeneXpert MRSA Assay (FDA     approved for NASAL specimens     only), is one component of a     comprehensive MRSA colonization     surveillance program. It is not     intended to diagnose MRSA     infection nor to guide or     monitor treatment for     MRSA infections.     Studies:              All Imaging reviewed and is as per above notation   Scheduled Meds: . diltiazem  10 mg Intravenous Once  . metoprolol  100 mg Oral BID  .  mirtazapine  15 mg Oral QHS  . pantoprazole  40 mg Oral Daily  . polyethylene glycol  17 g Oral Daily  . senna-docusate  2 tablet Oral BID   Continuous Infusions: . amiodarone (NEXTERONE PREMIX) 360 mg/200 mL dextrose 60 mg/hr (04/11/13 0610)   Followed by  . amiodarone (NEXTERONE PREMIX) 360 mg/200 mL dextrose    . diltiazem (CARDIZEM) infusion 15 mg/hr (04/11/13 0118)     Assessment/Plan: Patient Active Problem List   Diagnosis Date Noted  . Hyposmolality and/or hyponatremia-get urine osmolality and urine sodium .  Suspect given history of poor diet, this is likely secondary to euvolemic hyponatremia with salt wasting  04/11/2013  . Failure to thrive syndrome, adult, likely secondary to multiple debilitating illnesses 04/11/2013  .  tissue necrosis of left arm-likely secondary to arm infiltration.  Will get PICC line if needed however would hold off unless we don't have any other option as she may transition quickly  04/11/2013  . Constipation, chronic-trial disimpaction once again , suspect secondary to poor diet  03/23/2013  . Atrial fibrillation with RVR-unclear how complaint patient is on medical therapy-long discussion with Dr. Jacinto Halim , cardiologist 04/11/13 who recommends to transition the patient from amiodarone drip to 400 mg amiodarone 3 times a day first dose 9 AM . The patient still on Cardizem drip at 15 milligrams per hour, received one dose of digoxin 0.125 at 2 AM and is currently on metoprolol 100 twice a day  Cardiology will see the patient in consult and will coordinate discharge medications and help optimize her atrial fibrillation care-I. suspect that she is at high risk for falls and debility given her overall failure to thrive in am not sure what candidate for Coumadin she is , her CHad2Vasc2 score~4 which = 4% risk of embolic CVA.  Will ask cardiology to comment on the same  02/22/2013  . History of pulmonary embolism-needs further discussion  with family about  Coumadin and inherent risks  02/22/2013  . Myalgia and myositis 02/22/2013  . Leukocytosis, unspecified-unclear etiology-outpatient workup  02/22/2013  . Thrombocytosis-likely secondary and chronic since 2000 [name]  02/22/2013  . Anemia-chronic , borderline microcytic and likely secondary to anemia chronic disease given presenting picture repeat labs in  02/22/2013  . Diabetes-likely diet-controlled . Would not control aggressively at this age her stage  02/22/2013  . Protein-calorie malnutrition, severe-dietitian consult as outpatient  02/21/2013     Code  Status: Confirmed full code with family  Family Communication: 20 minute discussion with husband /daughter on telephone , phone number [phone]  Disposition Plan: Step down for now pending cardiology input, appreciate assistance   Critical care Time spent=60 min, including 50% face-to-face time and discussion with consultants and family Patient at high risk for decompensation and further severe multiorgan illness Needs stepped-down level care at least for the next day  Pleas Koch, MD  [facility] Pager [phone] 04/11/2013, 8:07 AM    LOS: 1 day

## 2013-04-11 NOTE — Progress Notes (Signed)
ANTICOAGULATION CONSULT NOTE - Follow Up Consult  Pharmacy Consult for Warfarin Indication: atrial fibrillation  Allergies  Allergen Reactions  . Penicillins Nausea And Vomiting    "almost pass out"    Patient Measurements: Height: 5\' 4"  (162.6 cm) Weight: 114 lb 13.8 oz (52.1 kg) IBW/kg (Calculated) : 54.7  Vital Signs: Temp: 97.5 F (36.4 C) (12/10 0400) Temp src: Oral (12/10 0400) BP: 99/59 mmHg (12/10 0800) Pulse Rate: 138 (12/10 0800)  Labs:  Recent Labs  04/10/13 1643 04/11/13 0730  HGB 9.5* 8.8*  HCT 29.2* 27.4*  PLT 472* 439*  LABPROT 31.4* 32.0*  INR 3.17* 3.25*  CREATININE 1.29*  --     Estimated Creatinine Clearance: 24.8 ml/min (by C-G formula based on Cr of 1.29).   Medications:  Scheduled:  . diltiazem  10 mg Intravenous Once  . metoprolol  100 mg Oral BID  . mirtazapine  15 mg Oral QHS  . pantoprazole  40 mg Oral Daily  . polyethylene glycol  17 g Oral Daily  . senna-docusate  2 tablet Oral BID   Infusions:  . amiodarone (NEXTERONE PREMIX) 360 mg/200 mL dextrose 60 mg/hr (04/11/13 0610)   Followed by  . amiodarone (NEXTERONE PREMIX) 360 mg/200 mL dextrose    . diltiazem (CARDIZEM) infusion 15 mg/hr (04/11/13 0118)    Assessment: 60 yoF admitted 12/9 with abdominal pain, constipation, Afib with RVR on chronic warfarin therapy, anemia, malnutrition.  INR on admission is supra-therapeutic at 3.17.  Pharmacy is consulted to continue warfarin dose.  PTA warfarin: 2.5mg  daily except 5mg  on Monday and Wednesdays.  Last dose taken on 12/7 (dose held on 12/8 for INR 4.7)  INR 3.25, remains supra-therapeutic  Diet: PO heart diet, but pt has poor intake per notes with significant weight loss prior to admission.  She also c/o significant constipation.  Note drug-drug interaction with Amiodarone (initially started IV on 12/10, then changed to PO).  Amiodarone may increase the serum concentration of warfarin.  Consider empiric dose reduction of  warfarin.  CBC:  Hgb decreased from 9.5 to 8.8, Plt remain stable.  Goal of Therapy:  INR 2-3   Plan:   Hold warfarin for supratherapeutic INR.  Pharmacy to f/u INR daily.  Pharmacy to resume warfarin dosing when INR returns to goal range.  Lynann Beaver PharmD, BCPS Pager [phone] 04/11/2013 9:53 AM

## 2013-04-11 NOTE — Progress Notes (Signed)
INITIAL NUTRITION ASSESSMENT  DOCUMENTATION CODES Per approved criteria  -Not Applicable   INTERVENTION: Recommend liberalizing diet to Regular Provide Ensure Complete BID in between meals Provide Snacks BID Provide Magic Cup ice cream once daily  NUTRITION DIAGNOSIS: Inadequate oral intake related to poor appetite as evidenced by pt's report and PO <25% of breakfast.   Goal: Pt to meet >/= 90% of their estimated nutrition needs   Monitor:  PO intake Weight Labs  Reason for Assessment: Malnutrition Screening Tool, score of 5  79 y.o. female  Admitting Dx: Atrial fibrillation with RVR  ASSESSMENT: 79 y.o. female with history of A. fib on Coumadin, severe protein malnutrition, hypertension, iron deficiency anemia, GERD, chronic constipation. On 12/9 her home health nurse went to check on her and found her heart rate to be in the 140s and blood pressure less than 90 systolic and hence her PCP was called and sent to the ER.  Pt states she has a poor appetite which has been normal for her; she reports eating a few bites of grits for breakfast this morning. She reports drinking Ensure supplements twice daily at home. Weight history shows that pt has gained 14 lbs in the past 3 weeks; pt denies swelling and denies eating more recently. Encouraged continued use of Ensure supplements and encouraged snacking to help maintain weight. Pt interested in eating lunch but, requesting food items that are not allowed on Heart Healthy diet.  During previous admission pt reported weighing 180 lbs one year ago.   Height: Ht Readings from Last 1 Encounters:  04/10/13 5\' 4"  (1.626 m)    Weight: Wt Readings from Last 1 Encounters:  04/11/13 114 lb 13.8 oz (52.1 kg)    Ideal Body Weight: 120 lbs  % Ideal Body Weight: 95%  Wt Readings from Last 10 Encounters:  04/11/13 114 lb 13.8 oz (52.1 kg)  03/23/13 100 lb 14.4 oz (45.768 kg)  02/22/13 113 lb 5.1 oz (51.4 kg)    Usual Body Weight:  113 lbs  % Usual Body Weight: 100%  BMI:  Body mass index is 19.71 kg/(m^2).  Estimated Nutritional Needs: Kcal: 1300-1460 Protein: 60-70 grams Fluid: 1.3-1.5 L/day  Skin: +1 LUE edema; black scab on left arm  Diet Order: Cardiac  EDUCATION NEEDS: -No education needs identified at this time   Intake/Output Summary (Last 24 hours) at 04/11/13 1237 Last data filed at 04/11/13 1200  Gross per 24 hour  Intake 1049.39 ml  Output    190 ml  Net 859.39 ml    Last BM: 12/10   Labs:   Recent Labs Lab 04/10/13 1643 04/11/13 0727 04/11/13 0730  NA 130*  --  130*  K 4.9  --  3.1*  CL 96  --  98  CO2 22  --  20  BUN 17  --  15  CREATININE 1.29*  --  1.19*  CALCIUM 9.0  --  8.3*  MG 1.4* 1.3*  --   GLUCOSE 107*  --  107*    CBG (last 3)  No results found for this basename: GLUCAP,  in the last 72 hours  Scheduled Meds: . amiodarone  400 mg Oral TID  . diltiazem  10 mg Intravenous Once  . metoprolol succinate  100 mg Oral Daily  . mirtazapine  15 mg Oral QHS  . pantoprazole  40 mg Oral Daily  . polyethylene glycol  17 g Oral Daily  . potassium chloride  40 mEq Oral Daily  . senna-docusate  2 tablet Oral BID    Continuous Infusions: . diltiazem (CARDIZEM) infusion 15 mg/hr (04/11/13 0118)    Past Medical History  Diagnosis Date  . Atrial fibrillation   . Hyperlipemia   . GERD (gastroesophageal reflux disease)   . Hypertension   . Breast cancer   . Angina at rest     Past Surgical History  Procedure Laterality Date  . Breast surgery    . Lymphadenectomy    . Hiatal hernia repair    . Cesarean section      Ian Malkin RD, LDN Inpatient Clinical Dietitian Pager: [phone] After Hours Pager: [phone]

## 2013-04-11 NOTE — Progress Notes (Signed)
Speaking to pts daughter, learned that son , Lisa Nixon, is her POA. She did not know if he was the healthcare POA or not. Called the son and he did not know if he was the healthcare POA or not. Asked pts son if he would mind checking and if so bring a copy to the floor when he visits next. Contact information below:  Lisa Nixon Cell- [phone] Home- [phone]

## 2013-04-11 NOTE — Progress Notes (Signed)
CARE MANAGEMENT NOTE 04/11/2013  Patient:  Lisa Nixon, Lisa Nixon   Account Number:  [id number]  Date Initiated:  04/11/2013  Documentation initiated by:  Victoria Euceda  Subjective/Objective Assessment:   pt with hx of a.fib readmitted for uncontrolled a.fib with rvr and syncope.     Action/Plan:   active with [facility] lives with daughter may need snf placement   Anticipated DC Date:  04/14/2013   Anticipated DC Plan:  HOME W HOME HEALTH SERVICES  In-house referral  NA      DC Planning Services  NA      [facility] Choice  HOME HEALTH   Choice offered to / List presented to:  NA   DME arranged  NA      DME agency  NA     HH arranged  NA      HH agency  [facility]   Status of service:  In process, will continue to follow Medicare Important Message given?  NA - LOS <3 / Initial given by admissions (If response is "NO", the following Medicare IM given date fields will be blank) Date Medicare IM given:   Date Additional Medicare IM given:    Discharge Disposition:    Per UR Regulation:  Reviewed for med. necessity/level of care/duration of stay  If discussed at Long Length of Stay Meetings, dates discussed:    Comments:  [id number]/Umeka Wrench Stark Jock, BSN, [place] [phone] Chart Reviewed for discharge and hospital needs. Discharge needs at time of review:  None present will follow for needs. Review of patient progress due on [id number].

## 2013-04-12 LAB — COMPREHENSIVE METABOLIC PANEL
Albumin: 1.5 g/dL — ABNORMAL LOW (ref 3.5–5.2)
Alkaline Phosphatase: 75 U/L (ref 39–117)
BUN: 12 mg/dL (ref 6–23)
Calcium: 8.4 mg/dL (ref 8.4–10.5)
Chloride: 100 mEq/L (ref 96–112)
Creatinine, Ser: 0.91 mg/dL (ref 0.50–1.10)
GFR calc Af Amer: 63 mL/min — ABNORMAL LOW (ref >90)
Glucose, Bld: 104 mg/dL — ABNORMAL HIGH (ref 70–99)
Potassium: 3.8 mEq/L (ref 3.5–5.1)
Total Bilirubin: 0.3 mg/dL (ref 0.3–1.2)
Total Protein: 5.7 g/dL — ABNORMAL LOW (ref 6.0–8.3)

## 2013-04-12 LAB — CBC
Hemoglobin: 8.8 g/dL — ABNORMAL LOW (ref 12.0–15.0)
MCV: 88.7 fL (ref 78.0–100.0)
Platelets: 413 10*3/uL — ABNORMAL HIGH (ref 150–400)
RDW: 17.3 % — ABNORMAL HIGH (ref 11.5–15.5)
WBC: 17.1 10*3/uL — ABNORMAL HIGH (ref 4.0–10.5)

## 2013-04-12 LAB — PROTIME-INR: INR: 3.08 — ABNORMAL HIGH (ref 0.00–1.49)

## 2013-04-12 MED ORDER — DEXTROSE-NACL 5-0.45 % IV SOLN
INTRAVENOUS | Status: DC
  Administered 2013-04-12 – 2013-04-14 (×3): via INTRAVENOUS
  Administered 2013-04-14: 75 mL via INTRAVENOUS
  Administered 2013-04-15 – 2013-04-16 (×3): via INTRAVENOUS

## 2013-04-12 MED ORDER — DEXTROSE 5 % AND 0.45 % NACL IV BOLUS
250.0000 mL | Freq: Once | INTRAVENOUS | Status: AC
  Administered 2013-04-12: 250 mL via INTRAVENOUS

## 2013-04-12 MED ORDER — SODIUM CHLORIDE 0.9 % IV SOLN
INTRAVENOUS | Status: DC
  Administered 2013-04-12: 08:00:00 via INTRAVENOUS

## 2013-04-12 NOTE — Progress Notes (Addendum)
ANTICOAGULATION CONSULT NOTE - Follow Up Consult  Pharmacy Consult for Warfarin Indication: atrial fibrillation  Allergies  Allergen Reactions  . Penicillins Nausea And Vomiting    "almost pass out"    Patient Measurements: Height: 5\' 4"  (162.6 cm) Weight: 112 lb 7 oz (51 kg) IBW/kg (Calculated) : 54.7  Vital Signs: Temp: 97.5 F (36.4 C) (12/11 0400) Temp src: Oral (12/11 0400) BP: 92/64 mmHg (12/11 0800) Pulse Rate: 139 (12/11 0800)  Labs:  Recent Labs  04/10/13 1643 04/11/13 0730 04/12/13 0348  HGB 9.5* 8.8* 8.8*  HCT 29.2* 27.4* 27.4*  PLT 472* 439* 413*  LABPROT 31.4* 32.0* 30.7*  INR 3.17* 3.25* 3.08*  CREATININE 1.29* 1.19* 0.91    Estimated Creatinine Clearance: 34.4 ml/min (by C-G formula based on Cr of 0.91).   Medications:  Scheduled:  . amiodarone  400 mg Oral TID  . diltiazem  240 mg Oral Daily  . feeding supplement (ENSURE COMPLETE)  237 mL Oral BID BM  . metoprolol succinate  50 mg Oral BID  . mirtazapine  15 mg Oral QHS  . pantoprazole  40 mg Oral Daily  . polyethylene glycol  17 g Oral Daily  . potassium chloride  40 mEq Oral Daily  . senna-docusate  2 tablet Oral BID   Infusions:     Assessment: 8 yoF admitted 12/9 with abdominal pain, constipation, Afib with RVR on chronic warfarin therapy, anemia, malnutrition.  INR on admission is supra-therapeutic at 3.17.  Pharmacy is consulted to continue warfarin dose.  PTA warfarin: 2.5mg  daily except 5mg  on Monday and Wednesdays.  Last dose taken on 12/7 (dose held on 12/8 for INR 4.7)  INR 3.08, remains supra-therapeutic  Diet: PO heart diet, but pt has poor intake per notes with significant weight loss prior to admission.  She also c/o significant constipation.  Note drug-drug interaction with Amiodarone (initially started IV on 12/10, then changed to PO).  Amiodarone may increase the serum concentration of warfarin.  Consider empiric dose reduction of warfarin when dosing is  resumed.  CBC:  Hgb remains low/stable at 8.8, Plt remain stable.  Goal of Therapy:  INR 2-3   Plan:   Hold warfarin for supratherapeutic INR.  Pharmacy to f/u INR daily.  Pharmacy to resume warfarin dosing when INR returns to goal range.  If discharged, would recommend resuming lower dose of warfarin 2.5mg  daily (omit twice weekly 5mg  doses) and recheck INR in 2-3 days due to significant drug-drug interaction.  Lynann Beaver PharmD, BCPS Pager [phone] 04/12/2013 8:59 AM

## 2013-04-12 NOTE — Progress Notes (Signed)
Clinical Social Work Department CLINICAL SOCIAL WORK PLACEMENT NOTE 04/12/2013  Patient:  Lisa Nixon, Lisa Nixon  Account Number:  [id number] Admit date:  04/10/2013  Clinical Social Worker:  Jacelyn Grip  Date/time:  04/12/2013 02:58 PM  Clinical Social Work is seeking post-discharge placement for this patient at the following level of care:   SKILLED NURSING   (*CSW will update this form in Epic as items are completed)   04/12/2013  Patient/family provided with [facility] Department of Clinical Social Work's list of facilities offering this level of care within the geographic area requested by the patient (or if unable, by the patient's family).  04/12/2013  Patient/family informed of their freedom to choose among providers that offer the needed level of care, that participate in Medicare, Medicaid or managed care program needed by the patient, have an available bed and are willing to accept the patient.  04/12/2013  Patient/family informed of [facility]' ownership interest in [facility], as well as of the fact that they are under no obligation to receive care at this facility.  PASARR submitted to EDS on 04/12/2013 PASARR number received from EDS on 04/12/2013  FL2 transmitted to all facilities in geographic area requested by pt/family on  04/12/2013 FL2 transmitted to all facilities within larger geographic area on   Patient informed that his/her managed care company has contracts with or will negotiate with  certain facilities, including the following:     Patient/family informed of bed offers received:   Patient chooses bed at  Physician recommends and patient chooses bed at    Patient to be transferred to  on   Patient to be transferred to facility by   The following physician request were entered in Epic:   Additional Comments:    Jacklynn Lewis, MSW, LCSWA  Clinical Social Work [phone]

## 2013-04-12 NOTE — Progress Notes (Signed)
Patient hypotensive with low urine output.  MD notified, new orders received.  Will continue to monitor.

## 2013-04-12 NOTE — Progress Notes (Signed)
Patient has been unable to ambulate this shift.  Patient stands with 2 person assist and walker but only manages to take one or two steps before needing to sit back down.  Patient is extremely weak.  Will continue to monitor.

## 2013-04-12 NOTE — Clinical Documentation Improvement (Signed)
THIS DOCUMENT IS NOT A PERMANENT PART OF THE MEDICAL RECORD  Please update your documentation with the medical record to reflect your response to this query. If you need help knowing how to do this please call [phone].  04/12/13   Dear Dr.[facility],  In a better effort to capture your patient's severity of illness, reflect appropriate length of stay and utilization of resources, a review of the patient medical record has revealed the following indicators.    Based on your clinical judgment, please clarify and document in a progress note and/or discharge summary the clinical condition associated with the following supporting information:  In responding to this query please exercise your independent judgment.  The fact that a query is asked, does not imply that any particular answer is desired or expected.  Noted 04/12/13 progr note..."3. Acute on chronic renal insufficiency, now resolved with hydration"... Labs:  04/10/13: BUN 17; Creat 1.19: GFR AA 42;  04/11/13: BUN 15; Creat 1.19; 04/12/13: BUN 12; Creat 0.91; GFR AA 55 TX: IV Hydration, Monitor daily CMP  For accurate Dx specificity & severity can noted "Acute on chronic renal insufficiency" be further claried w/ cond being mon'd, eval'd, and tx'd. Thank you  Possible Clinical Conditions?  CKD Stage I -  GFR > OR = 90 CKD Stage II - GFR 60-80 CKD Stage III - GFR 30-59 CKD Stage IV - GFR 15-29 CKD Stage V - GFR < 15 ESRD (End Stage Renal Disease) Other condition Cannot Clinically determine   Supporting Information: Risk Factors: See above note Signs & Symptoms: See above note Diagnostics:See above note  Treatment:See above note  You may use possible, probable, or suspect with inpatient documentation. possible, probable, suspected diagnoses MUST be documented at the time of discharge  Reviewed:  no additional documentation provided: 04/13/13> pt reassigned to [facility] grp> this clarific closed and re-issued as bpa  for trh today. orm  Thank You,  Toribio Harbour, RN, BSN, CCDS Certified Clinical Documentation Specialist Pager: [phone] Health Information Management [place]

## 2013-04-12 NOTE — Progress Notes (Signed)
Patient seen and examined this morning Tolerating diet fairly Heart rate is still ranging between 110 and 1:30, Has large stool this morning but was nonambulatory She has no lower extremity edema and has no rails and nursing reports that she's had decreased urine output and is eating very little I have discussed the patient's further care with Dr. Delrae Rend As the patient is very well known to him, I will relinquish her to his care and appreciate his assistance in this matter  Pleas Koch, MD [facility] [phone]

## 2013-04-12 NOTE — Progress Notes (Signed)
Subjective:  Feels better. Had a large bowel movement. No specific complaints.  Objective:  Vital Signs in the last 24 hours: Temp:  [97.3 F (36.3 C)-98 F (36.7 C)] 97.5 F (36.4 C) (12/11 0400) Pulse Rate:  [39-139] 139 (12/11 0800) Resp:  [13-20] 16 (12/11 0800) BP: (80-120)/(44-85) 92/64 mmHg (12/11 0800) SpO2:  [92 %-100 %] 100 % (12/11 0800) Weight:  [51 kg (112 lb 7 oz)] 51 kg (112 lb 7 oz) (12/11 0400)  Intake/Output from previous day: 12/10 0701 - 12/11 0700 In: 591.6 [P.O.:360; I.V.:231.6] Out: 393 [Urine:393]  Physical Exam: General appearance: alert, cooperative, appears stated age and no distress, petite and frail.  Eyes: negative findings: lids and lashes normal  Neck: no adenopathy, no carotid bruit, no JVD, supple, symmetrical, trachea midline and thyroid not enlarged, symmetric, no tenderness/mass/nodules  Neck: JVP - normal, carotids 2+= without bruits  Resp: clear to auscultation bilaterally  Chest wall: no tenderness  Cardio: S1 is variable, S2 is normal as no gallop or murmur.  GI: soft, non-tender, bowel sounds normal; no masses, no organomegaly  Extremities: Trace ankle edema present. Full range of movements.   Lab Results:  Recent Labs  04/11/13 0730 04/12/13 0348  WBC 13.6* 17.1*  HGB 8.8* 8.8*  PLT 439* 413*    Recent Labs  04/11/13 0730 04/12/13 0348  NA 130* 130*  K 3.1* 3.8  CL 98 100  CO2 20 20  GLUCOSE 107* 104*  BUN 15 12  CREATININE 1.19* 0.91   No results found for this basename: TROPONINI, CK, MB,  in the last 72 hours Hepatic Function Panel  Recent Labs  04/12/13 0348  PROT 5.7*  ALBUMIN 1.5*  AST 36  ALT 8  ALKPHOS 75  BILITOT 0.3   Lipid Panel     Component Value Date/Time   CHOL 126 02/19/2013 0500   TRIG 70 02/19/2013 0500   HDL 46 02/19/2013 0500   CHOLHDL 2.7 02/19/2013 0500   VLDL 14 02/19/2013 0500   LDLCALC 66 02/19/2013 0500   Telemetry: Atrial fibrillation with RVR, heart rate much better  improved and averaging around 110 beats per minute.  Assessment/Plan:  1. Atrial fibrillation with rapid ventricular response, patient presently much better heart rate controlled on IV Cardizem, patient now transitioned to by mouth amiodarone and Toprol.  2. Severe anemia and hypoproteinemia and malnutrition  3. Acute on chronic renal insufficiency, now resolved with hydration.  4. Subcutaneous dopamine infiltrate leading to chemical-induced skin necrosis left antecubital fossa that occurred on prior admission in September of 2014.  5. Chronic leukocytosis and thrombocytosis, in terms of anemia may suggest myelodysplastic syndrome. 6. Acute on chronic diastolic heart failure. This is due to atrial fibrillation with RVR.  Recommendation: I am not very convinced that I will be able to get her heart rate controlled to the perfect level, I would except a heart rate of 100-110 beats a minute at rest and on 120 beats per minute with activity, patient extremely well-nourished with severe hypoalbuminemia, severe anemia and hence rate control would be extremely difficult. At this point conservative therapy, comfort measures, would recommend discharge tomorrow on Amiodarone 200 mg by mouth. Once her heart rate is controlled, I plan on discontinuation of Cardizem and continuing beta blocker and amiodarone for rate control.  With regard to possible myelodysplastic syndrome, I do not anticipate any significant resolution of issues, patient is 79 years of age, very frail, I'm not sure further evaluation is indicated at this time.  Pamella Pert, M.D. 04/12/2013, 8:44 AM [facility] Pager: [phone] Office: [phone] If no answer: [phone]

## 2013-04-12 NOTE — Progress Notes (Signed)
Clinical Social Work Department BRIEF PSYCHOSOCIAL ASSESSMENT 04/12/2013  Patient:  Lisa Nixon, Lisa Nixon     Account Number:  [id number]     Admit date:  04/10/2013  Clinical Social Worker:  Jacelyn Grip  Date/Time:  04/12/2013 02:38 PM  Referred by:  Physician  Date Referred:  04/12/2013 Referred for  SNF Placement   Other Referral:   Interview type:  Patient Other interview type:   and patient family at bedside    PSYCHOSOCIAL DATA Living Status:  HUSBAND Admitted from facility:   Level of care:   Primary support name:  Britta Mccreedy Bunn/[phone] Primary support relationship to patient:  CHILD, ADULT Degree of support available:   strong    CURRENT CONCERNS Current Concerns  Post-Acute Placement   Other Concerns:    SOCIAL WORK ASSESSMENT / PLAN CSW received referral for New SNF.    CSW met with pt and multiple family members at bedside. CSW familiar with pt from previous admission. CSW discussed recommendation from MD that pt will need rehab prior to pt return home. CSW discussed that CSW aware that pt has been at [facility] before and CSW inquired with pt and pt family about if pt would like to return to [facility] or explore other SNF options.    Pt discussed that she was familiar with [facility] and felt that she will be more comfortable returning to [facility] and the transition to rehab as she had really hoped to be able to return home.    CSW completed FL2 and initiated SNF search to [facility]. CSW contacted facility admission coordinator and facility confirmed that they could offer pt a bed and stated that they would initiate insurance authorization.    CSW spoke to RN and left sticky note from MD requesting PT eval be ordered as pt insurance requires authorization prior to d/c to SNF.    CSW to continue to follow and facilitate pt discharge needs when pt medically stable for discharge and inusrance  authorization received.   Assessment/plan status:  Psychosocial Support/Ongoing Assessment of Needs Other assessment/ plan:   discharge planning   Information/referral to community resources:   Referral to [facility].    PATIENT'S/FAMILY'S RESPONSE TO PLAN OF CARE: Pt alert and oriented x 4. Pt was hopeful to be able to return home, but recognizes that rehab will be beneficial. Pt family supportive of pt and encouraging pt to go to rehab at [facility].    Jacklynn Lewis, MSW, LCSWA  Clinical Social Work [phone]

## 2013-04-13 ENCOUNTER — Inpatient Hospital Stay (HOSPITAL_COMMUNITY): Payer: Medicare FFS

## 2013-04-13 DIAGNOSIS — R6881 Early satiety: Secondary | ICD-10-CM

## 2013-04-13 DIAGNOSIS — R627 Adult failure to thrive: Secondary | ICD-10-CM

## 2013-04-13 DIAGNOSIS — Z515 Encounter for palliative care: Secondary | ICD-10-CM

## 2013-04-13 LAB — CBC
Hemoglobin: 10.4 g/dL — ABNORMAL LOW (ref 12.0–15.0)
MCH: 29.4 pg (ref 26.0–34.0)
MCV: 89.5 fL (ref 78.0–100.0)
RBC: 3.54 MIL/uL — ABNORMAL LOW (ref 3.87–5.11)

## 2013-04-13 LAB — PROTIME-INR: INR: 2.69 — ABNORMAL HIGH (ref 0.00–1.49)

## 2013-04-13 LAB — BASIC METABOLIC PANEL
BUN: 9 mg/dL (ref 6–23)
Calcium: 8.7 mg/dL (ref 8.4–10.5)
GFR calc non Af Amer: 63 mL/min — ABNORMAL LOW (ref >90)
Sodium: 129 mEq/L — ABNORMAL LOW (ref 135–145)

## 2013-04-13 MED ORDER — WARFARIN 0.5 MG HALF TABLET
0.5000 mg | ORAL_TABLET | Freq: Once | ORAL | Status: AC
  Administered 2013-04-13: 0.5 mg via ORAL
  Filled 2013-04-13: qty 1

## 2013-04-13 MED ORDER — WARFARIN - PHARMACIST DOSING INPATIENT
Freq: Every day | Status: DC
  Administered 2013-04-15: 20:00:00

## 2013-04-13 NOTE — Progress Notes (Signed)
ANTICOAGULATION CONSULT NOTE - Follow Up Consult  Pharmacy Consult for Warfarin Indication: atrial fibrillation  Allergies  Allergen Reactions  . Penicillins Nausea And Vomiting    "almost pass out"    Patient Measurements: Height: 5\' 4"  (162.6 cm) Weight: 110 lb 7.2 oz (50.1 kg) IBW/kg (Calculated) : 54.7  Vital Signs: Temp: 97.6 F (36.4 C) (12/12 0356) Temp src: Oral (12/12 0356) BP: 132/86 mmHg (12/12 0800) Pulse Rate: 119 (12/12 0800)  Labs:  Recent Labs  04/10/13 1643 04/11/13 0730 04/12/13 0348 04/13/13 0350  HGB 9.5* 8.8* 8.8*  --   HCT 29.2* 27.4* 27.4*  --   PLT 472* 439* 413*  --   LABPROT 31.4* 32.0* 30.7* 27.7*  INR 3.17* 3.25* 3.08* 2.69*  CREATININE 1.29* 1.19* 0.91  --     Estimated Creatinine Clearance: 33.8 ml/min (by C-G formula based on Cr of 0.91).   Medications:  Scheduled:  . amiodarone  400 mg Oral TID  . diltiazem  240 mg Oral Daily  . feeding supplement (ENSURE COMPLETE)  237 mL Oral BID BM  . metoprolol succinate  50 mg Oral BID  . mirtazapine  15 mg Oral QHS  . pantoprazole  40 mg Oral Daily  . polyethylene glycol  17 g Oral Daily  . potassium chloride  40 mEq Oral Daily  . senna-docusate  2 tablet Oral BID   Infusions:  . dextrose 5 % and 0.45% NaCl 75 mL/hr at 04/12/13 2245    Assessment: 20 yoF admitted 12/9 with abdominal pain, constipation, Afib with RVR on chronic warfarin therapy, anemia, malnutrition.  INR on admission was supra-therapeutic. Pharmacy was consulted to monitor warfarin.  PTA warfarin: 2.5mg  daily except 5mg  on Monday and Wednesdays.  Last dose taken on 12/7 (dose held on 12/8 for INR 4.7)  INR down into therapeutic range now at 2.69.  Poor appetite continues.   Note drug-drug interaction with Amiodarone (started 12/10): will increase serum concentrations of warfarin as it reaches steady-state over the next month or so. Will need lower doses of warfarin.  No CBC today.  Goal of Therapy:  INR  2-3   Plan:   Give warfarin 0.5mg  at 10am. It's difficult to predict how INR will respond but I want to keep it from dropping too quickly.  Pharmacy to f/u INR daily.  When discharged, recommend resuming lower dose of warfarin 2.5mg  daily or less, with close follow-up of INR.   Charolotte Eke, PharmD, pager [phone]. 04/13/2013,9:05 AM.

## 2013-04-13 NOTE — Progress Notes (Signed)
Patient's IV infiltrated.  Attempts by IV team to regain IV access were unsuccessful.  MD notified.  Will continue to monitor.

## 2013-04-13 NOTE — Consult Note (Signed)
Patient Lisa Nixon      DOB: [birth year]      BJY:[id number]     Consult Note from the Palliative Medicine Team at [facility]    Consult Requested by: Dr. Mahala Menghini    PCP: Pamella Pert, MD Reason for Consultation GOC     Phone Number:[phone] Related symptom recommendations Assessment of patients Current state: Patient is an 79 year old African American female who has been failing to thrive for some time. Patient was discharged with a long hospital 1125 status post exacerbation of A. fib with RVR. Patient was seen as an outpatient and found to have low blood pressure and elevated heart rate. She was readmitted to the hospital with exacerbation of atrial fibrillation and worsening decrease in oral intake. He met with the patient, her elderly spouse, her daughter Lisa Nixon and her sons Lisa Nixon and Lisa Nixon. Lisa Nixon has been Lisa Nixon Rung was also present. The patient has a history of being an Charity fundraiser but is not able to apply previous knowledge to her current decision-making. We openly discussed the patient's prognosis indicating that if it progresses in the same pattern I would not be surprised if she lost her life in 6 months or less. We were able to discuss these feeding tubes and indicated that at this time she is likely not a candidate due to her previous surgery and esophageal dysmotility. We discussed hospice care which would require acceptance of the part of the patient of comfort care. She meets the criteria based on the 6 months or less prognosis likely related to her cardiac disease and severe protein calorie malnutrition. Patient states that she needs to discuss these matters are present with her family before making a decision. I am powered her family with information regarding rehabilitation stay versus hospice. I reinforced a realistic prognosis of the family so that they could assist her mom in making reasonable decisions. The likelihood of her rehabilitating are very small.   Goals of Care: 1.   Code Status: Patient desires full CODE STATUS at this time   2. Scope of Treatment: Continue current curative treatments as family discussed his goals of care.   4. Disposition: To be determined. This patient likely meets qualifications for residential/ hospice home. In discussing this with the family it is unlikely that having hospice at her own home will be unlikely as family cannot provide 24 7 care. I do not believe patient will meet rehabilitation guidelines for skilled nursing placement but if she does have a minimum palliative care services should be requested to follow   3. Symptom Management: 1. Constipation. Agree with MiraLAX and senna, and or lactulose hold if diarrhea/loose stool persists. 2. Chronic pain continue oral oxycodone 3. Failure to thrive, severe protein calorie malnutrition: Continue nutritional supplementation and Remeron 4. Prognosis: Definitely 6 months or less, borderline on short stay prognosis for residential hospice   4. Psychosocial: Patient was an Charity fundraiser. She is to had 2 marriages. She has 3 children and several stepchildren  5. Spiritual: Rushie Goltz is important to her have offered chaplain support.        Patient Documents Completed or Given: Document Given Completed  Advanced Directives Pkt    MOST    DNR    Gone from My Sight    Hard Choices  5 copies      Brief HPI: 79 year old African American female with limited judgment and insight related to memory loss with multiple complex medical issues including heart failure, failure to thrive, atrial fibrillation with RVR comforting  calorie malnutrition and debility secondary to all the above. We've been asked to assist with goals of care   ROS: Intermittent crampy abdominal discomfort, intermittent shortness of breath, intermittent chest pain, loss of sense of taste. Denies nausea or vomiting   PMH:  Past Medical History  Diagnosis Date  . Atrial fibrillation   . Hyperlipemia   . GERD  (gastroesophageal reflux disease)   . Hypertension   . Breast cancer   . Angina at rest      PSH: Past Surgical History  Procedure Laterality Date  . Breast surgery    . Lymphadenectomy    . Hiatal hernia repair    . Cesarean section     I have reviewed the FH and SH and  If appropriate update it with new information. Allergies  Allergen Reactions  . Penicillins Nausea And Vomiting    "almost pass out"   Scheduled Meds: . amiodarone  400 mg Oral TID  . diltiazem  240 mg Oral Daily  . feeding supplement (ENSURE COMPLETE)  237 mL Oral BID BM  . metoprolol succinate  50 mg Oral BID  . mirtazapine  15 mg Oral QHS  . pantoprazole  40 mg Oral Daily  . polyethylene glycol  17 g Oral Daily  . potassium chloride  40 mEq Oral Daily  . senna-docusate  2 tablet Oral BID  . Warfarin - Pharmacist Dosing Inpatient   Does not apply q1800   Continuous Infusions: . dextrose 5 % and 0.45% NaCl 75 mL/hr at 04/12/13 2245   PRN Meds:.lactulose, ondansetron (ZOFRAN) IV, oxyCODONE, oxyCODONE-acetaminophen    BP 103/59  Pulse 63  Temp(Src) 97.7 F (36.5 C) (Oral)  Resp 26  Ht 5\' 4"  (1.626 m)  Wt 50.1 kg (110 lb 7.2 oz)  BMI 18.95 kg/m2  SpO2 98%   PPS: 20-30%   Intake/Output Summary (Last 24 hours) at 04/13/13 1745 Last data filed at 04/13/13 1600  Gross per 24 hour  Intake   1715 ml  Output    220 ml  Net   1495 ml   LBM: 04/13/2013                     Physical Exam:  General: Cachectic elderly female, intermittent abdomen cramping HEENT:  Pupils equal round and reactive to light, extraocular muscles intact, mucous membranes dry, wig in place Chest:   Decreased but clear to auscultation anteriorly no rhonchi rales or wheezes CVS: Irregular positive S1 and S2 no S3-S4 Abdomen: Scaphoid minimally tender left upper quadrant no guarding or rebound Ext: Thin frail no lesions Neuro: Patient is oriented to time place person but has judgment and insight issues related to complex  matters  Labs: CBC    Component Value Date/Time   WBC 11.3* 04/13/2013 1030   WBC 8.1 05/30/2006 1519   RBC 3.54* 04/13/2013 1030   RBC 3.79 05/30/2006 1519   HGB 10.4* 04/13/2013 1030   HGB 12.1 05/30/2006 1519   HCT 31.7* 04/13/2013 1030   HCT 35.2 05/30/2006 1519   PLT 412* 04/13/2013 1030   PLT 384 05/30/2006 1519   MCV 89.5 04/13/2013 1030   MCV 93.0 05/30/2006 1519   MCH 29.4 04/13/2013 1030   MCH 32.0 05/30/2006 1519   MCHC 32.8 04/13/2013 1030   MCHC 34.4 05/30/2006 1519   RDW 17.4* 04/13/2013 1030   RDW 13.8 05/30/2006 1519   LYMPHSABS 1.7 03/23/2013 1610   LYMPHSABS 2.1 05/30/2006 1519   MONOABS 0.9 03/23/2013 1610  MONOABS 1.4* 05/30/2006 1519   EOSABS 0.0 03/23/2013 1610   EOSABS 0.1 05/30/2006 1519   BASOSABS 0.0 03/23/2013 1610   BASOSABS 0.0 05/30/2006 1519      CMP     Component Value Date/Time   NA 129* 04/13/2013 1030   K 3.5 04/13/2013 1030   CL 100 04/13/2013 1030   CO2 20 04/13/2013 1030   GLUCOSE 131* 04/13/2013 1030   BUN 9 04/13/2013 1030   CREATININE 0.81 04/13/2013 1030   CALCIUM 8.7 04/13/2013 1030   PROT 5.7* 04/12/2013 0348   ALBUMIN 1.5* 04/12/2013 0348   AST 36 04/12/2013 0348   ALT 8 04/12/2013 0348   ALKPHOS 75 04/12/2013 0348   BILITOT 0.3 04/12/2013 0348   GFRNONAA 63* 04/13/2013 1030   GFRAA 73* 04/13/2013 1030    Chest Xray Reviewed/Impressions:  Enlargement of cardiac silhouette with pulmonary vascular  congestion.  COPD changes.  No acute abnormalities  DG Esophagus:  Nonspecific esophageal dysmotility with severe esophageal stasis.  Postoperative changes of fundoplication. Gastroesophageal junction  was observed open only to about 1 cm.       Time In Time Out Total Time Spent with Patient Total Overall Time  405 pm 530 pm 85 min 85 min    Greater than 50%  of this time was spent counseling and coordinating care related to the above assessment and plan.    Esty Ahuja L. Ladona Ridgel, MD MBA The Palliative Medicine  Team at [facility] Phone: [phone] Pager: [phone]

## 2013-04-13 NOTE — Progress Notes (Signed)
CSW continuing to follow for disposition planning.  CSW reviewed chart and noted that pt has PMT GOC meeting planned.   CSW received phone call from [facility] stating that facility received insurance authorization for admission to SNF from [facility] if SNF continues to be recommendation for pt.  CSW to follow up with recommendations from PMT GOC meeting.   CSW to continue to follow to assist with pt discharge planning needs   Jacklynn Lewis, MSW, [facility]  Clinical Social Work [phone]

## 2013-04-13 NOTE — Consult Note (Signed)
Patient ZO:XWRUEA [name]      DOB: Apr 15, 1923      VWU:[id number]  Summary of Goals of care; full note to follow:  Met with Patient , her spouse, daughter Lisa Nixon/her spouse Lisa Nixon, sons Lisa Nixon and Lisa Nixon   Patient and her spouse are very debilitated. They are both hearing impaired and I am not sure that they both have clear comprehension regarding Mrs. Mapel's prognosis and condition.  I explain to them both her complex conditions and our concern for her future declining health.  We talked honestly about DNR status, and hospice options vs home health. Patient's best list of goals was to get some rehab to get stronger and to be able to eat more.  Unfortunately, she will likely not do well with feeding tube, but we did talk about this as well.  Mrs. Kaeser indicated that she needed to speak in private about these things with her husband and children.  I spoke with her son Lisa Nixon aside to help him understand the path before [place].  I will ask the Weekend social worker to help them get started with medicaid application.  I will return over the weekend to check in on them but for now we are status quo.  Patient remains full code for now.  Total time 405 pm- 530 pm  Recommend : Continued goals of care  Failure to thrive :  Continue Remeron, I don't think that we are going to be able impact on appetite it appears to be multifactorial-  Patient reports inability to taste/smell, and cardiac cachexia may be playing a role.  Patient desires Full code status.   Raedyn Klinck L. Ladona Ridgel, MD MBA The Palliative Medicine Team at [facility] Phone: [phone] Pager: [phone]

## 2013-04-13 NOTE — Evaluation (Signed)
Clinical/Bedside Swallow Evaluation Patient Details  Name: CHASLYN EISEN MRN: [id number] Date of Birth: [birth year]  Today's Date: 04/13/2013 Time: 4098-1191 SLP Time Calculation (min): 20 min  Past Medical History:  Past Medical History  Diagnosis Date  . Atrial fibrillation   . Hyperlipemia   . GERD (gastroesophageal reflux disease)   . Hypertension   . Breast cancer   . Angina at rest    Past Surgical History:  Past Surgical History  Procedure Laterality Date  . Breast surgery    . Lymphadenectomy    . Hiatal hernia repair    . Cesarean section     HPI:  79 year old female admitted 04/10/13 due to increased heart rate and low BP.  PMH significant for Afib, failure to thrive, HTN, anemia, GERD, COPD.  CXR reveals nothing acute.  BSE requested due to early satiety. RN reports no overt difficulty swallowing.   Assessment / Plan / Recommendation Clinical Impression  No overt s/s aspiration observed or reported.  Pt does not indicate history of any difficulty, and does not report globus sensation or the sensation of food sticking. Recommend continuing with current (regular) diet/thin liquids.    Aspiration Risk  Mild    Diet Recommendation Thin liquid;Regular   Liquid Administration via: Cup;Straw Medication Administration: Whole meds with liquid Supervision: Patient able to self feed Compensations: Slow rate;Small sips/bites Postural Changes and/or Swallow Maneuvers: Seated upright 90 degrees;Upright 30-60 min after meal    Other  Recommendations Oral Care Recommendations: Oral care BID   Follow Up Recommendations  None            Pertinent Vitals/Pain VSS,No pain reported    Swallow Study Prior Functional Status   Poor po intake for some time, but tolerating regular consistencies and thin liquids prior to admit.    General Date of Onset: 04/10/13 HPI: 79 year old female admitted 04/10/13 due to increased heart rate and low BP.  PMH significant for Afib,  failure to thrive, HTN, anemia, GERD, COPD.  CXR reveals nothing acute.  BSE requested due to early satiety. RN reports no overt difficulty swallowing. Type of Study: Bedside swallow evaluation Previous Swallow Assessment: none found Diet Prior to this Study: Regular;Thin liquids Temperature Spikes Noted: No Respiratory Status: Room air History of Recent Intubation: No Behavior/Cognition: Alert;Cooperative;Pleasant mood Oral Cavity - Dentition: Adequate natural dentition Self-Feeding Abilities: Able to feed self Patient Positioning: Upright in bed Baseline Vocal Quality: Clear Volitional Cough: Strong Volitional Swallow: Able to elicit    Oral/Motor/Sensory Function Overall Oral Motor/Sensory Function: Appears within functional limits for tasks assessed   Ice Chips Ice chips: Not tested   Thin Liquid Thin Liquid: Within functional limits Presentation: Cup;Straw    Nectar Thick Nectar Thick Liquid: Not tested   Honey Thick Honey Thick Liquid: Not tested   Puree Puree: Not tested   Solid   GO    Solid: Within functional limits Presentation: Self Fed      Joncarlo Friberg B. [place], MSP, CCC-SLP [phone]  Leigh Aurora 04/13/2013,1:45 PM

## 2013-04-13 NOTE — Progress Notes (Signed)
Thank you for consulting the Palliative Medicine Team at [facility] to meet your patient's and family's needs.   The reason that you asked [place] to see your patient is for goals of care and options.  We have scheduled your patient for a meeting: Friday 04/13/13 1600  The Surrogate decision make is: Pt seems capable but designates Leverne Humbles (daughter) Contact information: [phone]  Other family members that need to be present: Daughter-Jean and son-William per pt request.   Your patient is able/unable to participate: Able to participate  Yong Channel, NP Palliative Medicine Team Team Phone # [phone]

## 2013-04-13 NOTE — Progress Notes (Signed)
[facility]-Asked by Dr. Jacinto Halim to Re-assume care of this patient as multiple medical illnesses and possibility of need for Hospice and Palliative care involvement   Lisa Nixon ZOX:[id number] DOB: 29-Mar-1923 DOA: 04/10/2013 PCP: Pamella Pert, MD  Brief narrative: 79 y.o. female with history of A. fib on Coumadin, severe protein malnutrition, hypertension, iron deficiency anemia, GERD, chronic constipation who presented to [facility] ER was admitted and discharged from [facility] on 11/25 due to Afib with RVR, she was treated with Diltiazem gtt then, she has been having issues related to her HR and soft BP for 1-2 weeks.  She was seen by Dr. [name] 12/8, her blood pressure was soft then, diltiazem was discontinued.  Today her home health nurse went to check on her and found her heart rate to be in the 140s and blood pressure less than 90 systolic and hence her PCP was called and sent to the ER.  She has lost a lot of weight over the last few months-Daughter has been trying to give some Constiption meds-she doesn't eat enough to warrant enough stool per daughter  Because of continued Rapid Afib, patient placed initially on Amio gt, Diltiazem gtt and po metoprolol under expert guidance of Dr. Jacinto Halim, Cardiology   Past medical history-As per Problem list Chart reviewed as below- Extensive-see below  Consultants:  Dr. Jacinto Halim Cardiology  Procedures:   None  Antibiotics:  none   Subjective  Feels fair.  More alert per nursing. Large Bm, yesterday No pain or discomfort.  Feels overall weak and has chronic LBP as well as SOB Only ambulated 2-3 steps and quickly fatigued hardly eating or drinking as endorses feeling "full" a lot more often than prior    Objective    Interim History: none  Telemetry: Afib ranging between 90-120  Objective: Filed Vitals:   04/13/13 0356 04/13/13 0400 04/13/13 0600 04/13/13 0800  BP:  118/72 103/65 132/86  Pulse:  92 116 119  Temp: 97.6 F  (36.4 C)     TempSrc: Oral     Resp:  16 16 14   Height:      Weight: 50.1 kg (110 lb 7.2 oz)     SpO2:  97% 98% 99%    Intake/Output Summary (Last 24 hours) at 04/13/13 8119 Last data filed at 04/13/13 0800  Gross per 24 hour  Intake   1510 ml  Output    230 ml  Net   1280 ml    Exam:  General: Alert frail temporal wasting Cardiovascular: no bruit, S1-S2? S3 left lower sternal edge Respiratory: Clinically clear no crackles Abdomen: Soft nontender nondistended surgical scars scaphoid abdomen noted, stool in diaper Skin Noted area of infiltration of left arm as above Neuro grossly intact moving all 4 extremities equally  Data Reviewed: Basic Metabolic Panel:  Recent Labs Lab 04/10/13 1643 04/11/13 0727 04/11/13 0730 04/12/13 0348  NA 130*  --  130* 130*  K 4.9  --  3.1* 3.8  CL 96  --  98 100  CO2 22  --  20 20  GLUCOSE 107*  --  107* 104*  BUN 17  --  15 12  CREATININE 1.29*  --  1.19* 0.91  CALCIUM 9.0  --  8.3* 8.4  MG 1.4* 1.3*  --   --    Liver Function Tests:  Recent Labs Lab 04/10/13 1643 04/12/13 0348  AST 45* 36  ALT 11 8  ALKPHOS 69 75  BILITOT 0.6 0.3  PROT 6.5 5.7*  ALBUMIN 1.8* 1.5*   No results found for this basename: LIPASE, AMYLASE,  in the last 168 hours No results found for this basename: AMMONIA,  in the last 168 hours CBC:  Recent Labs Lab 04/10/13 1643 04/11/13 0730 04/12/13 0348 04/13/13 1030  WBC 13.9* 13.6* 17.1* 11.3*  HGB 9.5* 8.8* 8.8* 10.4*  HCT 29.2* 27.4* 27.4* 31.7*  MCV 88.8 88.4 88.7 89.5  PLT 472* 439* 413* 412*   Cardiac Enzymes: No results found for this basename: CKTOTAL, CKMB, CKMBINDEX, TROPONINI,  in the last 168 hours BNP: No components found with this basename: POCBNP,  CBG: No results found for this basename: GLUCAP,  in the last 168 hours  Recent Results (from the past 240 hour(s))  MRSA PCR SCREENING     Status: None   Collection Time    04/10/13  8:18 PM      Result Value Range Status    MRSA by PCR NEGATIVE  NEGATIVE Final   Comment:            The GeneXpert MRSA Assay (FDA     approved for NASAL specimens     only), is one component of a     comprehensive MRSA colonization     surveillance program. It is not     intended to diagnose MRSA     infection nor to guide or     monitor treatment for     MRSA infections.     Studies:              All Imaging reviewed and is as per above notation   Scheduled Meds: . amiodarone  400 mg Oral TID  . diltiazem  240 mg Oral Daily  . feeding supplement (ENSURE COMPLETE)  237 mL Oral BID BM  . metoprolol succinate  50 mg Oral BID  . mirtazapine  15 mg Oral QHS  . pantoprazole  40 mg Oral Daily  . polyethylene glycol  17 g Oral Daily  . potassium chloride  40 mEq Oral Daily  . senna-docusate  2 tablet Oral BID  . Warfarin - Pharmacist Dosing Inpatient   Does not apply q1800   Continuous Infusions: . dextrose 5 % and 0.45% NaCl 75 mL/hr at 04/12/13 2245     Assessment/Plan: Patient Active Problem List   Diagnosis Date Noted  . Hyposmolality and/or hyponatremia- Suspect given history of poor diet, this is likely secondary to euvolemic hyponatremia with salt wasting.  UOsm 367  Continue D5/ns 75 cc/hour.   04/11/2013  . Failure to thrive syndrome, adult, likely secondary to multiple debilitating illnesses 04/11/2013  . Tissue necrosis of left arm-likely secondary to arm infiltration 2/2 to diltiazem in the past 04/11/2013  . Constipation, chronic-passed large stool 12/11t  03/23/2013  . Atrial fibrillation with RVR-unclear how compliant patient is on medical therapy-Appreciate guidance from cardiology.  Continue Amiodarone 400 mg tid, Diltiazem 240 mgm, metoprolol 50 bid 02/22/2013  . History of pulmonary embolism 2008-continue coumadin 02/22/2013  . Myalgia and myositis 02/22/2013  . Leukocytosis, unspecified-unclear etiology-outpatient workup  02/22/2013  . Thrombocytosis-likely secondary and chronic since 2014 November.?  Possibility of Myelodysplasia-HOld work-up for now. 02/22/2013  . Anemia-chronic , borderline microcytic and likely secondary to anemia chronic disease 02/22/2013  . Diabetes-likely diet-controlled . Would not control aggressively at this age her stage, 131-107 02/22/2013  . Protein-calorie malnutrition, severe-dietitian consult as outpatient  02/21/2013    Early Satiety-Get Barium swallow and speech eval.  Dr. Jacinto Halim has discussed with patient  tentative GOC.  Am not sure if family amenable to feeding tube.  Will discuss once organic pathology has been ruled out     Adult Failure to thrive in setting of multiple acute illneses-monitor.  Get nutritionist involved.  Palliative performance score [Karnofsky ~40].  Appreicate Palliative medicine input in advance     Compensated CHF Ef 60-65%-is actually Volume depleted.  Cont IVF 75cc/hr     Depression-Continue Mirtazipine.  Consider addition Megace, although no data to suppor tits use     H/o Breast cancer-s/p XRT 08/2006 & Lumpectomy                Code Status: Confirmed full code with family  Family Communication: brief discussion with Daughter on [place]op[place] Disposition Plan: Step down for now, possibnle tele transfer in am-Probably SNF   Pleas Koch, MD  [facility] Pager [phone] 04/13/2013, 9:22 AM    LOS: 3 days

## 2013-04-14 LAB — CBC
MCHC: 32.6 g/dL (ref 30.0–36.0)
MCV: 89.3 fL (ref 78.0–100.0)
Platelets: 330 10*3/uL (ref 150–400)
RBC: 2.99 MIL/uL — ABNORMAL LOW (ref 3.87–5.11)
RDW: 17.5 % — ABNORMAL HIGH (ref 11.5–15.5)
WBC: 11.2 10*3/uL — ABNORMAL HIGH (ref 4.0–10.5)

## 2013-04-14 LAB — COMPREHENSIVE METABOLIC PANEL
ALT: 10 U/L (ref 0–35)
Albumin: 1.3 g/dL — ABNORMAL LOW (ref 3.5–5.2)
Alkaline Phosphatase: 69 U/L (ref 39–117)
BUN: 9 mg/dL (ref 6–23)
Chloride: 102 mEq/L (ref 96–112)
GFR calc Af Amer: 73 mL/min — ABNORMAL LOW (ref >90)
GFR calc non Af Amer: 63 mL/min — ABNORMAL LOW (ref >90)
Glucose, Bld: 97 mg/dL (ref 70–99)
Potassium: 3.7 mEq/L (ref 3.5–5.1)
Total Bilirubin: 0.5 mg/dL (ref 0.3–1.2)

## 2013-04-14 MED ORDER — LACTULOSE 10 GM/15ML PO SOLN
20.0000 g | Freq: Two times a day (BID) | ORAL | Status: DC | PRN
  Filled 2013-04-14: qty 30

## 2013-04-14 MED ORDER — WARFARIN 0.5 MG HALF TABLET
0.5000 mg | ORAL_TABLET | Freq: Once | ORAL | Status: AC
  Administered 2013-04-14: 0.5 mg via ORAL
  Filled 2013-04-14 (×2): qty 1

## 2013-04-14 MED ORDER — SODIUM CHLORIDE 0.9 % IJ SOLN
10.0000 mL | Freq: Two times a day (BID) | INTRAMUSCULAR | Status: DC
  Administered 2013-04-14: 10 mL

## 2013-04-14 MED ORDER — SODIUM CHLORIDE 0.9 % IJ SOLN: 10.0000 mL | INTRAMUSCULAR | Status: DC | PRN

## 2013-04-14 NOTE — Progress Notes (Signed)
Midline Placement  The IV Nurse has discussed with the patient and/or persons authorized to consent for the patient, the purpose of this procedure and the potential benefits and risks involved with this procedure.  The benefits include less needle sticks, lab draws from the catheter and patient may be discharged home with the catheter.  Risks include, but not limited to, infection, bleeding, blood clot (thrombus formation), and puncture of an artery; nerve damage and irregular heat beat.  Alternatives to this procedure were also discussed.  PICC/Midline Placement Documentation  PICC / Midline Single Lumen 04/14/13 Midline Right Brachial 16 cm 0 cm (Active)  Indication for Insertion or Continuance of Line Poor Vasculature-patient has had multiple peripheral attempts or PIVs lasting less than 24 hours 04/14/2013  5:00 PM  Exposed Catheter (cm) 0 cm 04/14/2013  5:00 PM  Site Assessment Clean;Dry;Intact 04/14/2013  5:00 PM  Line Status Flushed;Saline locked;Blood return noted 04/14/2013  5:00 PM  Dressing Type Transparent 04/14/2013  5:00 PM  Dressing Status Clean;Dry;Intact;Antimicrobial disc in place 04/14/2013  5:00 PM  Dressing Intervention New dressing 04/14/2013  5:00 PM  Dressing Change Due 04/21/13 04/14/2013  5:00 PM       Lisa Nixon 04/14/2013, 5:35 PM

## 2013-04-14 NOTE — Progress Notes (Signed)
Asked by Dr. Ladona Ridgel to discuss the [place] application process with Pt's son.  Met with Pt's son.  Son requesting a hard-copy of the [place] application.  Printed a hard-copy for Pt's son and reviewed the steps in the process with him.  Provided him with [place] DSS's address, should he choose to mail in the application.  CSW to continue to follow for d/c plans.  [place], LCSWA Clinical Social Work [phone]

## 2013-04-14 NOTE — Progress Notes (Signed)
Discuss with Pt. and family insertion of midline including the risks and benefits.  Family not sure if wish to proceed and wish to discuss this with pt's husband.  Will follow up later with pt.

## 2013-04-14 NOTE — Progress Notes (Signed)
Subjective: late entry from 04/13/2013  8 AM. No specific complaints, patient laying in bed, discussed with the nursing, patient not having any significant oral intake, last night and to give her IV fluid bolus and she is maintained on IV fluids.  Heart rate is much better controlled between 80 beats to 110-120 bpm. Objective:  Vital Signs in the last 24 hours: Temp:  [97.4 F (36.3 C)-98.4 F (36.9 C)] 97.4 F (36.3 C) (12/13 0400) Pulse Rate:  [63-144] 108 (12/13 0600) Resp:  [12-26] 19 (12/13 0600) BP: (93-149)/(45-90) 96/66 mmHg (12/13 0600) SpO2:  [90 %-99 %] 90 % (12/13 0600) Weight:  [51.5 kg (113 lb 8.6 oz)] 51.5 kg (113 lb 8.6 oz) (12/13 0400)  Intake/Output from previous day: 12/12 0701 - 12/13 0700 In: 1050 [P.O.:300; I.V.:750] Out: 275 [Urine:275]  Physical Exam: General appearance: alert, cooperative, appears stated age and no distress, petite and frail.  Eyes: negative findings: lids and lashes normal  Neck: no adenopathy, no carotid bruit, no JVD, supple, symmetrical, trachea midline and thyroid not enlarged, symmetric, no tenderness/mass/nodules  Neck: JVP - normal, carotids 2+= without bruits  Resp: clear to auscultation bilaterally  Chest wall: no tenderness  Cardio: S1 is variable, S2 is normal as no gallop or murmur.  GI: soft, non-tender, bowel sounds normal; no masses, no organomegaly  Extremities: Trace ankle edema present. Full range of movements.   Lab Results:  Recent Labs  04/13/13 1030 04/14/13 0347  WBC 11.3* 11.2*  HGB 10.4* 8.7*  PLT 412* 330    Recent Labs  04/13/13 1030 04/14/13 0347  NA 129* 130*  K 3.5 3.7  CL 100 102  CO2 20 19  GLUCOSE 131* 97  BUN 9 9  CREATININE 0.81 0.81     Recent Labs  04/14/13 0347  PROT 5.3*  ALBUMIN 1.3*  AST 24  ALT 10  ALKPHOS 69  BILITOT 0.5   Lipid Panel     Component Value Date/Time   CHOL 126 02/19/2013 0500   TRIG 70 02/19/2013 0500   HDL 46 02/19/2013 0500   CHOLHDL 2.7  02/19/2013 0500   VLDL 14 02/19/2013 0500   LDLCALC 66 02/19/2013 0500   Telemetry: Atrial fibrillation with RVR, heart rate much better improved and averaging around 110 beats per minute.  Assessment/Plan:  1. Atrial fibrillation with rapid ventricular response, patient presently much better heart rate controlled on IV Cardizem, patient now transitioned to by mouth amiodarone and Toprol.  2. Severe anemia and hypoproteinemia and malnutrition  3. Acute on chronic renal insufficiency, now resolved with hydration.  4. Subcutaneous dopamine infiltrate leading to chemical-induced skin necrosis left antecubital fossa that occurred on prior admission in September of 2014.  5. Chronic leukocytosis and thrombocytosis, in terms of anemia may suggest myelodysplastic syndrome. 6. Acute on chronic diastolic heart failure. This is due to atrial fibrillation with RVR.  Recommendation: I am not very convinced that I will be able to get her heart rate controlled to the perfect level, I would except a heart rate of 100-110 beats a minute at rest and on 120 beats per minute with activity, patient extremely mal-nourished with severe hypoalbuminemia, severe anemia and hence rate control would be extremely difficult. At this point conservative therapy, comfort measures, would recommend.  I called patient's daughter and also separately called her son and discussed at length regarding palliative care, comfort measures.  Patient cannot stand even for 2 steps without complaining about marked generalized weakness.  With decreased oral intake,  given her multiple comorbidities, I'm not sure that we will be able to accomplish heart rate control without causing adverse events with medications, also we cannot continue IV fluid therapy for indefinitely.  I have requested hospitalist program to transfer the care as from cardiac standpoint not much can be done.  I am skeptical about using digoxin, with renal insufficiency,  decreased by mouth intake, malnutrition should be at a high risk for digoxin toxicity.  Would recommend decreasing the dose of amiodarone to 200 mg twice a day upon discharge with outpatient follow-up with me.  In my absence, 04/16/2013 through 04/26/2013 Dr. Sharyn Lull will  Be covering me. I do not anticipate any significant resolution of issues, patient is 79 years of age, very frail.  Pamella Pert, M.D. 04/14/2013, 7:17 AM [facility] Pager: [phone] Office: [phone] If no answer: [phone]

## 2013-04-14 NOTE — Progress Notes (Signed)
Patient ZO:XWRUEA EMIAH PELLICANO      DOB: 10-11-1922      VWU:[id number]  Patient has eaten much better today .  Noted multiple stools over last few days.  Offered encouragement and emotional support.  Spoke with son and arranged to discuss medicaid application.  PICC line placed.  Family still discussing advanced care planning. Patient denies abdomen pain.  Will continue to assist with goals of care.  Darius Fillingim L. Ladona Ridgel, MD MBA The Palliative Medicine Team at [facility] Phone: [phone] Pager: [phone]

## 2013-04-14 NOTE — Progress Notes (Signed)
ANTICOAGULATION CONSULT NOTE - Follow Up  Pharmacy Consult for Warfarin Indication: atrial fibrillation  Allergies  Allergen Reactions  . Penicillins Nausea And Vomiting    "almost pass out"    Patient Measurements: Height: 5\' 4"  (162.6 cm) Weight: 113 lb 8.6 oz (51.5 kg) IBW/kg (Calculated) : 54.7  Vital Signs: Temp: 97.3 F (36.3 C) (12/13 0800) Temp src: Oral (12/13 0800) BP: 114/77 mmHg (12/13 0800) Pulse Rate: 92 (12/13 0900)  Labs:  Recent Labs  04/12/13 0348 04/13/13 0350 04/13/13 1030 04/14/13 0347  HGB 8.8*  --  10.4* 8.7*  HCT 27.4*  --  31.7* 26.7*  PLT 413*  --  412* 330  LABPROT 30.7* 27.7*  --  26.1*  INR 3.08* 2.69*  --  2.49*  CREATININE 0.91  --  0.81 0.81    Estimated Creatinine Clearance: 39 ml/min (by C-G formula based on Cr of 0.81).   Medications:  Scheduled:  . amiodarone  400 mg Oral TID  . diltiazem  240 mg Oral Daily  . feeding supplement (ENSURE COMPLETE)  237 mL Oral BID BM  . metoprolol succinate  50 mg Oral BID  . mirtazapine  15 mg Oral QHS  . pantoprazole  40 mg Oral Daily  . polyethylene glycol  17 g Oral Daily  . potassium chloride  40 mEq Oral Daily  . senna-docusate  2 tablet Oral BID  . Warfarin - Pharmacist Dosing Inpatient   Does not apply q1800   Infusions:  . dextrose 5 % and 0.45% NaCl 75 mL (04/14/13 0109)    Assessment: Lisa Nixon admitted 12/9 with abdominal pain, constipation, Afib with RVR on chronic warfarin therapy, anemia, malnutrition.  INR on admission was supra-therapeutic. Pharmacy was consulted to monitor warfarin.  PTA warfarin: 2.5mg  daily except 5mg  on Monday and Wednesdays.  Last dose taken on 12/7 (dose held on 12/8 for INR 4.7)  INR remains in therapeutic range. Trending down more slowly.  Poor appetite continues.   Note drug-drug interaction with Amiodarone(started 12/10): will increase serum concentrations of warfarin as it reaches steady-state over the next month or so. Will need lower  doses of warfarin.  H/H, platelets trended down.  Goal of Therapy:  INR 2-3   Plan:   Repeat warfarin 0.5mg  x 1 today.   Pharmacy to f/u INR daily.  When discharged, recommend resuming lower dose of warfarin 2.5mg  daily or less, with close follow-up of INR.   Charolotte Eke, PharmD, pager [phone]. 04/14/2013,9:51 AM.

## 2013-04-14 NOTE — Progress Notes (Signed)
Lisa Nixon:[id number] DOB: [birth year] DOA: 04/10/2013 PCP: Pamella Pert, MD  Brief narrative: 79 y.o. female with history of A. fib on Coumadin, severe protein malnutrition, hypertension, iron deficiency anemia, GERD, chronic constipation who presented to [facility] ER was admitted and discharged from [facility] on 11/25 due to Afib with RVR, she was treated with Diltiazem gtt then, she has been having issues related to her HR and soft BP for 1-2 weeks.  She was seen by Dr. [name] 12/8, her blood pressure was soft then, diltiazem was discontinued.  Today her home health nurse went to check on her and found her heart rate to be in the 140s and blood pressure less than 90 systolic and hence her PCP was called and sent to the ER.  She has lost a lot of weight over the last few months-Daughter has been trying to give some Constiption meds-she doesn't eat enough to warrant enough stool per daughter  Because of continued Rapid Afib, patient placed initially on Amio gt, Diltiazem gtt and po metoprolol under expert guidance of Dr. Jacinto Halim, Cardiology  Her HR was more controlled as of 04/13/13, and she was tolerating diet a little better.  PAssed large stool 12/12 MOre oriented   Past medical history-As per Problem list Chart reviewed as below- Extensive-see below  Consultants:  Dr. Jacinto Halim Cardiology  Palliative Care 04/13/13  Procedures:   Picc line 04/13/13  Antibiotics:  none   Subjective    More alert per nursing.no further BM today yet, hasn' ordered breakfast Nursing reports has better am appetite. Re-emphasized to take a diet. tol PO fairly only     Objective    Interim History: none  Telemetry: Afib ranging between 90-120  Objective: Filed Vitals:   04/14/13 0600 04/14/13 0700 04/14/13 0800 04/14/13 0900  BP: 96/66  114/77   Pulse: 108 107 110 92  Temp:   97.3 F (36.3 C)   TempSrc:   Oral   Resp: 19 21 15 13   Height:      Weight:      SpO2: 90%  97% 97% 99%    Intake/Output Summary (Last 24 hours) at 04/14/13 9562 Last data filed at 04/14/13 0900  Gross per 24 hour  Intake   1005 ml  Output    275 ml  Net    730 ml    Exam:  General: Alert frail temporal wasting Cardiovascular: no bruit, S1-S2? S3 left lower sternal edge Respiratory: Clinically clear no crackles Abdomen: Soft nontender nondistended surgical scars scaphoid abdomen noted, stool in diaper Skin Woun improved today from prior  Noted area of infiltration of left arm as above Neuro grossly intact moving all 4 extremities equally  Data Reviewed: Basic Metabolic Panel:  Recent Labs Lab 04/10/13 1643 04/11/13 0727 04/11/13 0730 04/12/13 0348 04/13/13 1030 04/14/13 0347  NA 130*  --  130* 130* 129* 130*  K 4.9  --  3.1* 3.8 3.5 3.7  CL 96  --  98 100 100 102  CO2 22  --  20 20 20 19   GLUCOSE 107*  --  107* 104* 131* 97  BUN 17  --  15 12 9 9   CREATININE 1.29*  --  1.19* 0.91 0.81 0.81  CALCIUM 9.0  --  8.3* 8.4 8.7 8.2*  MG 1.4* 1.3*  --   --   --   --    Liver Function Tests:  Recent Labs Lab 04/10/13 1643 04/12/13 0348 04/14/13 0347  AST 45* 36  24  ALT 11 8 10   ALKPHOS 69 75 69  BILITOT 0.6 0.3 0.5  PROT 6.5 5.7* 5.3*  ALBUMIN 1.8* 1.5* 1.3*   No results found for this basename: LIPASE, AMYLASE,  in the last 168 hours No results found for this basename: AMMONIA,  in the last 168 hours CBC:  Recent Labs Lab 04/10/13 1643 04/11/13 0730 04/12/13 0348 04/13/13 1030 04/14/13 0347  WBC 13.9* 13.6* 17.1* 11.3* 11.2*  HGB 9.5* 8.8* 8.8* 10.4* 8.7*  HCT 29.2* 27.4* 27.4* 31.7* 26.7*  MCV 88.8 88.4 88.7 89.5 89.3  PLT 472* 439* 413* 412* 330   Cardiac Enzymes: No results found for this basename: CKTOTAL, CKMB, CKMBINDEX, TROPONINI,  in the last 168 hours BNP: No components found with this basename: POCBNP,  CBG: No results found for this basename: GLUCAP,  in the last 168 hours  Recent Results (from the past 240 hour(s))  MRSA  PCR SCREENING     Status: None   Collection Time    04/10/13  8:18 PM      Result Value Range Status   MRSA by PCR NEGATIVE  NEGATIVE Final   Comment:            The GeneXpert MRSA Assay (FDA     approved for NASAL specimens     only), is one component of a     comprehensive MRSA colonization     surveillance program. It is not     intended to diagnose MRSA     infection nor to guide or     monitor treatment for     MRSA infections.     Studies:              All Imaging reviewed and is as per above notation   Scheduled Meds: . amiodarone  400 mg Oral TID  . diltiazem  240 mg Oral Daily  . feeding supplement (ENSURE COMPLETE)  237 mL Oral BID BM  . metoprolol succinate  50 mg Oral BID  . mirtazapine  15 mg Oral QHS  . pantoprazole  40 mg Oral Daily  . polyethylene glycol  17 g Oral Daily  . potassium chloride  40 mEq Oral Daily  . senna-docusate  2 tablet Oral BID  . Warfarin - Pharmacist Dosing Inpatient   Does not apply q1800   Continuous Infusions: . dextrose 5 % and 0.45% NaCl 75 mL (04/14/13 0109)     Assessment/Plan: Patient Active Problem List   Diagnosis Date Noted  . Hyposmolality and/or hyponatremia- Suspect given history of poor diet, this is likely secondary to euvolemic hyponatremia with salt wasting.  UOsm 367  Continue D5/ns 75 cc/hour.  Suspect this will not be treatable unless she increases solute in diet more.  Would consider free water restriction but then would be IVF dependant for salt.  Change to regular diet to give more salt.  Will consider Salt tablets as well 04/11/2013  . Failure to thrive syndrome, adult, likely secondary to multiple debilitating illnesses.  Get Calorie cnt to objectify intake. 04/11/2013  . Tissue necrosis of left arm-likely secondary to arm infiltration 2/2 to diltiazem in the past 04/11/2013  . Constipation, chronic-passed large stool 12/11.  Continue Bowel regimen Miralax 17 g daily, Senna-Docusate.  Add PRN lactulose 12/13  03/23/2013  . Atrial fibrillation with RVR-unclear how compliant patient is on medical therapy-Appreciate guidance from cardiology.   Amiodarone 400 mg tid-->200 bid on eventual d/c home, Diltiazem 240 mg, metoprolol 50 bid.  Appreciate Cardiology input 02/22/2013  . History of pulmonary embolism 2008-continue coumadin-therapeutic 02/22/2013  . Myalgia and myositis 02/22/2013  . Leukocytosis, unspecified-unclear etiology-outpatient workup  02/22/2013  . Thrombocytosis-likely secondary and chronic since 2014 November.? Possibility of Myelodysplasia-Hold work-up for now. 02/22/2013  . Anemia-chronic , borderline microcytic and likely secondary to anemia chronic disease 02/22/2013  . Diabetes-likely diet-controlled . Would not control aggressively at this age her stage, 62's to 100's 02/22/2013  . Protein-calorie malnutrition, severe-dietitian consult as outpatient  02/21/2013    Early Satiety-barium swallow 12/12=severe dysmotility. Unlikely will benefit from PEG tube, might consider short term [place].  TBD     Adult Failure to thrive in setting of multiple acute illneses-monitor.  Get nutritionist involved.  Palliative performance score [Karnofsky ~40].  Appreicate Palliative medicine input in advance     Compensated CHF Ef 60-65%-is actually Volume depleted.  Cont IVF 75cc/hr     Depression-Continue Mirtazipine.  Consider addition Megace, although no data to suppor tits use     H/o Breast cancer-s/p XRT 08/2006 & Lumpectomy                Code Status: Confirmed full code with family  Family Communication: NOne at bedisde this am.  WIll update later Disposition Plan: Step down for now, possibnle tele transfer in am-Probably SNF   Pleas Koch, MD  [facility] Pager [phone] 04/14/2013, 9:28 AM    LOS: 4 days

## 2013-04-15 LAB — RENAL FUNCTION PANEL
BUN: 7 mg/dL (ref 6–23)
Calcium: 7.9 mg/dL — ABNORMAL LOW (ref 8.4–10.5)
Creatinine, Ser: 0.72 mg/dL (ref 0.50–1.10)
GFR calc Af Amer: 86 mL/min — ABNORMAL LOW (ref >90)
Glucose, Bld: 100 mg/dL — ABNORMAL HIGH (ref 70–99)
Phosphorus: 2 mg/dL — ABNORMAL LOW (ref 2.3–4.6)
Potassium: 4.7 mEq/L (ref 3.5–5.1)
Sodium: 130 mEq/L — ABNORMAL LOW (ref 135–145)

## 2013-04-15 LAB — PROTIME-INR: INR: 2.15 — ABNORMAL HIGH (ref 0.00–1.49)

## 2013-04-15 MED ORDER — MEGESTROL ACETATE 400 MG/10ML PO SUSP
400.0000 mg | Freq: Two times a day (BID) | ORAL | Status: DC
  Administered 2013-04-15 – 2013-04-16 (×2): 400 mg via ORAL
  Filled 2013-04-15 (×3): qty 10

## 2013-04-15 MED ORDER — WARFARIN SODIUM 2.5 MG PO TABS
2.5000 mg | ORAL_TABLET | Freq: Once | ORAL | Status: AC
  Administered 2013-04-15: 2.5 mg via ORAL
  Filled 2013-04-15: qty 1

## 2013-04-15 NOTE — Progress Notes (Signed)
ANTICOAGULATION CONSULT NOTE - Follow Up  Pharmacy Consult for Warfarin Indication: atrial fibrillation  Allergies  Allergen Reactions  . Penicillins Nausea And Vomiting    "almost pass out"    Patient Measurements: Height: 5\' 4"  (162.6 cm) Weight: 116 lb 10 oz (52.9 kg) IBW/kg (Calculated) : 54.7  Vital Signs: Temp: 97.4 F (36.3 C) (12/14 0443) Temp src: Oral (12/14 0443) BP: 108/68 mmHg (12/14 0443) Pulse Rate: 102 (12/14 0443)  Labs:  Recent Labs  04/13/13 0350 04/13/13 1030 04/14/13 0347 04/15/13 0942  HGB  --  10.4* 8.7*  --   HCT  --  31.7* 26.7*  --   PLT  --  412* 330  --   LABPROT 27.7*  --  26.1* 23.3*  INR 2.69*  --  2.49* 2.15*  CREATININE  --  0.81 0.81 0.72    Estimated Creatinine Clearance: 40.6 ml/min (by C-G formula based on Cr of 0.72).   Medications:  Scheduled:  . amiodarone  400 mg Oral TID  . diltiazem  240 mg Oral Daily  . feeding supplement (ENSURE COMPLETE)  237 mL Oral BID BM  . metoprolol succinate  50 mg Oral BID  . mirtazapine  15 mg Oral QHS  . pantoprazole  40 mg Oral Daily  . polyethylene glycol  17 g Oral Daily  . potassium chloride  40 mEq Oral Daily  . senna-docusate  2 tablet Oral BID  . sodium chloride  10-40 mL Intracatheter Q12H  . Warfarin - Pharmacist Dosing Inpatient   Does not apply q1800   Infusions:  . dextrose 5 % and 0.45% NaCl 75 mL/hr at 04/15/13 0221   Assessment: 11 yoF admitted 12/9 with abdominal pain, constipation, Afib with RVR on chronic warfarin therapy, anemia, malnutrition.  INR on admission was supra-therapeutic. Pharmacy was consulted to monitor warfarin.  PTA warfarin: 2.5mg  daily except 5mg  on Monday and Wednesdays.  Last dose taken on 12/7 (dose held on 12/8 for INR 4.7)  INR remains in therapeutic range but continues to trend down.  Poor diet intake except tolerating Ensure Complete bid.   Note drug-drug interaction with Amiodarone(started 12/10): will increase serum concentrations of  warfarin as it reaches steady-state over the next month or so. Will need lower doses of warfarin.  (12/13) H/H, platelets trended down.  Goal of Therapy:  INR 2-3   Plan:   Give warfarin 2.5mg  x 1 today at noon to hopefully keep INR >2.   Pharmacy to f/u INR daily.  When discharged, will need close follow-up of INR.   Charolotte Eke, PharmD, pager [phone]. 04/15/2013,10:45 AM.

## 2013-04-15 NOTE — Progress Notes (Signed)
Lisa Nixon:[id number] DOB: [birth year] DOA: 04/10/2013 PCP: Pamella Pert, MD  Brief narrative: 79 y.o. female with history of A. fib on Coumadin, severe protein malnutrition, hypertension, iron deficiency anemia, GERD, chronic constipation who presented to [facility] ER was admitted and discharged from [facility] on 11/25 due to Afib with RVR, she was treated with Diltiazem gtt then, she has been having issues related to her HR and soft BP for 1-2 weeks.  She was seen by Dr. [name] 12/8, her blood pressure was soft then, diltiazem was discontinued.  Today her home health nurse went to check on her and found her heart rate to be in the 140s and blood pressure less than 90 systolic and hence her PCP was called and sent to the ER.  She has lost a lot of weight over the last few months-Daughter has been trying to give some Constiption meds-she doesn't eat enough to warrant enough stool per daughter  Because of continued Rapid Afib, patient placed initially on Amio gt, Diltiazem gtt and po metoprolol under expert guidance of Dr. Jacinto Halim, Cardiology  Her HR was more controlled as of 04/13/13, and she was tolerating diet a little better.  PAssed large stool 12/12 MOre oriented   Past medical history-As per Problem list Chart reviewed as below- Extensive-see below  Consultants:  Dr. Jacinto Halim Cardiology  Palliative Care 04/13/13  Procedures:   Picc line 04/13/13  Antibiotics:  none   Subjective    More alert per nursing. Stools yesterday with bowel regimen Didnt eat breakfast or lunch today Still doesn;t really wish to have an NG tube or PEG after me specifically asking her her wishes "i'll try to eat"   Objective    Interim History: none  Telemetry: Afib ranging between 90-120  Objective: Filed Vitals:   04/14/13 1439 04/14/13 2212 04/15/13 0443 04/15/13 1449  BP: 112/63 119/71 108/68 114/80  Pulse: 118 105 102 108  Temp: 98 F (36.7 C) 98.3 F (36.8 C) 97.4  F (36.3 C) 98.4 F (36.9 C)  TempSrc: Oral Oral Oral Oral  Resp: 16 16 18 16   Height:      Weight:   52.9 kg (116 lb 10 oz)   SpO2: 99% 98% 98% 100%    Intake/Output Summary (Last 24 hours) at 04/15/13 1736 Last data filed at 04/15/13 1450  Gross per 24 hour  Intake    275 ml  Output    650 ml  Net   -375 ml    Exam:  General: Alert frail temporal wasting Cardiovascular: no bruit, S1-S2? S3 left lower sternal edge Respiratory: Clinically clear no crackles Abdomen: Soft nontender nondistended surgical scars scaphoid abdomen noted, stool in diaper Skin Seen 12/13  Noted area of infiltration of left arm as above Neuro grossly intact moving all 4 extremities equally  Data Reviewed: Basic Metabolic Panel:  Recent Labs Lab 04/10/13 1643 04/11/13 0727 04/11/13 0730 04/12/13 0348 04/13/13 1030 04/14/13 0347 04/15/13 0942  NA 130*  --  130* 130* 129* 130* 130*  K 4.9  --  3.1* 3.8 3.5 3.7 4.7  CL 96  --  98 100 100 102 106  CO2 22  --  20 20 20 19  16*  GLUCOSE 107*  --  107* 104* 131* 97 100*  BUN 17  --  15 12 9 9 7   CREATININE 1.29*  --  1.19* 0.91 0.81 0.81 0.72  CALCIUM 9.0  --  8.3* 8.4 8.7 8.2* 7.9*  MG 1.4* 1.3*  --   --   --   --   --  PHOS  --   --   --   --   --   --  2.0*   Liver Function Tests:  Recent Labs Lab 04/10/13 1643 04/12/13 0348 04/14/13 0347 04/15/13 0942  AST 45* 36 24  --   ALT 11 8 10   --   ALKPHOS 69 75 69  --   BILITOT 0.6 0.3 0.5  --   PROT 6.5 5.7* 5.3*  --   ALBUMIN 1.8* 1.5* 1.3* 1.3*   No results found for this basename: LIPASE, AMYLASE,  in the last 168 hours No results found for this basename: AMMONIA,  in the last 168 hours CBC:  Recent Labs Lab 04/10/13 1643 04/11/13 0730 04/12/13 0348 04/13/13 1030 04/14/13 0347  WBC 13.9* 13.6* 17.1* 11.3* 11.2*  HGB 9.5* 8.8* 8.8* 10.4* 8.7*  HCT 29.2* 27.4* 27.4* 31.7* 26.7*  MCV 88.8 88.4 88.7 89.5 89.3  PLT 472* 439* 413* 412* 330   Cardiac Enzymes: No results  found for this basename: CKTOTAL, CKMB, CKMBINDEX, TROPONINI,  in the last 168 hours BNP: No components found with this basename: POCBNP,  CBG: No results found for this basename: GLUCAP,  in the last 168 hours  Recent Results (from the past 240 hour(s))  MRSA PCR SCREENING     Status: None   Collection Time    04/10/13  8:18 PM      Result Value Range Status   MRSA by PCR NEGATIVE  NEGATIVE Final   Comment:            The GeneXpert MRSA Assay (FDA     approved for NASAL specimens     only), is one component of a     comprehensive MRSA colonization     surveillance program. It is not     intended to diagnose MRSA     infection nor to guide or     monitor treatment for     MRSA infections.     Studies:              All Imaging reviewed and is as per above notation   Scheduled Meds: . amiodarone  400 mg Oral TID  . diltiazem  240 mg Oral Daily  . feeding supplement (ENSURE COMPLETE)  237 mL Oral BID BM  . megestrol  400 mg Oral BID  . metoprolol succinate  50 mg Oral BID  . mirtazapine  15 mg Oral QHS  . pantoprazole  40 mg Oral Daily  . polyethylene glycol  17 g Oral Daily  . potassium chloride  40 mEq Oral Daily  . senna-docusate  2 tablet Oral BID  . sodium chloride  10-40 mL Intracatheter Q12H  . Warfarin - Pharmacist Dosing Inpatient   Does not apply q1800   Continuous Infusions: . dextrose 5 % and 0.45% NaCl 75 mL/hr at 04/15/13 1629     Assessment/Plan: Patient Active Problem List   Diagnosis Date Noted  . Hyposmolality and/or hyponatremia- Suspect given history of poor diet, this is likely secondary to euvolemic hyponatremia with salt wasting.  UOsm 367  Continue D5/ns 75 cc/hour.  Suspect this will not be treatable unless she increases solute in diet more.  Would consider free water restriction but then would be IVF dependant for salt.  Change to regular diet to give more salt.  Will consider Salt tablets as well 04/11/2013  . Tissue necrosis of left  arm-likely secondary to arm infiltration 2/2 to diltiazem in the past 04/11/2013  .  Constipation, chronic-passed large stool 12/11.  Continue Bowel regimen Miralax 17 g daily, Senna-Docusate.  Add PRN lactulose 12/13 03/23/2013  . Atrial fibrillation with RVR-unclear how compliant patient is on medical therapy-Appreciate guidance from cardiology.   Amiodarone 400 mg tid-->200 bid on eventual d/c home, Diltiazem 240 mg, metoprolol 50 bid.  Appreciate Cardiology input 02/22/2013  . History of pulmonary embolism 2008-continue coumadin-therapeutic 02/22/2013  . Myalgia and myositis 02/22/2013  . Leukocytosis, unspecified-unclear etiology-outpatient workup  02/22/2013  . Thrombocytosis-likely secondary and chronic since 2014 November.? Possibility of Myelodysplasia-Hold work-up for now. 02/22/2013  . Anemia-chronic , borderline microcytic and likely secondary to anemia chronic disease 02/22/2013  . Diabetes-likely diet-controlled . Would not control aggressively at this age her stage, 45's to 100's 02/22/2013  . Protein-calorie malnutrition, severe-not eating.  refusing Long GI tube, PEG.  Unclear how to proceed 02/21/2013    Early Satiety-barium swallow 12/12=severe dysmotility. Unlikely will benefit from PEG tube, might consider short term [place].  TBD     Adult Failure to thrive in setting of multiple acute illneses-monitor.  likely secondary to multiple debilitating illnesses.  Get Calorie cnt to objectify intake.  Added Megace 400 tid 12/14-check pre-albumin in 3 days [conflicting studies, doesn't change outcome or overall albumin level].  Palliative performance score [Karnofsky ~40].  Appreciate Palliative medicine input     Compensated CHF Ef 60-65%-is actually Volume depleted.  Cont IVF 75cc/hr     Depression-Continue Mirtazipine.       H/o Breast cancer-s/p XRT 08/2006 & Lumpectomy      Code Status: Confirmed full code with family  Family Communication: Updated son at bedside Disposition  Plan: -Probably SNF 1-2 days   Pleas Koch, MD  [facility] Pager [phone] 04/15/2013, 5:36 PM    LOS: 5 days

## 2013-04-15 NOTE — Progress Notes (Signed)
MYSELF AND CO-WORKER LIOBA MADE AWARE IN REPORT THAT RIGHT ARM MIDLINE PLACED ON 04/14/13 FOR LAB DRAWS WILL NOT GIVE BLOOD RETURN. UPON ASSESSMENT THERE WAS BLOODY DRAINAGE NOTED UNDERNEATH DRESSING. ATTEMPTED TO DRAW LABS , BUT NO BLOOD RETURN, LINE WAS RETRACTED TO 3CM MARK AND ATTEMPTED TO DRAW LABS AGAIN BUT UNSUCCESSFUL, LINE FLUSHES EASILY BUT DOES APPEAR TO HAVE SMALL LEAK AT INSERTION SITE, STAFF RN MADE AWARE, EXPLAINED THIS LINE WILL NOT BE ABLE TO BE USED FOR LABS

## 2013-04-16 LAB — PROTIME-INR
INR: 2.11 — ABNORMAL HIGH (ref 0.00–1.49)
Prothrombin Time: 23 seconds — ABNORMAL HIGH (ref 11.6–15.2)

## 2013-04-16 MED ORDER — DILTIAZEM HCL ER COATED BEADS 240 MG PO CP24: 240.0000 mg | ORAL_CAPSULE | Freq: Every day | ORAL | Status: DC

## 2013-04-16 MED ORDER — AMIODARONE HCL 400 MG PO TABS: 200.0000 mg | ORAL_TABLET | Freq: Two times a day (BID) | ORAL | Status: AC

## 2013-04-16 MED ORDER — ENSURE COMPLETE PO LIQD: 237.0000 mL | Freq: Two times a day (BID) | ORAL | Status: DC

## 2013-04-16 MED ORDER — MEGESTROL ACETATE 400 MG/10ML PO SUSP: 400.0000 mg | Freq: Two times a day (BID) | ORAL | Status: AC

## 2013-04-16 MED ORDER — LACTULOSE 10 GM/15ML PO SOLN: 20.0000 g | Freq: Two times a day (BID) | ORAL | Status: AC | PRN

## 2013-04-16 MED ORDER — WARFARIN SODIUM 2.5 MG PO TABS
2.5000 mg | ORAL_TABLET | Freq: Once | ORAL | Status: DC
  Filled 2013-04-16: qty 1

## 2013-04-16 MED ORDER — OXYCODONE-ACETAMINOPHEN 5-325 MG PO TABS: 1.0000 | ORAL_TABLET | Freq: Four times a day (QID) | ORAL | Status: AC | PRN

## 2013-04-16 MED ORDER — METOPROLOL SUCCINATE ER 50 MG PO TB24: 50.0000 mg | ORAL_TABLET | Freq: Two times a day (BID) | ORAL | Status: AC

## 2013-04-16 NOTE — Progress Notes (Signed)
Report given to [facility] at [employer]. Setzer, Don Broach

## 2013-04-16 NOTE — Progress Notes (Signed)
Patient WU:[id number] Lisa Nixon      DOB: 1923/04/14      YNW:[id number]  Family agrees to meet me at 430 pm to try to solidify goals.  Thomasine Klutts L. Ladona Ridgel, MD MBA The Palliative Medicine Team at [facility] Phone: [phone] Pager: [phone]

## 2013-04-16 NOTE — Evaluation (Signed)
Occupational Therapy Evaluation Patient Details Name: Lisa Nixon MRN: [id number] DOB: 1922/10/10 Today's Date: 04/16/2013 Time: 4098-1191 OT Time Calculation (min): 36 min  OT Assessment / Plan / Recommendation History of present illness 79 yo female admitted with A fib with RVR, weakness, AMS.    Clinical Impression   Pt presents to OT with decreased I with ADL activity . Pt will need SNF rehab upon DC to increase I with ADL activity and return to [facility]    OT Assessment  Patient needs continued OT Services    Follow Up Recommendations  SNF       Equipment Recommendations  None recommended by OT       Frequency  Min 2X/week    Precautions / Restrictions Precautions Precautions: Fall Precaution Comments: incontinence Restrictions Weight Bearing Restrictions: No       ADL  Grooming: Set up Where Assessed - Grooming: Unsupported sitting Where Assessed - Upper Body Bathing: Unsupported sitting Lower Body Bathing: Moderate assistance Where Assessed - Lower Body Bathing: Supported sit to stand Upper Body Dressing: +1 Total assistance Where Assessed - Upper Body Dressing: Unsupported sitting Lower Body Dressing: Moderate assistance Where Assessed - Lower Body Dressing: Supported sit to Pharmacist, hospital: Moderate assistance;+1 Total assistance Toilet Transfer Method: Stand pivot;Sit to Barista: Materials engineer and Hygiene: Moderate assistance Where Assessed - Toileting Clothing Manipulation and Hygiene: Sit to stand from 3-in-1 or toilet    OT Diagnosis: Generalized weakness  OT Problem List: Decreased strength;Decreased activity tolerance OT Treatment Interventions: Self-care/ADL training;DME and/or AE instruction;Patient/family education   OT Goals(Current goals can be found in the care plan section) Acute Rehab OT Goals Patient Stated Goal: to get better  Visit Information  Last OT Received On:  04/16/13 Assistance Needed: +1 History of Present Illness: 79 yo female admitted with A fib with RVR, weakness, AMS.        Prior Functioning     Home Living Family/patient expects to be discharged to:: Private residence Available Help at Discharge: Family;Available 24 hours/day (unsure if has @$/7, spouse sickly.) Type of Home: House Home Access: Level entry Home Equipment: Cane - single point;Bedside commode;Shower seat;Walker - 2 wheels Additional Comments: Pt reports that her husband uses a  walker and she uses a cane.  Pt states that her husbaand was in hospital for hypoglycemia. Prior Function Level of Independence: Independent with assistive device(s) (was recently at [facility].)            Cognition  Cognition Arousal/Alertness: Awake/alert Behavior During Therapy: Anxious Overall Cognitive Status: Within Functional Limits for tasks assessed Area of Impairment: Safety/judgement Safety/Judgement: Decreased awareness of deficits;Decreased awareness of safety    Extremity/Trunk Assessment Upper Extremity Assessment Upper Extremity Assessment: Defer to OT evaluation Lower Extremity Assessment Lower Extremity Assessment: Generalized weakness Cervical / Trunk Assessment Cervical / Trunk Assessment: Normal     Mobility Bed Mobility Bed Mobility: Supine to Sit;Sit to Supine;Rolling Right;Rolling Left Rolling Right: 5: Supervision;With rail Rolling Left: 5: Supervision;With rail Supine to Sit: 4: Min assist Sit to Supine: 3: Mod assist Details for Bed Mobility Assistance: Increased time and mod encouragment for pt to put forth full effort.Assist for trunk and LEs. Total assist for hygiene, sheet/gown exchange once back in bed.  Transfers Sit to Stand: 3: Mod assist;From bed;From chair/3-in-1 Stand to Sit: 3: Mod assist;To bed;To chair/3-in-1 Details for Transfer Assistance: Assist to rise, stabilize, control descent, maneuver with walker. Stand pivot x 2, bed<.BSC,  increased  assistance for pivot back to bed-pt anxious, c/o not feeling well-would not stand at EOB long enough for toilet hygiene.            End of Session OT - End of Session Activity Tolerance: Patient limited by fatigue Patient left: in bed Nurse Communication: Mobility status  GO     Alba Cory 04/16/2013, 12:03 PM

## 2013-04-16 NOTE — Progress Notes (Signed)
Calorie Count Note  48 hour calorie count ordered.  Diet: Low Sodium, Heart Healthy Supplements: Ensure Complete BID  Breakfast: none Lunch: none Dinner: none Supplements: 350 kcal, 13 grams protein  Per Nursing notes pt is refusing most meals stating she just doesn't have an appetite. When pt is willing to eat she typically only takes a couple bites of food. Per chart pt is not interested in receiving a feeding tube; pt states she will try to eat better.   Total intake: 350 kcal (27% of minimum estimated needs)  13 grams protein (22% of minimum estimated needs)  Nutrition Dx: Inadequate oral intake related to poor appetite as evidenced by pt's report and PO <25% of breakfast; ongoing  Goal: Pt to meet >/= 90% of their estimated nutrition needs; not met  Intervention: Continue Ensure Complete BID Provide Ensure Pudding BID after meals Continue snack once daily Continue Magic Cup once daily Continue Megace  Ian Malkin RD, LDN Inpatient Clinical Dietitian Pager: [phone] After Hours Pager: [phone]

## 2013-04-16 NOTE — Evaluation (Signed)
Physical Therapy Evaluation Patient Details Name: Lisa Nixon MRN: [id number] DOB: May 28, 1922 Today's Date: 04/16/2013 Time: 8657-8469 PT Time Calculation (min): 37 min  PT Assessment / Plan / Recommendation History of Present Illness  79 yo female admitted with A fib with RVR, weakness, AMS.   Clinical Impression  On eval, pt required Mod assist for mobility-able to perform stand pivot with walker x 2, bed<>BSC. Demonstrates general weakness, decreased activity tolerance. Pt also c/o dizziness and being cold throughout session. Did not note any nystagmus during session. Pt is agreeable to rehab.     PT Assessment  Patient needs continued PT services    Follow Up Recommendations  SNF    Does the patient have the potential to tolerate intense rehabilitation      Barriers to Discharge        Equipment Recommendations  None recommended by PT    Recommendations for Other Services OT consult   Frequency Min 3X/week    Precautions / Restrictions Precautions Precautions: Fall Precaution Comments: incontinence Restrictions Weight Bearing Restrictions: No   Pertinent Vitals/Pain Back pain-unrated      Mobility  Bed Mobility Bed Mobility: Supine to Sit;Sit to Supine;Rolling Right;Rolling Left Rolling Right: 5: Supervision;With rail Rolling Left: 5: Supervision;With rail Supine to Sit: 4: Min assist Sit to Supine: 3: Mod assist Details for Bed Mobility Assistance: Increased time and mod encouragment for pt to put forth full effort.Assist for trunk and LEs. Total assist for hygiene, sheet/gown exchange once back in bed.  Transfers Transfers: Sit to Stand;Stand to Sit;Stand Pivot Transfers Sit to Stand: 3: Mod assist;From bed;From chair/3-in-1 Stand to Sit: 3: Mod assist;To bed;To chair/3-in-1 Stand Pivot Transfers: 3: Mod assist Details for Transfer Assistance: Assist to rise, stabilize, control descent, maneuver with walker. Stand pivot x 2, bed<.BSC, increased  assistance for pivot back to bed-pt anxious, c/o not feeling well-would not stand at EOB long enough for toilet hygiene.  Ambulation/Gait Ambulation/Gait Assistance: Not tested (comment) Ambulation/Gait Assistance Details: Pt declined to attempt.     Exercises     PT Diagnosis: Difficulty walking;Generalized weakness  PT Problem List: Decreased strength;Decreased activity tolerance;Decreased balance;Decreased mobility;Decreased knowledge of use of DME;Decreased safety awareness PT Treatment Interventions: DME instruction;Gait training;Functional mobility training;Therapeutic activities;Therapeutic exercise;Patient/family education     PT Goals(Current goals can be found in the care plan section) Acute Rehab PT Goals Patient Stated Goal: to get better PT Goal Formulation: With patient Time For Goal Achievement: 04/30/13 Potential to Achieve Goals: Good  Visit Information  Last PT Received On: 04/16/13 Assistance Needed: +1 History of Present Illness: 79 yo female admitted with A fib with RVR, weakness, AMS.        Prior Functioning  Home Living Family/patient expects to be discharged to:: Private residence Available Help at Discharge: Family;Available 24 hours/day (unsure if has @$/7, spouse sickly.) Type of Home: House Home Access: Level entry Home Equipment: Cane - single point;Bedside commode;Shower seat;Walker - 2 wheels Additional Comments: Pt reports that her husband uses a  walker and she uses a cane.  Pt states that her husbaand was in hospital for hypoglycemia. Prior Function Level of Independence: Independent with assistive device(s) (was recently at [facility]    Cognition  Cognition Arousal/Alertness: Awake/alert Behavior During Therapy: Anxious Overall Cognitive Status: Within Functional Limits for tasks assessed Area of Impairment: Safety/judgement Safety/Judgement: Decreased awareness of deficits;Decreased awareness of safety    Extremity/Trunk Assessment Upper  Extremity Assessment Upper Extremity Assessment: Defer to OT evaluation Lower Extremity Assessment Lower Extremity  Assessment: Generalized weakness Cervical / Trunk Assessment Cervical / Trunk Assessment: Normal   Balance    End of Session PT - End of Session Activity Tolerance: Patient limited by fatigue Patient left: in bed;with call bell/phone within reach;with bed alarm set  GP     Rebeca Alert, MPT Pager: [phone]

## 2013-04-16 NOTE — Progress Notes (Signed)
ANTICOAGULATION CONSULT NOTE - Follow Up  Pharmacy Consult for Warfarin Indication: atrial fibrillation  Allergies  Allergen Reactions  . Penicillins Nausea And Vomiting    "almost pass out"    Patient Measurements: Height: 5\' 4"  (162.6 cm) Weight: 119 lb 4.8 oz (54.114 kg) IBW/kg (Calculated) : 54.7  Vital Signs: Temp: 97.6 F (36.4 C) (12/15 0651) Temp src: Oral (12/15 0651) BP: 122/81 mmHg (12/15 0651) Pulse Rate: 97 (12/15 0651)  Labs:  Recent Labs  04/13/13 1030 04/14/13 0347 04/15/13 0942 04/16/13 0440  HGB 10.4* 8.7*  --   --   HCT 31.7* 26.7*  --   --   PLT 412* 330  --   --   LABPROT  --  26.1* 23.3* 23.0*  INR  --  2.49* 2.15* 2.11*  CREATININE 0.81 0.81 0.72  --     Estimated Creatinine Clearance: 41.5 ml/min (by C-G formula based on Cr of 0.72).   Medications:  Scheduled:  . amiodarone  400 mg Oral TID  . diltiazem  240 mg Oral Daily  . feeding supplement (ENSURE COMPLETE)  237 mL Oral BID BM  . megestrol  400 mg Oral BID  . metoprolol succinate  50 mg Oral BID  . mirtazapine  15 mg Oral QHS  . pantoprazole  40 mg Oral Daily  . polyethylene glycol  17 g Oral Daily  . potassium chloride  40 mEq Oral Daily  . senna-docusate  2 tablet Oral BID  . sodium chloride  10-40 mL Intracatheter Q12H  . Warfarin - Pharmacist Dosing Inpatient   Does not apply q1800   Infusions:  . dextrose 5 % and 0.45% NaCl 75 mL/hr at 04/16/13 1610   Assessment: 56 yoF admitted 12/9 with abdominal pain, constipation, Afib with RVR on chronic warfarin therapy, anemia, malnutrition.  INR on admission was supra-therapeutic. Pharmacy was consulted to monitor warfarin.  PTA warfarin: 2.5mg  daily except 5mg  on Monday and Wednesdays.  Last dose taken on 12/7 (dose held on 12/8 for INR 4.7)  INR = 2.11 (remains in therapeutic range but continues to trend down)  Poor diet intake except tolerating Ensure Complete bid.   Note drug-drug interaction with Amiodarone(started  12/10): will increase serum concentrations of warfarin as it reaches steady-state over the next month or so. Will need lower doses of warfarin.  (12/13) H/H, platelets trended down.  Goal of Therapy:  INR 2-3   Plan:   Give warfarin 2.5mg  x 1 today.   Pharmacy to f/u INR daily.  When discharged, will need close follow-up of INR (newly started amiodarone).   Juliette Alcide, PharmD, BCPS.   Pager: [phone] 04/16/2013,9:00 AM.

## 2013-04-16 NOTE — Discharge Summary (Addendum)
Physician Discharge Summary  Lisa Nixon WUJ:[id number] DOB: [birth year] DOA: 04/10/2013  PCP: Pamella Pert, MD  Admit date: 04/10/2013 Discharge date: 04/16/2013  Time spent: 40 minutes  Recommendations for Outpatient Follow-up:  1. Need INR checked soon as is on amiodarone 2. Needs TSH LFTs prealbumin in about one week 3. Continue Megace 4. Needs goals of care and palliative to follow at nursing home should patient fail to thrive 5. Patient expressly did not want artificial feeding tube or even NG tube    Discharge Diagnoses:  Principal Problem:   Atrial fibrillation with RVR Active Problems:   Protein-calorie malnutrition, severe   History of pulmonary embolism   Thrombocytosis   Anemia   Hypokalemia   Abdominal  pain, other specified site   Constipation, chronic   Hyposmolality and/or hyponatremia   Failure to thrive syndrome, adult, likely secondary to multiple debilitating illnesses    tissue necrosis of left arm   Myelodysplasia?? with leukocytosis and thrombocytosis   Early satiety   Adult failure to thrive   Discharge Condition: Fair to guarded  Diet recommendation: Liberalized whatever she wants she to eat  Filed Weights   04/14/13 1125 04/15/13 0443 04/16/13 0651  Weight: 50.576 kg (111 lb 8 oz) 52.9 kg (116 lb 10 oz) 54.114 kg (119 lb 4.8 oz)    History of present illness:  79 y.o. female with history of A. fib on Coumadin, severe protein malnutrition, hypertension, iron deficiency anemia, GERD, chronic constipation who presented to [facility] ER was admitted and discharged from [facility] on 11/25 due to Afib with RVR, she was treated with Diltiazem gtt then, she has been having issues related to her HR and soft BP for 1-2 weeks.  She was seen by Dr. [name] 12/8, her blood pressure was soft then, diltiazem was discontinued.  Today her home health nurse went to check on her and found her heart rate to be in the 140s and blood pressure less than 90  systolic and hence her PCP was called and sent to the ER.  She has lost a lot of weight over the last few months-Daughter has been trying to give some Constiption meds-she doesn't eat enough to warrant enough stool per daughter  Because of continued Rapid Afib, patient placed initially on Amio gt, Diltiazem gtt and po metoprolol under expert guidance of Dr. Jacinto Halim, Cardiology  Her HR was more controlled as of 04/13/13, and she was tolerating diet a little better. PAssed large stool 12/12  More oriented   Hospital Course:   Patient Active Problem List    Diagnosis  Date Noted   .  Hyposmolality and/or hyponatremia- Suspect given history of poor diet, this is likely secondary to euvolemic hyponatremia with salt wasting. UOsm 367 Continue D5/ns 75 cc/hour. Suspect this will not be treatable unless she increases solute in diet more. Would consider free water restriction but then would be IVF dependant for salt. Change to regular diet to give more salt. Please consider use of salt tablets should she become hyponatremic again  04/11/2013   .  Tissue necrosis of left arm-likely secondary to arm infiltration 2/2 to diltiazem in the past-see diagram below for latest idea as to how the wound looks  04/11/2013   .  Constipation, chronic-passed large stool 12/11. Continue Bowel regimen Miralax 17 g daily, Senna-Docusate. Add PRN lactulose 12/13-she passes stool best on when necessary lactulose added to the former to medications  03/23/2013   .  Atrial fibrillation with RVR-unclear how compliant  patient is on medical therapy-Appreciate guidance from cardiology. Amiodarone 400 mg tid-->200 bid on eventual d/c home, Diltiazem 240 mg, metoprolol 50 bid. Appreciate Cardiology input  02/22/2013   .  History of pulmonary embolism 2008-continue coumadin-therapeutic  02/22/2013   .  Myalgia and myositis  02/22/2013   .  Leukocytosis, unspecified-unclear etiology-outpatient workup  02/22/2013   .  Thrombocytosis-likely  secondary and chronic since 2014 November.? Possibility of Myelodysplasia-Hold work-up for now.  02/22/2013   .  Anemia-chronic , borderline microcytic and likely secondary to anemia chronic disease  02/22/2013   .  Diabetes-likely diet-controlled . Would not control aggressively at this age her stage, 81's to 100's  02/22/2013   .  Protein-calorie malnutrition, severe-not eating. refusing Long GI tube, PEG. Unclear how to proceed-palliative care was consulted-patient has expressly detailed that she would does not really want to have any feeding tubes placed this will need to be delineated as an outpatient  02/21/2013    Early Satiety-barium swallow 12/12=severe dysmotility. Unlikely will benefit from PEG tube-see above     Adult Failure to thrive in setting of multiple acute illneses-monitor. likely secondary to multiple debilitating illnesses. Get Calorie cnt to objectify intake--patient not eating enough to get a calorie count. Added Megace 400 tid 12/14-check pre-albumin in 3 days [conflicting studies, doesn't change outcome or overall albumin level]. Palliative performance score [Karnofsky ~40]. Appreciate Palliative medicine input     Compensated CHF Ef 60-65%-is actually Volume depleted. Cont IVF 75cc/hr     Depression-Continue Mirtazipine.     H/o Breast cancer-s/p XRT 08/2006 & Lumpectomy      Consultants:  Dr. Jacinto Halim Cardiology  Palliative Care 04/13/13 Procedures:  Picc line 04/13/13 Upper barium swallow Antibiotics:  none   Discharge Exam: Filed Vitals:   04/16/13 0651  BP: 122/81  Pulse: 97  Temp: 97.6 F (36.4 C)  Resp: 18    General: Alert pleasant oriented no apparent distress, EOMI Cardiovascular: S1-S2 no murmur rub or gallop Respiratory: CTA b      Discharge Instructions  Discharge Orders   Future Orders Complete By Expires   Diet - low sodium heart healthy  As directed    Increase activity slowly  As directed        Medication List    STOP taking  these medications       metoprolol 50 MG tablet  Commonly known as:  LOPRESSOR     oxyCODONE-acetaminophen 7.5-325 MG per tablet  Commonly known as:  PERCOCET  Replaced by:  oxyCODONE-acetaminophen 5-325 MG per tablet     potassium chloride SA 20 MEQ tablet  Commonly known as:  K-DUR,KLOR-CON      TAKE these medications       amiodarone 400 MG tablet  Commonly known as:  PACERONE  Take 0.5 tablets (200 mg total) by mouth 2 (two) times daily.     diltiazem 240 MG 24 hr capsule  Commonly known as:  CARDIZEM CD  Take 1 capsule (240 mg total) by mouth daily.     feeding supplement (ENSURE COMPLETE) Liqd  Take 237 mLs by mouth 2 (two) times daily between meals.     ENSURE PLUS Liqd  Take 237 mLs by mouth 2 (two) times daily between meals.     INTEGRA PO  Take 1 capsule by mouth daily.     lactulose 10 GM/15ML solution  Commonly known as:  CHRONULAC  Take 30 mLs (20 g total) by mouth 2 (two) times daily as needed for mild  constipation.     megestrol 400 MG/10ML suspension  Commonly known as:  MEGACE  Take 10 mLs (400 mg total) by mouth 2 (two) times daily.     metoprolol succinate 50 MG 24 hr tablet  Commonly known as:  TOPROL-XL  Take 1 tablet (50 mg total) by mouth 2 (two) times daily. Take with or immediately following a meal.     mirtazapine 15 MG tablet  Commonly known as:  REMERON  Take 15 mg by mouth at bedtime.     oxyCODONE-acetaminophen 5-325 MG per tablet  Commonly known as:  PERCOCET/ROXICET  Take 1 tablet by mouth every 6 (six) hours as needed for moderate pain.     pantoprazole 40 MG tablet  Commonly known as:  PROTONIX  Take 40 mg by mouth daily.     polyethylene glycol packet  Commonly known as:  MIRALAX / GLYCOLAX  Take 17 g by mouth daily.     senna-docusate 8.6-50 MG per tablet  Commonly known as:  Senokot-S  Take 2 tablets by mouth 2 (two) times daily.     silver sulfADIAZINE 1 % cream  Commonly known as:  SILVADENE  Apply topically 2  (two) times daily.     vitamin B-12 1000 MCG tablet  Commonly known as:  CYANOCOBALAMIN  Take 1,000 mcg by mouth daily.     warfarin 5 MG tablet  Commonly known as:  COUMADIN  Take 0.5-1 tablets (2.5-5 mg total) by mouth daily. Skip today's dose then take 5mg  on Mondays and Wednesdays and takes 2.5mg  on all other days close outpatient INR monitoring at nursing home       Allergies  Allergen Reactions  . Penicillins Nausea And Vomiting    "almost pass out"      The results of significant diagnostics from this hospitalization (including imaging, microbiology, ancillary and laboratory) are listed below for reference.    Significant Diagnostic Studies: Ct Abdomen Pelvis Wo Contrast  03/23/2013   CLINICAL DATA:  Abdomen pain, no IV access, hypertension, breast cancer, hernia repair  EXAM: CT ABDOMEN AND PELVIS WITHOUT CONTRAST  TECHNIQUE: Multidetector CT imaging of the abdomen and pelvis was performed following the standard protocol without intravenous contrast.  COMPARISON:  02/15/2013  FINDINGS: There is small right and smaller left pleural effusion. These are new when compared to the prior study. Lung bases are otherwise clear except for mild atelectasis. The heart is enlarged.  There is evidence of hiatal hernia repair. There is a nonobstructive bowel gas pattern. There is diverticulosis of the distal descending and sigmoid colon. No diverticulitis appreciated. There is fecal distention of the rectum to a maximal diameter of 7 cm.  In the non contrasted state, the liver, gallbladder, spleen, pancreas, and adrenal glands are normal. The kidneys are somewhat atrophic but otherwise normal. The aorta is extensively calcified. There is a small volume of free fluid in the pelvis. The appendix appears normal. The bladder is normal. A posterior fundus mass in the uterus measuring about 3 cm is likely a fibroid. The uterus appears somewhat enlarged consistent with fibroid involvement.  There is a  mild T12 compression deformity. This is not significantly different from the prior study.  IMPRESSION: Fluid overload with pleural effusions and small volume ascites.  Fecal impaction in the rectum.  Stable T12 compression deformity.   Electronically Signed   By: Esperanza Heir M.D.   On: 03/23/2013 16:00   Dg Esophagus  04/13/2013   CLINICAL DATA:  Early satiety.  Personal history of fundoplication.  EXAM: ESOPHOGRAM/BARIUM SWALLOW  TECHNIQUE: Single contrast examination was performed using  thin barium.  COMPARISON:  None.  FLUOROSCOPY TIME:  5 min 24 seconds  FINDINGS: Esophageal stasis was profound. Tertiary contractions were observed. There is no hiatal hernia. No stricture is identified. The gastroesophageal junction opened to approximately 1 cm diameter. 13 mm barium tablet challenge was performed. The tablet showed stasis in the distal thoracic esophagus and was never seen to pass. This appeared predominantly due to poor esophageal contraction and the marked stasis. Fluids did pass into the stomach under direct observation. Surgical clips noted around the gastroesophageal junction compatible with fundoplication. No reflux was observed. No mass lesion identified.  IMPRESSION: Nonspecific esophageal dysmotility with severe esophageal stasis. Postoperative changes of fundoplication. Gastroesophageal junction was observed open only to about 1 cm.   Electronically Signed   By: Andreas Newport M.D.   On: 04/13/2013 15:11   Dg Chest Portable 1 View  04/10/2013   CLINICAL DATA:  Shortness of breath, weakness, atrial fibrillation, hypertension, breast cancer  EXAM: PORTABLE CHEST - 1 VIEW  COMPARISON:  Portable exam 1644 hr compared to 02/18/2013  FINDINGS: Enlargement of cardiac silhouette.  Atherosclerotic calcification aorta.  Pulmonary vascular congestion.  Mediastinal contour stable.  Emphysematous and bronchitic changes consistent with COPD.  Minimal diffuse chronic interstitial prominence.  No definite  acute infiltrate, pleural effusion or pneumothorax.  Bones appear demineralized.  IMPRESSION: Enlargement of cardiac silhouette with pulmonary vascular congestion.  COPD changes.  No acute abnormalities.   Electronically Signed   By: Ulyses Southward M.D.   On: 04/10/2013 17:18    Microbiology: Recent Results (from the past 240 hour(s))  MRSA PCR SCREENING     Status: None   Collection Time    04/10/13  8:18 PM      Result Value Range Status   MRSA by PCR NEGATIVE  NEGATIVE Final   Comment:            The GeneXpert MRSA Assay (FDA     approved for NASAL specimens     only), is one component of a     comprehensive MRSA colonization     surveillance program. It is not     intended to diagnose MRSA     infection nor to guide or     monitor treatment for     MRSA infections.     Labs: Basic Metabolic Panel:  Recent Labs Lab 04/10/13 1643 04/11/13 0727 04/11/13 0730 04/12/13 0348 04/13/13 1030 04/14/13 0347 04/15/13 0942  NA 130*  --  130* 130* 129* 130* 130*  K 4.9  --  3.1* 3.8 3.5 3.7 4.7  CL 96  --  98 100 100 102 106  CO2 22  --  20 20 20 19  16*  GLUCOSE 107*  --  107* 104* 131* 97 100*  BUN 17  --  15 12 9 9 7   CREATININE 1.29*  --  1.19* 0.91 0.81 0.81 0.72  CALCIUM 9.0  --  8.3* 8.4 8.7 8.2* 7.9*  MG 1.4* 1.3*  --   --   --   --   --   PHOS  --   --   --   --   --   --  2.0*   Liver Function Tests:  Recent Labs Lab 04/10/13 1643 04/12/13 0348 04/14/13 0347 04/15/13 0942  AST 45* 36 24  --   ALT 11 8 10   --   Jolly Mango  69 75 69  --   BILITOT 0.6 0.3 0.5  --   PROT 6.5 5.7* 5.3*  --   ALBUMIN 1.8* 1.5* 1.3* 1.3*   No results found for this basename: LIPASE, AMYLASE,  in the last 168 hours No results found for this basename: AMMONIA,  in the last 168 hours CBC:  Recent Labs Lab 04/10/13 1643 04/11/13 0730 04/12/13 0348 04/13/13 1030 04/14/13 0347  WBC 13.9* 13.6* 17.1* 11.3* 11.2*  HGB 9.5* 8.8* 8.8* 10.4* 8.7*  HCT 29.2* 27.4* 27.4* 31.7* 26.7*  MCV  88.8 88.4 88.7 89.5 89.3  PLT 472* 439* 413* 412* 330   Cardiac Enzymes: No results found for this basename: CKTOTAL, CKMB, CKMBINDEX, TROPONINI,  in the last 168 hours BNP: BNP (last 3 results)  Recent Labs  02/18/13 0430 04/10/13 1643  PROBNP 16447.0* 15766.0*   CBG: No results found for this basename: GLUCAP,  in the last 168 hours     Signed:  Rhetta Mura  [facility] 04/16/2013, 9:59 AM

## 2013-04-16 NOTE — Progress Notes (Signed)
Patient is set to discharge to [facility] today. Patient's husband & daughter, Britta Mccreedy made aware. Discharge packet given to RN, Connye Burkitt. [facility] scheduled for 12:30p pickup (Service Request Id: [id number]).   Clinical Social Work Department CLINICAL SOCIAL WORK PLACEMENT NOTE 04/16/2013  Patient:  Lisa Nixon, Lisa Nixon  Account Number:  [id number] Admit date:  04/10/2013  Clinical Social Worker:  Jacelyn Grip  Date/time:  04/12/2013 02:58 PM  Clinical Social Work is seeking post-discharge placement for this patient at the following level of care:   SKILLED NURSING   (*CSW will update this form in Epic as items are completed)   04/12/2013  Patient/family provided with [facility] Department of Clinical Social Work's list of facilities offering this level of care within the geographic area requested by the patient (or if unable, by the patient's family).  04/12/2013  Patient/family informed of their freedom to choose among providers that offer the needed level of care, that participate in Medicare, Medicaid or managed care program needed by the patient, have an available bed and are willing to accept the patient.  04/12/2013  Patient/family informed of [facility]' ownership interest in [facility], as well as of the fact that they are under no obligation to receive care at this facility.  PASARR submitted to EDS on 04/12/2013 PASARR number received from EDS on 04/12/2013  FL2 transmitted to all facilities in geographic area requested by pt/family on  04/12/2013 FL2 transmitted to all facilities within larger geographic area on   Patient informed that his/her managed care company has contracts with or will negotiate with  certain facilities, including the following:     Patient/family informed of bed offers received:  04/11/2013 Patient chooses bed at [facility] Physician recommends and patient chooses bed at    Patient to be transferred to [facility] on  04/16/2013 Patient to be transferred to facility by PT[facility]  The following physician request were entered in Epic:   Additional Comments:   Unice Bailey, LCSW [facility] Clinical Social Worker cell #: [phone]

## 2013-05-30 ENCOUNTER — Other Ambulatory Visit: Payer: Self-pay | Admitting: Radiology

## 2013-05-30 ENCOUNTER — Other Ambulatory Visit (HOSPITAL_COMMUNITY): Payer: Self-pay | Admitting: Internal Medicine

## 2013-05-30 DIAGNOSIS — IMO0002 Reserved for concepts with insufficient information to code with codable children: Secondary | ICD-10-CM

## 2013-05-31 ENCOUNTER — Encounter (HOSPITAL_COMMUNITY): Payer: Self-pay | Admitting: Pharmacy Technician

## 2013-05-31 ENCOUNTER — Ambulatory Visit (HOSPITAL_COMMUNITY): Admission: RE | Admit: 2013-05-31 | Payer: Medicare FFS | Source: Ambulatory Visit

## 2013-06-01 ENCOUNTER — Ambulatory Visit (HOSPITAL_COMMUNITY)
Admission: RE | Admit: 2013-06-01 | Discharge: 2013-06-01 | Disposition: A | Discharge status: Home / Self Care | Payer: Medicare FFS | Source: Ambulatory Visit | Attending: Internal Medicine | Admitting: Internal Medicine

## 2013-06-01 ENCOUNTER — Encounter (HOSPITAL_COMMUNITY): Payer: Self-pay

## 2013-06-01 DIAGNOSIS — R1310 Dysphagia, unspecified: Secondary | ICD-10-CM | POA: Insufficient documentation

## 2013-06-01 DIAGNOSIS — R627 Adult failure to thrive: Secondary | ICD-10-CM | POA: Insufficient documentation

## 2013-06-01 DIAGNOSIS — E785 Hyperlipidemia, unspecified: Secondary | ICD-10-CM | POA: Insufficient documentation

## 2013-06-01 DIAGNOSIS — Z853 Personal history of malignant neoplasm of breast: Secondary | ICD-10-CM | POA: Insufficient documentation

## 2013-06-01 DIAGNOSIS — F0390 Unspecified dementia without behavioral disturbance: Secondary | ICD-10-CM | POA: Insufficient documentation

## 2013-06-01 DIAGNOSIS — I4891 Unspecified atrial fibrillation: Secondary | ICD-10-CM | POA: Insufficient documentation

## 2013-06-01 DIAGNOSIS — K224 Dyskinesia of esophagus: Secondary | ICD-10-CM | POA: Insufficient documentation

## 2013-06-01 DIAGNOSIS — E43 Unspecified severe protein-calorie malnutrition: Secondary | ICD-10-CM | POA: Insufficient documentation

## 2013-06-01 DIAGNOSIS — I252 Old myocardial infarction: Secondary | ICD-10-CM | POA: Insufficient documentation

## 2013-06-01 DIAGNOSIS — K219 Gastro-esophageal reflux disease without esophagitis: Secondary | ICD-10-CM | POA: Insufficient documentation

## 2013-06-01 DIAGNOSIS — IMO0002 Reserved for concepts with insufficient information to code with codable children: Secondary | ICD-10-CM

## 2013-06-01 DIAGNOSIS — Z7901 Long term (current) use of anticoagulants: Secondary | ICD-10-CM | POA: Insufficient documentation

## 2013-06-01 DIAGNOSIS — Z79899 Other long term (current) drug therapy: Secondary | ICD-10-CM | POA: Insufficient documentation

## 2013-06-01 DIAGNOSIS — I10 Essential (primary) hypertension: Secondary | ICD-10-CM | POA: Insufficient documentation

## 2013-06-01 HISTORY — DX: Unspecified dementia without behavioral disturbance: F03.90

## 2013-06-01 HISTORY — DX: Acute myocardial infarction, unspecified: I21.9

## 2013-06-01 LAB — CBC
HEMATOCRIT: 28.5 % — AB (ref 36.0–46.0)
Hemoglobin: 8.9 g/dL — ABNORMAL LOW (ref 12.0–15.0)
MCH: 28.2 pg (ref 26.0–34.0)
MCHC: 31.2 g/dL (ref 30.0–36.0)
MCV: 90.2 fL (ref 78.0–100.0)
Platelets: 262 10*3/uL (ref 150–400)
RBC: 3.16 MIL/uL — ABNORMAL LOW (ref 3.87–5.11)
RDW: 18.9 % — ABNORMAL HIGH (ref 11.5–15.5)
WBC: 13 10*3/uL — ABNORMAL HIGH (ref 4.0–10.5)

## 2013-06-01 LAB — BASIC METABOLIC PANEL
BUN: 32 mg/dL — AB (ref 6–23)
CHLORIDE: 118 meq/L — AB (ref 96–112)
CO2: 21 mEq/L (ref 19–32)
Calcium: 10.8 mg/dL — ABNORMAL HIGH (ref 8.4–10.5)
Creatinine, Ser: 1.86 mg/dL — ABNORMAL HIGH (ref 0.50–1.10)
GFR, EST AFRICAN AMERICAN: 27 mL/min — AB (ref >90)
GFR, EST NON AFRICAN AMERICAN: 23 mL/min — AB (ref >90)
Glucose, Bld: 67 mg/dL — ABNORMAL LOW (ref 70–99)
POTASSIUM: 4.1 meq/L (ref 3.7–5.3)
SODIUM: 153 meq/L — AB (ref 137–147)

## 2013-06-01 LAB — PROTIME-INR
INR: 1.55 — AB (ref 0.00–1.49)
Prothrombin Time: 18.2 seconds — ABNORMAL HIGH (ref 11.6–15.2)

## 2013-06-01 LAB — APTT: aPTT: 36 seconds (ref 24–37)

## 2013-06-01 MED ORDER — HEPARIN SOD (PORK) LOCK FLUSH 100 UNIT/ML IV SOLN
INTRAVENOUS | Status: AC
  Filled 2013-06-01: qty 5

## 2013-06-01 MED ORDER — MIDAZOLAM HCL 2 MG/2ML IJ SOLN
INTRAMUSCULAR | Status: AC | PRN
  Administered 2013-06-01: .25 mg via INTRAVENOUS

## 2013-06-01 MED ORDER — GLUCAGON HCL (RDNA) 1 MG IJ SOLR
INTRAMUSCULAR | Status: AC
  Filled 2013-06-01: qty 1

## 2013-06-01 MED ORDER — FENTANYL CITRATE 0.05 MG/ML IJ SOLN
INTRAMUSCULAR | Status: AC | PRN
  Administered 2013-06-01: 25 ug via INTRAVENOUS

## 2013-06-01 MED ORDER — LIDOCAINE-EPINEPHRINE (PF) 2 %-1:200000 IJ SOLN
INTRAMUSCULAR | Status: AC
  Filled 2013-06-01: qty 20

## 2013-06-01 MED ORDER — FENTANYL CITRATE 0.05 MG/ML IJ SOLN
INTRAMUSCULAR | Status: AC
  Filled 2013-06-01: qty 6

## 2013-06-01 MED ORDER — GLUCAGON HCL (RDNA) 1 MG IJ SOLR
INTRAMUSCULAR | Status: AC | PRN
  Administered 2013-06-01: 1 mg via INTRAVENOUS

## 2013-06-01 MED ORDER — SODIUM CHLORIDE 0.9 % IV SOLN
Freq: Once | INTRAVENOUS | Status: AC
  Administered 2013-06-01: 11:00:00 via INTRAVENOUS

## 2013-06-01 MED ORDER — VANCOMYCIN HCL IN DEXTROSE 1-5 GM/200ML-% IV SOLN
1000.0000 mg | Freq: Once | INTRAVENOUS | Status: AC
  Administered 2013-06-01: 1000 mg via INTRAVENOUS
  Filled 2013-06-01: qty 200

## 2013-06-01 MED ORDER — MIDAZOLAM HCL 2 MG/2ML IJ SOLN
INTRAMUSCULAR | Status: AC
  Filled 2013-06-01: qty 6

## 2013-06-01 MED ORDER — MIDAZOLAM HCL 2 MG/2ML IJ SOLN
INTRAMUSCULAR | Status: AC
  Filled 2013-06-01: qty 2

## 2013-06-01 NOTE — H&P (Signed)
Lisa Nixon is an 80 y.o. female.   Chief Complaint: failure to thrive, malnutrition HPI: Patient with history of severe PCM, FTT, afib on coumadin,  dementia, esophageal dysmotility/prior hiatal hernia repair/?fundoplication presents today for percutaneous gastrostomy tube placement.   Past Medical History  Diagnosis Date  . Atrial fibrillation   . Hyperlipemia   . GERD (gastroesophageal reflux disease)   . Hypertension   . Breast cancer   . Angina at rest   . Myocardial infarction 2008    Dr Einar Gip  . Dementia oct 2014    memory issues since oct 2014    Past Surgical History  Procedure Laterality Date  . Breast surgery    . Lymphadenectomy    . Hiatal hernia repair    . Cesarean section      No family history on file. Social History:  reports that she has never smoked. She does not have any smokeless tobacco history on file. She reports that she does not drink alcohol or use illicit drugs.  Allergies:  Allergies  Allergen Reactions  . Penicillins Nausea And Vomiting    "almost pass out"    Current outpatient prescriptions:amiodarone (PACERONE) 400 MG tablet, Take 0.5 tablets (200 mg total) by mouth 2 (two) times daily., Disp: 60 tablet, Rfl: 0;  cholecalciferol (VITAMIN D) 1000 UNITS tablet, Take 1,000 Units by mouth daily., Disp: , Rfl: ;  diltiazem (DILACOR XR) 240 MG 24 hr capsule, Take 240 mg by mouth every morning., Disp: , Rfl:  ENSURE PLUS (ENSURE PLUS) LIQD, Take 237 mLs by mouth 2 (two) times daily between meals., Disp: , Rfl: ;  Fe Fum-FePoly-Vit C-Vit B3 (INTEGRA PO), Take 1 capsule by mouth daily., Disp: , Rfl: ;  lactulose (CHRONULAC) 10 GM/15ML solution, Take 30 mLs (20 g total) by mouth 2 (two) times daily as needed for mild constipation., Disp: 240 mL, Rfl: 0 megestrol (MEGACE) 400 MG/10ML suspension, Take 10 mLs (400 mg total) by mouth 2 (two) times daily., Disp: 240 mL, Rfl: 0;  metoprolol succinate (TOPROL-XL) 50 MG 24 hr tablet, Take 1 tablet (50 mg  total) by mouth 2 (two) times daily. Take with or immediately following a meal., Disp: , Rfl: ;  mirtazapine (REMERON) 15 MG tablet, Take 15 mg by mouth at bedtime., Disp: , Rfl:  oxyCODONE-acetaminophen (PERCOCET/ROXICET) 5-325 MG per tablet, Take 1 tablet by mouth every 6 (six) hours as needed for moderate pain., Disp: 30 tablet, Rfl: 0;  pantoprazole (PROTONIX) 40 MG tablet, Take 40 mg by mouth daily., Disp: , Rfl: ;  polyethylene glycol (MIRALAX / GLYCOLAX) packet, Take 17 g by mouth daily., Disp: 14 each, Rfl: 0;  PRESCRIPTION MEDICATION, Take 120 mLs by mouth 3 (three) times daily. Med Plus, Disp: , Rfl:  senna-docusate (SENOKOT-S) 8.6-50 MG per tablet, Take 2 tablets by mouth 2 (two) times daily., Disp: 60 tablet, Rfl: 0;  vitamin B-12 (CYANOCOBALAMIN) 1000 MCG tablet, Take 1,000 mcg by mouth daily., Disp: , Rfl: ;  VITAMIN K PO, Take 2.5 mg by mouth once., Disp: , Rfl: ;  warfarin (COUMADIN) 1 MG tablet, Take 1 mg by mouth every morning., Disp: , Rfl:  Current facility-administered medications:vancomycin (VANCOCIN) IVPB 1000 mg/200 mL premix, 1,000 mg, Intravenous, Once, Hedy Jacob, PA-C   Results for orders placed during the hospital encounter of 06/01/13  APTT      Result Value Range   aPTT 36  24 - 37 seconds  BASIC METABOLIC PANEL      Result Value  Range   Sodium 153 (*) 137 - 147 mEq/L   Potassium 4.1  3.7 - 5.3 mEq/L   Chloride 118 (*) 96 - 112 mEq/L   CO2 21  19 - 32 mEq/L   Glucose, Bld 67 (*) 70 - 99 mg/dL   BUN 32 (*) 6 - 23 mg/dL   Creatinine, Ser 1.86 (*) 0.50 - 1.10 mg/dL   Calcium 10.8 (*) 8.4 - 10.5 mg/dL   GFR calc non Af Amer 23 (*) >90 mL/min   GFR calc Af Amer 27 (*) >90 mL/min  CBC      Result Value Range   WBC 13.0 (*) 4.0 - 10.5 K/uL   RBC 3.16 (*) 3.87 - 5.11 MIL/uL   Hemoglobin 8.9 (*) 12.0 - 15.0 g/dL   HCT 28.5 (*) 36.0 - 46.0 %   MCV 90.2  78.0 - 100.0 fL   MCH 28.2  26.0 - 34.0 pg   MCHC 31.2  30.0 - 36.0 g/dL   RDW 18.9 (*) 11.5 - 15.5 %    Platelets 262  150 - 400 K/uL  PROTIME-INR      Result Value Range   Prothrombin Time 18.2 (*) 11.6 - 15.2 seconds   INR 1.55 (*) 0.00 - 1.49    Review of Systems  Constitutional: Positive for weight loss and malaise/fatigue. Negative for fever and chills.  HENT: Positive for hearing loss.   Respiratory: Positive for shortness of breath. Negative for cough.   Cardiovascular: Positive for leg swelling. Negative for chest pain.  Gastrointestinal: Positive for abdominal pain. Negative for nausea and vomiting.  Genitourinary:       Recently treated UTI  Musculoskeletal: Positive for back pain.  Neurological: Positive for weakness.   Vitals: BP 148/90  HR 102 R 28  TEMP 97.6  O2 SATS  Physical Exam  Constitutional:  Lethargic, cachetic BF with brief responses to questions  Cardiovascular:  irreg irreg  Respiratory: Effort normal.  Sl dim BS rt base, left clear  GI: Soft. Bowel sounds are normal. There is no tenderness.  Musculoskeletal: She exhibits edema.  Neurological:  Pt awake but markedly lethargic, able to answer few questions but barely audible; noted hx of dementia     Assessment/Plan Patient with history of severe PCM, FTT, afib on coumadin,  dementia, esophageal dysmotility/prior hiatal hernia repair/?fundoplication presents today for percutaneous gastrostomy tube placement. Details/risks of procedure d/w pt's daughter, Morrell Riddle, with her understanding and consent.   Carlee Tesfaye,D KEVIN 06/01/2013, 11:17 AM

## 2013-06-01 NOTE — Discharge Instructions (Signed)
Gastrostomy Tube Home Guide °A gastrostomy tube is a tube that is surgically placed into the stomach. It is also called a "G-tube." G-tubes are used when a person is unable to eat and drink enough on their own to stay healthy. The tube is inserted into the stomach through a small cut (incision) in the skin. This tube is used for: °· Feeding. °· Giving medication. °GASTROSTOMY TUBE CARE °· Wash your hands with soap and water. °· Remove the old dressing (if any). Some styles of G-tubes may need a dressing inserted between the skin and the G-tube. Other types of G-tubes do not require a dressing. Ask your health care provider if a dressing is needed. °· Check the area where the tube enters the skin (insertion site) for redness, swelling, or pus-like (purulent) drainage. A small amount of clear or tan liquid drainage is normal. Check to make sure scar tissue (skin) is not growing around the insertion site. This could have a raised, bumpy appearance. °· A cotton swab can be used to clean the skin around the tube: °· When the G-tube is first put in, a normal saline solution or water can be used to clean the skin. °· Mild soap and warm water can be used when the skin around the G-tube site has healed. °· Roll the cotton swab around the G-tube insertion site to remove any drainage or crusting at the insertion site. °STOMACH RESIDUALS °Feeding tube residuals are the amount of liquids that are in the stomach at any given time. Residuals may be checked before giving feedings, medications, or as instructed by your health care provider. °· Ask your health care provider if there are instances when you would not start tube feedings depending on the amount or type of contents withdrawn from the stomach. °· Check residuals by attaching a syringe to the G-tube and pull back on the syringe plunger. Note the amount and return the residual back into the stomach. °FLUSHING THE G-TUBE °· The G-tube should be periodically flushed with  clean warm water to keep it from clogging. °· Flush the G-tube after feedings or medications. Draw up 30 mL of warm water in a syringe. Connect the syringe to the G-tube and slowly push the water into the tube. °· Do not push feedings, medications, or flushes rapidly. Flush the G-tube gently and slowly. °· Only use syringes made for G-tubes to flush medications or feedings. °· Your health care provider may want the G-tube flushed more often or with more water. If this is the case, follow your health care provider's instructions. °FEEDINGS °Your health care provider will determine whether feedings are given as a bolus (a certain amount given at one time and at scheduled times) or whether feedings will be given continuously on a feeding pump.  °· Formulas should be given at room temperature. °· If feedings are continuous, no more than 4 hours worth of feedings should be placed in the feeding bag. This helps prevent spoilage or accidental excess infusion. °· Cover and place unused formula in the refrigerator. °· If feedings are continuous, stop the feedings when medications or flushes are given. Be sure to restart the feedings. °· Feeding bags and syringes should be replaced as instructed by your health care provider. °GIVING MEDICATION  °· In general, it is best if all medications are in a liquid form for G-tube administration. Liquid medications are less likely to clog the G-tube. °· Mix the liquid medication with 30 mL (or amount recommended by your   health care provider) of warm water. °· Draw up the medication into the syringe. °· Attach the syringe to the G-tube and slowly push the mixture into the G-tube. °· After giving the medication, draw up 30 mL of warm water in the syringe and slowly flush the G-tube. °· For pills or capsules, check with your health care provider first before crushing medications. Some pills are not effective if they are crushed. Some capsules are sustained release medications. °· If  appropriate, crush the pill or capsule and mix with 30 mL of warm water. Using the syringe, slowly push the medication through the tube, then flush the tube with another 30 mL of tap water. °G-TUBE PROBLEMS °G-tube was pulled out. °· Cause: May have been pulled out accidentally. °· Solutions: Cover the opening with clean dressing and tape. Call your health care provider right away. The G-tube should be put in as soon as possible (within 4 hours) so the G-tube opening (tract) does not close. The G-tube needs to be put in at a healthcare setting. An X-ray needs to be done to confirm placement before the G-tube can be used again. °Redness, irritation, soreness, or foul odor around the gastrostomy site. °· Cause: May be caused by leakage or infection. °· Solutions: Call your health care provider right away. °Large amount of leakage of fluid or mucus-like liquid present (a large amount means it soaks clothing). °· Cause: Many reasons could cause the G-tube to leak. °· Solutions: Call your health care provider to discuss the amount of leakage. °Skin or scar tissue appears to be growing where tube enters skin.  °· Cause: Tissue growth may develop around the insertion site if the G-tube is moved or pulled on excessively. °· Solutions: Secure tube with tape so that excess movement does not occur. Call your health care provider. °G-tube is clogged. °· Cause: Thick formula or medication. °· Solutions: Try to slowly push warm water into the tube with a large syringe. Never try to push any object into the tube to unclog it. Do not force fluid into the G-tube. If you are unable to unclog the tube, call your health care provider right away. °TIPS °· Head of Bed (HOB) position refers to the upright position of a person's upper body. °· When giving medications or a feeding bolus, keep the HOB up as told by your health care provider. Do this during the feeding and for 1 hour after the feeding or medication administration. °· If  continuous feedings are being given, it is best to keep the HOB up as told by your health care provider. When ADLs (Activities of Daily Living) are performed and the HOB needs to be flat, be sure to turn the feeding pump off. Restart the feeding pump when the HOB is returned to the recommended height. °· Do not pull or put tension on the tube. °· To prevent fluid backflow, kink the G-tube before removing the cap or disconnecting a syringe. °· Check the G-tube length every day. Measure from the insertion site to the end of the G-tube. If the length is longer than previous measurements, the tube may be coming out. Call your health care provider if you notice increasing G-tube length. °· Oral care, such as brushing teeth, must be continued. °· You may need to remove excess air (vent) from the G-tube. Your health care provider will tell you if this is needed. °· Always call your health care provider if you have questions or problems with the G-tube. °  SEEK IMMEDIATE MEDICAL CARE IF:   You have severe abdominal pain, tenderness, or abdominal bloating (distension).  You have nausea or vomiting.  You are constipated or have problems moving your bowels.  The G-tube insertion site is red, swollen, has a foul smell, or has yellow or brown drainage.  You have difficulty breathing or shortness of breath.  You have a fever.  You have a large amount of feeding tube residuals.  The G-tube is clogged and cannot be flushed. MAKE SURE YOU:   Understand these instructions.  Will watch your condition.  Will get help right away if you are not doing well or get worse. Document Released: 06/28/2001 Document Revised: 02/07/2013 Document Reviewed: 12/25/2012 [facility] Patient Information 2014 [place]. Moderate Sedation, Adult Moderate sedation is given to help you relax or even sleep through a procedure. You may remain sleepy, be clumsy, or have poor balance for several hours following this procedure.  Arrange for a responsible adult, family member, or friend to take you home. A responsible adult should stay with you for at least 24 hours or until the medicines have worn off.  Do not participate in any activities where you could become injured for the next 24 hours, or until you feel normal again. Do not:  Drive.  Swim.  Ride a bicycle.  Operate heavy machinery.  Cook.  Use power tools.  Climb ladders.  Work at [employer].  Do not make important decisions or sign legal documents until you are improved.  Vomiting may occur if you eat too soon. When you can drink without vomiting, try water, juice, or soup. Try solid foods if you feel little or no nausea.  Only take over-the-counter or prescription medications for pain, discomfort, or fever as directed by your caregiver.If pain medications have been prescribed for you, ask your caregiver how soon it is safe to take them.  Make sure you and your family fully understands everything about the medication given to you. Make sure you understand what side effects may occur.  You should not drink alcohol, take sleeping pills, or medications that cause drowsiness for at least 24 hours.  If you smoke, do not smoke alone.  If you are feeling better, you may resume normal activities 24 hours after receiving sedation.  Keep all appointments as scheduled. Follow all instructions.  Ask questions if you do not understand. SEEK MEDICAL CARE IF:   Your skin is pale or bluish in color.  You continue to feel sick to your stomach (nauseous) or throw up (vomit).  Your pain is getting worse and not helped by medication.  You have bleeding or swelling.  You are still sleepy or feeling clumsy after 24 hours. SEEK IMMEDIATE MEDICAL CARE IF:   You develop a rash.  You have difficulty breathing.  You develop any type of allergic problem.  You have a fever. Document Released: 01/12/2001 Document Revised: 07/12/2011 Document Reviewed:  12/25/2012 [facility] Patient Information 2014 [place].  Per Radiology MD: Cleanse with soap and water daily and dress with triple antibiotic ointment and gauze daily. Notify [facility] radiology at [phone] for questions/concerns regarding g-tube. Cap G-port. Irrigate with 46ml water after medications and at least q shift. May begin using g-tube on 06/02/13 at 1600.

## 2013-06-01 NOTE — Procedures (Signed)
Successful fluoroscopic guided insertion of gastrostomy tube without immediate post procedural complicatoin.   The gastrostomy tube may be used immediately for medications.  Otherwise, place gastrostomy tube to low wall suction for 24 hrs.  Tube feeds may be initiated in 24 hours as per the primary team.   

## 2013-06-01 NOTE — H&P (Signed)
Prolonged discussion were held with the pt's dtr and husband regarding appropriateness of G-tube placement.  All potential benefits and risks (including but not limited to bleeding, infection, tube malfunction/malposition, injury to adjacent organs) of G-tube placement were discussed with the pt's family and the pt's family is insistent on having a G-tube placed and believe it is what their family member would have wanted.

## 2013-06-01 NOTE — Progress Notes (Signed)
Report called to [facility] regarding new g-tube placement.  Reviewed Radiologist's instructions regarding g-tube usage.  Instructions are being sent with pt to the facility also.

## 2013-06-04 ENCOUNTER — Inpatient Hospital Stay (HOSPITAL_COMMUNITY)
Admission: EM | Admit: 2013-06-04 | Discharge: 2013-07-01 | DRG: 871 | Disposition: E | Payer: Medicare FFS | Attending: Internal Medicine | Admitting: Internal Medicine

## 2013-06-04 ENCOUNTER — Emergency Department (HOSPITAL_COMMUNITY): Payer: Medicare FFS

## 2013-06-04 ENCOUNTER — Encounter (HOSPITAL_COMMUNITY): Payer: Self-pay | Admitting: Emergency Medicine

## 2013-06-04 DIAGNOSIS — R5381 Other malaise: Secondary | ICD-10-CM | POA: Diagnosis present

## 2013-06-04 DIAGNOSIS — Z515 Encounter for palliative care: Secondary | ICD-10-CM

## 2013-06-04 DIAGNOSIS — E119 Type 2 diabetes mellitus without complications: Secondary | ICD-10-CM | POA: Diagnosis present

## 2013-06-04 DIAGNOSIS — Z66 Do not resuscitate: Secondary | ICD-10-CM | POA: Diagnosis present

## 2013-06-04 DIAGNOSIS — E876 Hypokalemia: Secondary | ICD-10-CM | POA: Diagnosis present

## 2013-06-04 DIAGNOSIS — I129 Hypertensive chronic kidney disease with stage 1 through stage 4 chronic kidney disease, or unspecified chronic kidney disease: Secondary | ICD-10-CM | POA: Diagnosis present

## 2013-06-04 DIAGNOSIS — J101 Influenza due to other identified influenza virus with other respiratory manifestations: Secondary | ICD-10-CM | POA: Diagnosis present

## 2013-06-04 DIAGNOSIS — R6521 Severe sepsis with septic shock: Secondary | ICD-10-CM

## 2013-06-04 DIAGNOSIS — IMO0001 Reserved for inherently not codable concepts without codable children: Secondary | ICD-10-CM

## 2013-06-04 DIAGNOSIS — G8929 Other chronic pain: Secondary | ICD-10-CM | POA: Diagnosis present

## 2013-06-04 DIAGNOSIS — I252 Old myocardial infarction: Secondary | ICD-10-CM

## 2013-06-04 DIAGNOSIS — A419 Sepsis, unspecified organism: Principal | ICD-10-CM | POA: Diagnosis present

## 2013-06-04 DIAGNOSIS — D473 Essential (hemorrhagic) thrombocythemia: Secondary | ICD-10-CM | POA: Diagnosis present

## 2013-06-04 DIAGNOSIS — Y95 Nosocomial condition: Secondary | ICD-10-CM

## 2013-06-04 DIAGNOSIS — E870 Hyperosmolality and hypernatremia: Secondary | ICD-10-CM | POA: Diagnosis present

## 2013-06-04 DIAGNOSIS — I4891 Unspecified atrial fibrillation: Secondary | ICD-10-CM | POA: Diagnosis present

## 2013-06-04 DIAGNOSIS — E785 Hyperlipidemia, unspecified: Secondary | ICD-10-CM | POA: Diagnosis present

## 2013-06-04 DIAGNOSIS — J1100 Influenza due to unidentified influenza virus with unspecified type of pneumonia: Secondary | ICD-10-CM | POA: Diagnosis present

## 2013-06-04 DIAGNOSIS — R6520 Severe sepsis without septic shock: Secondary | ICD-10-CM

## 2013-06-04 DIAGNOSIS — Z7901 Long term (current) use of anticoagulants: Secondary | ICD-10-CM

## 2013-06-04 DIAGNOSIS — Z853 Personal history of malignant neoplasm of breast: Secondary | ICD-10-CM

## 2013-06-04 DIAGNOSIS — L8990 Pressure ulcer of unspecified site, unspecified stage: Secondary | ICD-10-CM | POA: Diagnosis present

## 2013-06-04 DIAGNOSIS — D469 Myelodysplastic syndrome, unspecified: Secondary | ICD-10-CM

## 2013-06-04 DIAGNOSIS — K219 Gastro-esophageal reflux disease without esophagitis: Secondary | ICD-10-CM | POA: Diagnosis present

## 2013-06-04 DIAGNOSIS — E860 Dehydration: Secondary | ICD-10-CM | POA: Diagnosis present

## 2013-06-04 DIAGNOSIS — E872 Acidosis, unspecified: Secondary | ICD-10-CM | POA: Diagnosis present

## 2013-06-04 DIAGNOSIS — F0390 Unspecified dementia without behavioral disturbance: Secondary | ICD-10-CM | POA: Diagnosis present

## 2013-06-04 DIAGNOSIS — M549 Dorsalgia, unspecified: Secondary | ICD-10-CM | POA: Diagnosis present

## 2013-06-04 DIAGNOSIS — E43 Unspecified severe protein-calorie malnutrition: Secondary | ICD-10-CM | POA: Diagnosis present

## 2013-06-04 DIAGNOSIS — L89109 Pressure ulcer of unspecified part of back, unspecified stage: Secondary | ICD-10-CM | POA: Diagnosis present

## 2013-06-04 DIAGNOSIS — Z931 Gastrostomy status: Secondary | ICD-10-CM

## 2013-06-04 DIAGNOSIS — I878 Other specified disorders of veins: Secondary | ICD-10-CM | POA: Diagnosis present

## 2013-06-04 DIAGNOSIS — N179 Acute kidney failure, unspecified: Secondary | ICD-10-CM | POA: Diagnosis present

## 2013-06-04 DIAGNOSIS — J189 Pneumonia, unspecified organism: Secondary | ICD-10-CM | POA: Diagnosis present

## 2013-06-04 DIAGNOSIS — J9600 Acute respiratory failure, unspecified whether with hypoxia or hypercapnia: Secondary | ICD-10-CM

## 2013-06-04 DIAGNOSIS — Z880 Allergy status to penicillin: Secondary | ICD-10-CM

## 2013-06-04 DIAGNOSIS — N182 Chronic kidney disease, stage 2 (mild): Secondary | ICD-10-CM | POA: Diagnosis present

## 2013-06-04 DIAGNOSIS — R627 Adult failure to thrive: Secondary | ICD-10-CM | POA: Diagnosis present

## 2013-06-04 DIAGNOSIS — D649 Anemia, unspecified: Secondary | ICD-10-CM | POA: Diagnosis present

## 2013-06-04 DIAGNOSIS — J9601 Acute respiratory failure with hypoxia: Secondary | ICD-10-CM | POA: Diagnosis present

## 2013-06-04 DIAGNOSIS — Z86711 Personal history of pulmonary embolism: Secondary | ICD-10-CM | POA: Diagnosis present

## 2013-06-04 DIAGNOSIS — IMO0002 Reserved for concepts with insufficient information to code with codable children: Secondary | ICD-10-CM

## 2013-06-04 HISTORY — DX: Constipation, unspecified: K59.00

## 2013-06-04 HISTORY — DX: Disorder of kidney and ureter, unspecified: N28.9

## 2013-06-04 LAB — URINE MICROSCOPIC-ADD ON

## 2013-06-04 LAB — URINALYSIS, ROUTINE W REFLEX MICROSCOPIC
Glucose, UA: NEGATIVE mg/dL
Hgb urine dipstick: NEGATIVE
KETONES UR: NEGATIVE mg/dL
LEUKOCYTES UA: NEGATIVE
Nitrite: NEGATIVE
Protein, ur: 30 mg/dL — AB
SPECIFIC GRAVITY, URINE: 1.019 (ref 1.005–1.030)
UROBILINOGEN UA: 1 mg/dL (ref 0.0–1.0)
pH: 5 (ref 5.0–8.0)

## 2013-06-04 LAB — COMPREHENSIVE METABOLIC PANEL
ALT: 14 U/L (ref 0–35)
AST: 62 U/L — AB (ref 0–37)
Albumin: 1.4 g/dL — ABNORMAL LOW (ref 3.5–5.2)
Alkaline Phosphatase: 70 U/L (ref 39–117)
BILIRUBIN TOTAL: 0.4 mg/dL (ref 0.3–1.2)
BUN: 38 mg/dL — ABNORMAL HIGH (ref 6–23)
CHLORIDE: 122 meq/L — AB (ref 96–112)
CO2: 18 meq/L — AB (ref 19–32)
Calcium: 9.5 mg/dL (ref 8.4–10.5)
Creatinine, Ser: 1.67 mg/dL — ABNORMAL HIGH (ref 0.50–1.10)
GFR calc Af Amer: 30 mL/min — ABNORMAL LOW (ref >90)
GFR, EST NON AFRICAN AMERICAN: 26 mL/min — AB (ref >90)
Glucose, Bld: 96 mg/dL (ref 70–99)
Potassium: 5.5 mEq/L — ABNORMAL HIGH (ref 3.7–5.3)
SODIUM: 157 meq/L — AB (ref 137–147)
Total Protein: 7.5 g/dL (ref 6.0–8.3)

## 2013-06-04 LAB — CBC WITH DIFFERENTIAL/PLATELET
BASOS PCT: 0 % (ref 0–1)
Basophils Absolute: 0 10*3/uL (ref 0.0–0.1)
EOS PCT: 0 % (ref 0–5)
Eosinophils Absolute: 0 10*3/uL (ref 0.0–0.7)
HEMATOCRIT: 27.9 % — AB (ref 36.0–46.0)
HEMOGLOBIN: 8.7 g/dL — AB (ref 12.0–15.0)
LYMPHS PCT: 11 % — AB (ref 12–46)
Lymphs Abs: 2.4 10*3/uL (ref 0.7–4.0)
MCH: 27.7 pg (ref 26.0–34.0)
MCHC: 31.2 g/dL (ref 30.0–36.0)
MCV: 88.9 fL (ref 78.0–100.0)
MONOS PCT: 2 % — AB (ref 3–12)
Monocytes Absolute: 0.4 10*3/uL (ref 0.1–1.0)
NEUTROS PCT: 87 % — AB (ref 43–77)
NRBC: 6 /100{WBCs} — AB
Neutro Abs: 19.3 10*3/uL — ABNORMAL HIGH (ref 1.7–7.7)
Platelets: 180 10*3/uL (ref 150–400)
RBC: 3.14 MIL/uL — AB (ref 3.87–5.11)
RDW: 19.3 % — ABNORMAL HIGH (ref 11.5–15.5)
WBC: 22.1 10*3/uL — ABNORMAL HIGH (ref 4.0–10.5)

## 2013-06-04 LAB — POCT I-STAT 3, ART BLOOD GAS (G3+)
ACID-BASE DEFICIT: 6 mmol/L — AB (ref 0.0–2.0)
Bicarbonate: 17.2 mEq/L — ABNORMAL LOW (ref 20.0–24.0)
O2 Saturation: 99 %
PO2 ART: 119 mmHg — AB (ref 80.0–100.0)
Patient temperature: 98.6
TCO2: 18 mmol/L (ref 0–100)
pCO2 arterial: 22.2 mmHg — ABNORMAL LOW (ref 35.0–45.0)
pH, Arterial: 7.496 — ABNORMAL HIGH (ref 7.350–7.450)

## 2013-06-04 LAB — INFLUENZA PANEL BY PCR (TYPE A & B)
H1N1 flu by pcr: NOT DETECTED
INFLAPCR: POSITIVE — AB
Influenza B By PCR: NEGATIVE

## 2013-06-04 LAB — OCCULT BLOOD, POC DEVICE: Fecal Occult Bld: NEGATIVE

## 2013-06-04 LAB — CG4 I-STAT (LACTIC ACID): LACTIC ACID, VENOUS: 4.89 mmol/L — AB (ref 0.5–2.2)

## 2013-06-04 MED ORDER — SODIUM CHLORIDE 0.9 % IV BOLUS (SEPSIS)
1000.0000 mL | Freq: Once | INTRAVENOUS | Status: AC
  Administered 2013-06-04: 1000 mL via INTRAVENOUS

## 2013-06-04 MED ORDER — SODIUM CHLORIDE 0.9 % IV SOLN
INTRAVENOUS | Status: AC
  Administered 2013-06-04: 23:00:00 via INTRAVENOUS
  Administered 2013-06-05: 1000 mL via INTRAVENOUS

## 2013-06-04 MED ORDER — DEXTROSE 5 % IV SOLN
1.0000 g | Freq: Two times a day (BID) | INTRAVENOUS | Status: DC
  Administered 2013-06-04 – 2013-06-05 (×3): 1 g via INTRAVENOUS
  Filled 2013-06-04 (×6): qty 1

## 2013-06-04 MED ORDER — SODIUM CHLORIDE 0.9 % IV BOLUS (SEPSIS): 500.0000 mL | Freq: Once | INTRAVENOUS | Status: DC

## 2013-06-04 MED ORDER — ACETAMINOPHEN 650 MG RE SUPP
650.0000 mg | Freq: Once | RECTAL | Status: AC
  Administered 2013-06-04: 650 mg via RECTAL
  Filled 2013-06-04: qty 1

## 2013-06-04 MED ORDER — VANCOMYCIN HCL IN DEXTROSE 1-5 GM/200ML-% IV SOLN
1000.0000 mg | Freq: Once | INTRAVENOUS | Status: AC
  Administered 2013-06-04: 1000 mg via INTRAVENOUS
  Filled 2013-06-04: qty 200

## 2013-06-04 NOTE — ED Notes (Signed)
MD at bedside. (ED Physician).  Discussed both NS orders of 500 and 1081mL for clarification.  MD orders to give only 1078mL bolus.

## 2013-06-04 NOTE — ED Notes (Signed)
Pt family has not made a decision about patients code status at this time. Are currently in the family room with chaplain. Resident has spoken to both her daughter and husband.

## 2013-06-04 NOTE — ED Notes (Signed)
MD at bedside. 

## 2013-06-04 NOTE — ED Notes (Signed)
Admitting MD is aware of patient's history of right sided breast cancer in the past.  Ok to use right arm for blood draw.

## 2013-06-04 NOTE — ED Notes (Signed)
Respiratory at bedside.

## 2013-06-04 NOTE — ED Notes (Signed)
MD at bedside. (ED Physician).  He discusses plan of care, and orders another 1L bolus of NS after this second one currently running per the Intensivist's recommendation.

## 2013-06-04 NOTE — ED Notes (Signed)
Family at bedside. 

## 2013-06-04 NOTE — ED Provider Notes (Signed)
CSN: [id number]     Arrival date & time 2013-07-03  1759 History   First MD Initiated Contact with Patient 07/03/2013 1800     Chief Complaint  Patient presents with  . Respiratory Distress   (Consider location/radiation/quality/duration/timing/severity/associated sxs/prior Treatment) Patient is a 80 y.o. female presenting with general illness. The history is provided by the patient, the EMS personnel and a relative. The history is limited by the condition of the patient.  Illness Severity:  Severe Onset quality:  Gradual Duration:  2 days Timing:  Constant Progression:  Worsening Chronicity:  New Associated symptoms: cough, fatigue and fever     80 year old female with a chief complaint respiratory distress. Per family patient has had decreased mental status over the past couple days. Patient also not eating over the past 3 months. Recent placement of the G-tube 2 days ago. Family nurses fever over the past couple days also with cough. Patient today and unable to speak to family called EMS to bring her here. Patient with a history of breast cancer and had progressive weight loss and wasting. At last visit family was counseled for palliative care. Family still feeling of the patient should be a full code.   Past Medical History  Diagnosis Date  . Atrial fibrillation   . Hyperlipemia   . GERD (gastroesophageal reflux disease)   . Hypertension   . Breast cancer   . Angina at rest   . Myocardial infarction 2008    Dr Einar Gip  . Dementia oct 2014    memory issues since oct 2014  . Constipation   . Renal disorder     Acute kidney failure   Past Surgical History  Procedure Laterality Date  . Breast surgery    . Lymphadenectomy    . Hiatal hernia repair    . Cesarean section     History reviewed. No pertinent family history. History  Substance Use Topics  . Smoking status: Never Smoker   . Smokeless tobacco: Not on file  . Alcohol Use: No   OB History   Grav Para Term Preterm  Abortions TAB SAB Ect Mult Living                 Review of Systems  Unable to perform ROS: Patient unresponsive  Constitutional: Positive for fever and fatigue.  Respiratory: Positive for cough.     Allergies  Penicillins  Home Medications   Current Outpatient Rx  Name  Route  Sig  Dispense  Refill  . amiodarone (PACERONE) 400 MG tablet   Oral   Take 0.5 tablets (200 mg total) by mouth 2 (two) times daily.   60 tablet   0   . cholecalciferol (VITAMIN D) 1000 UNITS tablet   Oral   Take 1,000 Units by mouth daily.         Marland Kitchen diltiazem (DILACOR XR) 240 MG 24 hr capsule   Oral   Take 240 mg by mouth every morning.         Marland Kitchen ENSURE PLUS (ENSURE PLUS) LIQD   Oral   Take 237 mLs by mouth 2 (two) times daily between meals.         . Fe Fum-FePoly-Vit C-Vit B3 (INTEGRA PO)   Oral   Take 1 capsule by mouth daily.         Marland Kitchen lactulose (CHRONULAC) 10 GM/15ML solution   Oral   Take 30 mLs (20 g total) by mouth 2 (two) times daily as needed for mild constipation.  240 mL   0   . megestrol (MEGACE) 400 MG/10ML suspension   Oral   Take 10 mLs (400 mg total) by mouth 2 (two) times daily.   240 mL   0   . metoprolol succinate (TOPROL-XL) 50 MG 24 hr tablet   Oral   Take 1 tablet (50 mg total) by mouth 2 (two) times daily. Take with or immediately following a meal.         . mirtazapine (REMERON) 15 MG tablet   Oral   Take 15 mg by mouth at bedtime.         Marland Kitchen oxyCODONE-acetaminophen (PERCOCET/ROXICET) 5-325 MG per tablet   Oral   Take 1 tablet by mouth every 6 (six) hours as needed for moderate pain.   30 tablet   0   . pantoprazole (PROTONIX) 40 MG tablet   Oral   Take 40 mg by mouth daily.         . polyethylene glycol (MIRALAX / GLYCOLAX) packet   Oral   Take 17 g by mouth daily.   14 each   0   . PRESCRIPTION MEDICATION   Oral   Take 120 mLs by mouth 3 (three) times daily. Med Plus         . senna-docusate (SENOKOT-S) 8.6-50 MG per  tablet   Oral   Take 2 tablets by mouth 2 (two) times daily.   60 tablet   0   . vitamin B-12 (CYANOCOBALAMIN) 1000 MCG tablet   Oral   Take 1,000 mcg by mouth daily.         Marland Kitchen warfarin (COUMADIN) 1 MG tablet   Oral   Take 1 mg by mouth every morning.          BP 93/45  Pulse 125  Temp(Src) 98.6 F (37 C) (Rectal)  Resp 24  SpO2 98% Physical Exam  Nursing note and vitals reviewed. Constitutional: She appears cachectic. No distress.  Hypotensive tachycardic  tachypneic  HENT:  Head: Normocephalic and atraumatic.  Eyes: EOM are normal. Pupils are equal, round, and reactive to light.  Neck: Normal range of motion. Neck supple.  Cardiovascular: Normal rate and regular rhythm.  Exam reveals no gallop and no friction rub.   No murmur heard. Pulmonary/Chest: She is in respiratory distress. She has no wheezes. She has no rales.  Abdominal: Soft. She exhibits no distension. There is no tenderness. There is no rebound.  Musculoskeletal: She exhibits no edema and no tenderness.  Neurological: GCS eye subscore is 4. GCS verbal subscore is 1. GCS motor subscore is 1.  Skin: Skin is warm and dry. She is not diaphoretic.  Psychiatric: She has a normal mood and affect. Her behavior is normal.    ED Course  Procedures (including critical care time) Labs Review Labs Reviewed  CBC WITH DIFFERENTIAL - Abnormal; Notable for the following:    WBC 22.1 (*)    RBC 3.14 (*)    Hemoglobin 8.7 (*)    HCT 27.9 (*)    RDW 19.3 (*)    Neutrophils Relative % 87 (*)    Lymphocytes Relative 11 (*)    Monocytes Relative 2 (*)    nRBC 6 (*)    Neutro Abs 19.3 (*)    All other components within normal limits  COMPREHENSIVE METABOLIC PANEL - Abnormal; Notable for the following:    Sodium 157 (*)    Potassium 5.5 (*)    Chloride 122 (*)    CO2  18 (*)    BUN 38 (*)    Creatinine, Ser 1.67 (*)    Albumin 1.4 (*)    AST 62 (*)    GFR calc non Af Amer 26 (*)    GFR calc Af Amer 30 (*)     All other components within normal limits  URINALYSIS, ROUTINE W REFLEX MICROSCOPIC - Abnormal; Notable for the following:    Color, Urine AMBER (*)    APPearance HAZY (*)    Bilirubin Urine SMALL (*)    Protein, ur 30 (*)    All other components within normal limits  URINE MICROSCOPIC-ADD ON - Abnormal; Notable for the following:    Casts HYALINE CASTS (*)    All other components within normal limits  CG4 I-STAT (LACTIC ACID) - Abnormal; Notable for the following:    Lactic Acid, Venous 4.89 (*)    All other components within normal limits  POCT I-STAT 3, BLOOD GAS (G3+) - Abnormal; Notable for the following:    pH, Arterial 7.496 (*)    pCO2 arterial 22.2 (*)    pO2, Arterial 119.0 (*)    Bicarbonate 17.2 (*)    Acid-base deficit 6.0 (*)    All other components within normal limits  URINE CULTURE  CULTURE, BLOOD (ROUTINE X 2)  CULTURE, BLOOD (ROUTINE X 2)  PROTIME-INR  SALICYLATE LEVEL  INFLUENZA PANEL BY PCR (TYPE A & B, H1N1)  LEGIONELLA ANTIGEN, URINE  LACTIC ACID, PLASMA  PROCALCITONIN  OCCULT BLOOD, POC DEVICE   Imaging Review Dg Chest Portable 1 View  06/26/2013   CLINICAL DATA:  Dyspnea.  EXAM: PORTABLE CHEST - 1 VIEW  COMPARISON:  04/10/2013.  FINDINGS: Borderline enlarged cardiac silhouette with an interval decrease in size. Decreased inspiration. No significant change in diffuse peribronchial thickening and accentuation of the interstitial markings. Interval minimal patchy opacity at the right lung base and small amount of medial left lung base linear density. Retained contrast in the colon. PEG tube. Mild scoliosis. Diffuse osteopenia.  IMPRESSION: 1. Poor inspiration with minimal right basilar patchy atelectasis or pneumonia. 2. Stable changes of COPD and chronic bronchitis. 3. Minimal left lower lobe atelectasis or scarring.   Electronically Signed   By: Enrique Sack M.D.   On: 06/03/2013 19:28      Date/Time:  Monday June 04 2013 21:17:23 EST Ventricular Rate:   108 PR Interval:    QRS Duration: 137 QT Interval:  472 QTC Calculation: 633 R Axis:   82 Text Interpretation:  Atrial fibrillation Paired ventricular premature complexes Nonspecific intraventricular conduction delay Inferior infarct, old Anteroseptal infarct, age indeterminate No significant change since last tracing Confirmed by Miri Jose  MD, Aidah Forquer ([id number]) on 06/05/2013 12:07:16 AM        MDM   1. Sepsis   2. Acute renal failure   3. Acute respiratory failure with hypoxia   4. Adult failure to thrive   5. Atrial fibrillation with RVR   6. Hospital acquired PNA   7. Hypernatremia   8. Protein-calorie malnutrition, severe      80 year old female with respiratory distress. Patient found to have bilateral pneumonia and started on vancomycin cefepime. Given 3 L of fluid. Patient initially hypertensive with systolic in the 0000000. Improved to the 120s with 1 L of fluid. After his second and third liter patient was responsive but unable to formulate response. Decision was made not to intubate secondary to patient's underlying status.   Patient with unremarkable urine, lactate 4.9. Critical care feels that  he is not appropriate for their service currently. Will admit to a step down service.    Deno Etienne, MD 06/16/2013 2340  Blanchie Dessert, MD 06/05/13 5726

## 2013-06-04 NOTE — ED Notes (Signed)
Daughter's name is Romie Minus, and her number is [phone].

## 2013-06-04 NOTE — Progress Notes (Signed)
Per request by ED staff, Chaplain went to consultation C to render emotional support to family of pt in trauma C.  Chaplain engaged family in conversation and listened empathetically.  Chaplain will follow up tonight.      2100  Clinical Encounter Type  Visited With Family  Visit Type Spiritual support;ED    Estelle June, chaplain pager [phone]

## 2013-06-04 NOTE — ED Notes (Addendum)
Per EMS: Pt from [facility]. Pt family called staff into patient's room for labored breathing. Pt's RR was 30, Pt was 88%RA with EMS and then placed on a NRB and stats were increased but hard to read on machine. Pt's baseline is non-verbal. Staff was trying to get family to sign DNR but family did not want to sign so patient is a full code.

## 2013-06-04 NOTE — ED Notes (Signed)
NOTIFIED DR. PLUNKETT IN PERSON OF PATIENTS LAB RESULTS OF CG4+ LACTIC ACID ,@17 :59 PM ,06/06/2013.

## 2013-06-04 NOTE — H&P (Signed)
PCP:   Laverda Page, MD   Chief Complaint:  fever  HPI: 80 yo female unresponsive from snf, h/o FTT for several months had peg placed this past week, afib, dementia, comes in with several days of worsening mental status, fever and sob.  Pt is nonverbal at this time.  She appaers comfortable.  She is full code.  Family not present now, but is considering hospice if pt does not respond to tx for her pna per EDP report.  Pt has been hypotensive in ED with sbp low 80s, hr around 140.  Temp almost 103.  Vitals are responding after 3 L ivf bolus.  Review of Systems:  Unable to obtain  Past Medical History: Past Medical History  Diagnosis Date  . Atrial fibrillation   . Hyperlipemia   . GERD (gastroesophageal reflux disease)   . Hypertension   . Breast cancer   . Angina at rest   . Myocardial infarction 2008    Dr Einar Gip  . Dementia oct 2014    memory issues since oct 2014  . Constipation   . Renal disorder     Acute kidney failure   Past Surgical History  Procedure Laterality Date  . Breast surgery    . Lymphadenectomy    . Hiatal hernia repair    . Cesarean section      Medications: Prior to Admission medications   Medication Sig Start Date End Date Taking? Authorizing Provider  amiodarone (PACERONE) 400 MG tablet Take 0.5 tablets (200 mg total) by mouth 2 (two) times daily. 04/16/13  Yes Nita Sells, MD  cholecalciferol (VITAMIN D) 1000 UNITS tablet Take 1,000 Units by mouth daily.   Yes Historical Provider, MD  diltiazem (DILACOR XR) 240 MG 24 hr capsule Take 240 mg by mouth every morning.   Yes Historical Provider, MD  ENSURE PLUS (ENSURE PLUS) LIQD Take 237 mLs by mouth 2 (two) times daily between meals.   Yes Historical Provider, MD  Fe Fum-FePoly-Vit C-Vit B3 (INTEGRA PO) Take 1 capsule by mouth daily.   Yes Historical Provider, MD  lactulose (CHRONULAC) 10 GM/15ML solution Take 30 mLs (20 g total) by mouth 2 (two) times daily as needed for mild  constipation. 04/16/13  Yes Nita Sells, MD  megestrol (MEGACE) 400 MG/10ML suspension Take 10 mLs (400 mg total) by mouth 2 (two) times daily. 04/16/13  Yes Nita Sells, MD  metoprolol succinate (TOPROL-XL) 50 MG 24 hr tablet Take 1 tablet (50 mg total) by mouth 2 (two) times daily. Take with or immediately following a meal. 04/16/13  Yes Nita Sells, MD  mirtazapine (REMERON) 15 MG tablet Take 15 mg by mouth at bedtime.   Yes Historical Provider, MD  oxyCODONE-acetaminophen (PERCOCET/ROXICET) 5-325 MG per tablet Take 1 tablet by mouth every 6 (six) hours as needed for moderate pain. 04/16/13  Yes Nita Sells, MD  pantoprazole (PROTONIX) 40 MG tablet Take 40 mg by mouth daily.   Yes Historical Provider, MD  polyethylene glycol (MIRALAX / GLYCOLAX) packet Take 17 g by mouth daily. 03/27/13  Yes Bonnielee Haff, MD  PRESCRIPTION MEDICATION Take 120 mLs by mouth 3 (three) times daily. Med Plus   Yes Historical Provider, MD  senna-docusate (SENOKOT-S) 8.6-50 MG per tablet Take 2 tablets by mouth 2 (two) times daily. 03/27/13  Yes Bonnielee Haff, MD  vitamin B-12 (CYANOCOBALAMIN) 1000 MCG tablet Take 1,000 mcg by mouth daily.   Yes Historical Provider, MD  warfarin (COUMADIN) 1 MG tablet Take 1 mg by mouth every  morning.   Yes Historical Provider, MD    Allergies:   Allergies  Allergen Reactions  . Penicillins Nausea And Vomiting    "almost pass out"    Social History:  reports that she has never smoked. She does not have any smokeless tobacco history on file. She reports that she does not drink alcohol or use illicit drugs.  Family History: History reviewed. No pertinent family history.  Physical Exam: Filed Vitals:   06/22/2013 2015 06/27/2013 2045 06/06/2013 2115 06/18/2013 2145  BP: 94/67 93/43 109/61 125/53  Pulse:      Temp:      TempSrc:      Resp: 28 18 21 21   SpO2:       General appearance: alert, cachectic, mild distress, slowed mentation and  toxic Head: Normocephalic, without obvious abnormality, atraumatic Eyes: negative Nose: Nares normal. Septum midline. Mucosa normal. No drainage or sinus tenderness. Neck: no JVD and supple, symmetrical, trachea midline Lungs: rhonchi bilaterally Heart: irregularly irregular rhythm Abdomen: soft, non-tender; bowel sounds normal; no masses,  no organomegaly Extremities: extremities normal, atraumatic, no cyanosis or edema Pulses: 2+ and symmetric Skin: Skin color, texture, turgor normal. No rashes or lesions Neurologic: Grossly normal    Labs on Admission:   Recent Labs  06/23/2013 1813  NA 157*  K 5.5*  CL 122*  CO2 18*  GLUCOSE 96  BUN 38*  CREATININE 1.67*  CALCIUM 9.5    Recent Labs  06/23/2013 1813  AST 62*  ALT 14  ALKPHOS 70  BILITOT 0.4  PROT 7.5  ALBUMIN 1.4*    Recent Labs  06/07/2013 1813  WBC 22.1*  NEUTROABS 19.3*  HGB 8.7*  HCT 27.9*  MCV 88.9  PLT 180    Radiological Exams on Admission:  Dg Chest Portable 1 View  06/09/2013   CLINICAL DATA:  Dyspnea.  EXAM: PORTABLE CHEST - 1 VIEW  COMPARISON:  04/10/2013.  FINDINGS: Borderline enlarged cardiac silhouette with an interval decrease in size. Decreased inspiration. No significant change in diffuse peribronchial thickening and accentuation of the interstitial markings. Interval minimal patchy opacity at the right lung base and small amount of medial left lung base linear density. Retained contrast in the colon. PEG tube. Mild scoliosis. Diffuse osteopenia.  IMPRESSION: 1. Poor inspiration with minimal right basilar patchy atelectasis or pneumonia. 2. Stable changes of COPD and chronic bronchitis. 3. Minimal left lower lobe atelectasis or scarring.   Electronically Signed   By: Enrique Sack M.D.   On: 06/27/2013 19:28    Assessment/Plan  80 yo female with septic shock from hcap with overall poor nutrition, worsening renal failure, afib w rvr  Principal Problem:   Sepsis-  From pna.  Place on iv  vanc/cefepime.  pccm has been consulted, formal consult is pending, recommend stepdown at this time.  Pt overall with very poor prognosis.  Consider another palliative care consult with family tomorrow.  Oxygen supplementation.  Ivf.  Cultures blood and urine pending.  Hold feeds for now.  Repeat lactic acid level later tonight.   FULL CODE    Active Problems:   Protein-calorie malnutrition, severe   Atrial fibrillation with RVR   History of pulmonary embolism   Acute renal failure   Diabetes   Failure to thrive syndrome, adult, likely secondary to multiple debilitating illnesses   Myelodysplasia?? with leukocytosis and thrombocytosis   Hypernatremia   Hospital acquired PNA   Acute respiratory failure with hypoxia    Masson Nalepa A 06/03/2013, 10:23 PM

## 2013-06-05 DIAGNOSIS — E860 Dehydration: Secondary | ICD-10-CM | POA: Diagnosis present

## 2013-06-05 DIAGNOSIS — I878 Other specified disorders of veins: Secondary | ICD-10-CM | POA: Diagnosis present

## 2013-06-05 DIAGNOSIS — J101 Influenza due to other identified influenza virus with other respiratory manifestations: Secondary | ICD-10-CM | POA: Diagnosis present

## 2013-06-05 DIAGNOSIS — J111 Influenza due to unidentified influenza virus with other respiratory manifestations: Secondary | ICD-10-CM

## 2013-06-05 LAB — CBC
HCT: 21.2 % — ABNORMAL LOW (ref 36.0–46.0)
Hemoglobin: 6.9 g/dL — CL (ref 12.0–15.0)
MCH: 28.4 pg (ref 26.0–34.0)
MCHC: 32.5 g/dL (ref 30.0–36.0)
MCV: 87.2 fL (ref 78.0–100.0)
Platelets: 142 10*3/uL — ABNORMAL LOW (ref 150–400)
RBC: 2.43 MIL/uL — ABNORMAL LOW (ref 3.87–5.11)
RDW: 18.8 % — ABNORMAL HIGH (ref 11.5–15.5)
WBC: 22.5 10*3/uL — ABNORMAL HIGH (ref 4.0–10.5)

## 2013-06-05 LAB — COMPREHENSIVE METABOLIC PANEL
ALT: 10 U/L (ref 0–35)
AST: 26 U/L (ref 0–37)
Albumin: 1 g/dL — ABNORMAL LOW (ref 3.5–5.2)
Alkaline Phosphatase: 60 U/L (ref 39–117)
BUN: 38 mg/dL — ABNORMAL HIGH (ref 6–23)
CALCIUM: 8.6 mg/dL (ref 8.4–10.5)
CO2: 15 meq/L — AB (ref 19–32)
Chloride: 126 mEq/L — ABNORMAL HIGH (ref 96–112)
Creatinine, Ser: 1.6 mg/dL — ABNORMAL HIGH (ref 0.50–1.10)
GFR calc Af Amer: 32 mL/min — ABNORMAL LOW (ref >90)
GFR, EST NON AFRICAN AMERICAN: 28 mL/min — AB (ref >90)
Glucose, Bld: 90 mg/dL (ref 70–99)
POTASSIUM: 3.2 meq/L — AB (ref 3.7–5.3)
SODIUM: 156 meq/L — AB (ref 137–147)
Total Bilirubin: 0.5 mg/dL (ref 0.3–1.2)
Total Protein: 5.8 g/dL — ABNORMAL LOW (ref 6.0–8.3)

## 2013-06-05 LAB — SALICYLATE LEVEL: Salicylate Lvl: <2 mg/dL — ABNORMAL LOW (ref 2.8–20.0)

## 2013-06-05 LAB — DIFFERENTIAL
BASOS ABS: 0 10*3/uL (ref 0.0–0.1)
Basophils Relative: 0 % (ref 0–1)
Eosinophils Absolute: 0 10*3/uL (ref 0.0–0.7)
Eosinophils Relative: 0 % (ref 0–5)
LYMPHS ABS: 1.6 10*3/uL (ref 0.7–4.0)
Lymphocytes Relative: 7 % — ABNORMAL LOW (ref 12–46)
Monocytes Absolute: 0.5 10*3/uL (ref 0.1–1.0)
Monocytes Relative: 2 % — ABNORMAL LOW (ref 3–12)
NEUTROS ABS: 20.4 10*3/uL — AB (ref 1.7–7.7)
Neutrophils Relative %: 91 % — ABNORMAL HIGH (ref 43–77)
WBC Morphology: INCREASED

## 2013-06-05 LAB — LEGIONELLA ANTIGEN, URINE: Legionella Antigen, Urine: NEGATIVE

## 2013-06-05 LAB — PROTIME-INR
INR: 2.64 — AB (ref 0.00–1.49)
Prothrombin Time: 27.3 seconds — ABNORMAL HIGH (ref 11.6–15.2)

## 2013-06-05 LAB — HEMOGLOBIN AND HEMATOCRIT, BLOOD
HCT: 24.5 % — ABNORMAL LOW (ref 36.0–46.0)
HEMOGLOBIN: 7.8 g/dL — AB (ref 12.0–15.0)

## 2013-06-05 LAB — PROCALCITONIN: Procalcitonin: 8.08 ng/mL

## 2013-06-05 LAB — STREP PNEUMONIAE URINARY ANTIGEN: STREP PNEUMO URINARY ANTIGEN: NEGATIVE

## 2013-06-05 LAB — MRSA PCR SCREENING: MRSA BY PCR: NEGATIVE

## 2013-06-05 LAB — LACTIC ACID, PLASMA: Lactic Acid, Venous: 3.2 mmol/L — ABNORMAL HIGH (ref 0.5–2.2)

## 2013-06-05 MED ORDER — OSELTAMIVIR PHOSPHATE 6 MG/ML PO SUSR
30.0000 mg | Freq: Two times a day (BID) | ORAL | Status: DC
  Administered 2013-06-05: 30 mg
  Filled 2013-06-05 (×2): qty 5

## 2013-06-05 MED ORDER — DEXTROSE 5 % IV SOLN
1.0000 g | Freq: Three times a day (TID) | INTRAVENOUS | Status: DC
  Administered 2013-06-05: 1 g via INTRAVENOUS

## 2013-06-05 MED ORDER — OSELTAMIVIR PHOSPHATE 6 MG/ML PO SUSR
75.0000 mg | Freq: Two times a day (BID) | ORAL | Status: DC
  Filled 2013-06-05 (×2): qty 12.5

## 2013-06-05 MED ORDER — WARFARIN SODIUM 1 MG PO TABS
1.0000 mg | ORAL_TABLET | Freq: Every day | ORAL | Status: DC
  Administered 2013-06-05: 1 mg
  Filled 2013-06-05 (×2): qty 1

## 2013-06-05 MED ORDER — VANCOMYCIN HCL IN DEXTROSE 750-5 MG/150ML-% IV SOLN
750.0000 mg | INTRAVENOUS | Status: DC
  Filled 2013-06-05: qty 150

## 2013-06-05 MED ORDER — SODIUM CHLORIDE 0.9 % IV SOLN
INTRAVENOUS | Status: DC
  Administered 2013-06-05: 1000 mL via INTRAVENOUS
  Administered 2013-06-06: 05:00:00 via INTRAVENOUS

## 2013-06-05 MED ORDER — OSELTAMIVIR PHOSPHATE 6 MG/ML PO SUSR
30.0000 mg | Freq: Every day | ORAL | Status: AC
  Administered 2013-06-06 – 2013-06-09 (×4): 30 mg
  Filled 2013-06-05 (×4): qty 5

## 2013-06-05 MED ORDER — AMIODARONE HCL 200 MG PO TABS: 200.0000 mg | ORAL_TABLET | Freq: Two times a day (BID) | ORAL | Status: DC

## 2013-06-05 MED ORDER — WARFARIN - PHYSICIAN DOSING INPATIENT
Freq: Every day | Status: DC
  Administered 2013-06-05: 18:00:00

## 2013-06-05 MED ORDER — AMIODARONE HCL 200 MG PO TABS
200.0000 mg | ORAL_TABLET | Freq: Two times a day (BID) | ORAL | Status: DC
  Administered 2013-06-05 – 2013-06-07 (×6): 200 mg
  Filled 2013-06-05 (×8): qty 1

## 2013-06-05 MED ORDER — AMIODARONE HCL 200 MG PO TABS
200.0000 mg | ORAL_TABLET | Freq: Two times a day (BID) | ORAL | Status: DC
  Administered 2013-06-05: 200 mg via ORAL
  Filled 2013-06-05 (×3): qty 1

## 2013-06-05 MED ORDER — AMIODARONE PEDIATRIC ORAL SUSPENSION 5 MG/ML
200.0000 mg | Freq: Two times a day (BID) | ORAL | Status: DC
  Filled 2013-06-05 (×2): qty 40

## 2013-06-05 MED ORDER — POTASSIUM CHLORIDE 10 MEQ/100ML IV SOLN
10.0000 meq | INTRAVENOUS | Status: AC
  Administered 2013-06-05 (×4): 10 meq via INTRAVENOUS
  Filled 2013-06-05: qty 100

## 2013-06-05 MED ORDER — OSELTAMIVIR PHOSPHATE 75 MG PO CAPS: 75.0000 mg | ORAL_CAPSULE | Freq: Two times a day (BID) | ORAL | Status: DC

## 2013-06-05 MED ORDER — WARFARIN SODIUM 1 MG PO TABS
1.0000 mg | ORAL_TABLET | Freq: Every day | ORAL | Status: DC
  Filled 2013-06-05: qty 1

## 2013-06-05 NOTE — Progress Notes (Addendum)
Clinical Social Work Department BRIEF PSYCHOSOCIAL ASSESSMENT 06/05/2013  Patient:  Lisa Nixon, Lisa Nixon     Account Number:  [id number]     Admit date:  06/03/2013  Clinical Social Worker:  Lisa Nixon  Date/Time:  06/05/2013 04:49 PM  Referred by:  Physician  Date Referred:  06/05/2013 Referred for  SNF Placement   Other Referral:   Interview type:  Family Other interview type:    PSYCHOSOCIAL DATA Living Status:  FACILITY Admitted from facility:  [facility] Level of care:  [place] Primary support name:  Lisa Nixon ([id number]) Primary support relationship to patient:  SPOUSE Degree of support available:   Good--pt's spouse and adult children provide support to pt.    CURRENT CONCERNS Current Concerns  Post-Acute Placement   Other Concerns:    SOCIAL WORK ASSESSMENT / PLAN CSW received voicemail from pt's son Lisa Nixon. Son confirmed pt is from [facility]. CSW called pt's son back and left him a voicemail letting him know CSW got his message and that CSW has informed Lisa Nixon that pt is on CSW's unit. CSW has started paperwork for pt's return to SNF--[facility] is on the chart.  Addendum: CSW received call from pt's son Lisa Nixon, Lisa Nixon explains family is interested in residential hospice placement for pt, and CSW explained requirements for residential hospice placement (palliative medicine team to be involved with pt while she is in the hospital, and residential hospice facilities will take pt if it is determined they have only a few weeks of life remaining). Lisa Nixon understanding of requirements; CSW explained pt might still be eligible for hospice services at home or at a SNF if it is deemed she has 6 months of life or less. Lisa Nixon explained if pt has to go to SNF, the family would prefer she not return to [facility]. CSW explained CSW will ask for palliative care consult so  the family can discuss goals of care, and CSW will send her information to the other 43 SNFs in [facility] explaining family is interested in a comfort care approach. Lisa Nixon thanked [facility], and explained the family has considered Lisa Nixon in the past. CSW will send a message to this facility separately stating they are one of the preferences for placement with palliative following her. CSW will update Lisa Nixon when CSW hears back from SNFs.   Assessment/plan status:  Psychosocial Support/Ongoing Assessment of Needs Other assessment/ plan:   Information/referral to community resources:   SNF [facility])    PATIENT'S/FAMILY'S RESPONSE TO PLAN OF CARE: Good--pt's son friendly in voicemail message, and CSW invited him to call back if CSW can provide additional support or if they have any questions.  AddendumShelly Nixon friendly on the phone and expressed gratitude for CSW assistance. Explained the family has not been happy with [facility] and the family wants to focus on residential hospice placement if this is possible. Lisa Nixon expressed understanding of requirements for residential hospice, and if pt does not qualify family is open to SNF placement at another facility with palliative medicine following her. CSW text-paged MD requesting palliative care consult for pt. CSW will update Lisa Nixon further when facilities respond to bed request.       Lisa Nixon, MSW, [facility] Clinical Social Worker [phone]

## 2013-06-05 NOTE — Plan of Care (Cosign Needed)
Note since admit Flu PCR positive for A. Will start Tamiflu. Still tachycardic so have increased IVFs to 150/hr for now and re-ordered am labs as stat.Had normal EF per ECHO Oct 2014.  Erin Hearing, ANP

## 2013-06-05 NOTE — Progress Notes (Signed)
Attempted to contact patient's husband, son and daughter using all of the numbers listed in Epic. No response. Left message on son's home number to call back as soon as possible.   Debbe Odea, MD

## 2013-06-05 NOTE — Care Management Note (Addendum)
    Page 1 of 1   06/05/2013     3:16:14 PM   CARE MANAGEMENT NOTE 06/05/2013  Patient:  Lisa Nixon, Lisa Nixon   Account Number:  [id number]  Date Initiated:  06/05/2013  Documentation initiated by:  Elissa Hefty  Subjective/Objective Assessment:   adm w sepsis     Action/Plan:   lives at nsg facility, pcp dr Einar Gip   Anticipated DC Date:     Anticipated DC Plan:  [facility] referral  Clinical Social Worker      DC Planning Services  CM consult      Choice offered to / List presented to:             Status of service:   Medicare Important Message given?   (If response is "NO", the following Medicare IM given date fields will be blank) Date Medicare IM given:   Date Additional Medicare IM given:    Discharge Disposition:  [place]  Per UR Regulation:  Reviewed for med. necessity/level of care/duration of stay  If discussed at [place] of Stay Meetings, dates discussed:    Comments:

## 2013-06-05 NOTE — Progress Notes (Signed)
INITIAL NUTRITION ASSESSMENT  DOCUMENTATION CODES Per approved criteria  -Severe malnutrition in the context of chronic illness.    INTERVENTION: 1.  Enteral nutrition; if warranted and appropriate, resume TFs of Osmolite 1.2  @ 20 mL/hr continuous.  Advance by 10 mL q 4 hrs to 50 mL/hr goal to provide 1440 kcal, 66g protein, 1200 mL free water.  NUTRITION DIAGNOSIS: Inadequate oral intake related to AMS, poor appetite AEB previous wt loss, poor nutrition hx  Monitor:  1.  Enteral nutrition; initiation with tolerance if appropriate per MD.  Pt to meet >/=90% estimated needs with nutrition support.  2.  Wt/wt change; monitor trends 3.  GOC; nutrition-related interventions to remain consistent with GOC/EOL wishes  Reason for Assessment: Malnutrition Screening Tool, score of 5  80 y.o. female  Admitting Dx: FTT, AMS  ASSESSMENT: Pt admitted with AMS and progressive FTT for 6 months.  Pt had a PEG tube placed (1/30) and has been receiving Osmolite via tube.  There is both Osmolite 1.2 and Osmolite 1.5 documented in medical hx provided by facility.  Pt with continued decline.  Per MD note, in the past pt/family have wanted full, aggressive care for pt. Now with PNA and positive flu.   Pt with several recent hospitalizations.  She has been found to meet criteria for severe malnutrition based on wasting, poor PO intake, and h/o of significant wt loss.  This dx is ongoing.   TFs have not been resumed at this time. Per MD note, discussion with family needs to be held re: possible EOL wishes.   Height: Ht Readings from Last 1 Encounters:  06/05/13 5\' 7"  (1.702 m)    Weight: Wt Readings from Last 1 Encounters:  06/05/13 123 lb 14.4 oz (56.2 kg)    Ideal Body Weight: 120 lbs  % Ideal Body Weight: 95%  Wt Readings from Last 10 Encounters:  06/05/13 123 lb 14.4 oz (56.2 kg)  06/01/13 119 lb (53.978 kg)  04/16/13 119 lb 4.8 oz (54.114 kg)  03/23/13 100 lb 14.4 oz (45.768 kg)   02/22/13 113 lb 5.1 oz (51.4 kg)    Usual Body Weight: 113 lbs  % Usual Body Weight: 100%  BMI:  Body mass index is 19.4 kg/(m^2).  Estimated Nutritional Needs: Kcal: 3220-2542 Protein: 60-70 grams Fluid: 1.3-1.5 L/day  Skin: stage 2 pressure ulcer, generalized edema  Diet Order: NPO  EDUCATION NEEDS: -No education needs identified at this time   Intake/Output Summary (Last 24 hours) at 06/05/13 1430 Last data filed at 06/05/13 0800  Gross per 24 hour  Intake   1440 ml  Output    750 ml  Net    690 ml    Last BM: 2/3  Labs:   Recent Labs Lab 06/01/13 1248 06/25/2013 1813 06/05/13 1120  NA 153* 157* 156*  K 4.1 5.5* 3.2*  CL 118* 122* 126*  CO2 21 18* 15*  BUN 32* 38* 38*  CREATININE 1.86* 1.67* 1.60*  CALCIUM 10.8* 9.5 8.6  GLUCOSE 67* 96 90    CBG (last 3)  No results found for this basename: GLUCAP,  in the last 72 hours  Scheduled Meds: . amiodarone  200 mg Per Tube BID  . ceFEPime (MAXIPIME) IV  1 g Intravenous Q12H  . oseltamivir  30 mg Per Tube BID  . [START ON 06/06/2013] vancomycin  750 mg Intravenous Q48H  . warfarin  1 mg Per Tube q1800  . Warfarin - Physician Dosing Inpatient   Does not  apply q1800    Continuous Infusions:    Past Medical History  Diagnosis Date  . Atrial fibrillation   . Hyperlipemia   . GERD (gastroesophageal reflux disease)   . Hypertension   . Breast cancer   . Angina at rest   . Myocardial infarction 2008    Dr Einar Gip  . Dementia oct 2014    memory issues since oct 2014  . Constipation   . Renal disorder     Acute kidney failure    Past Surgical History  Procedure Laterality Date  . Breast surgery    . Lymphadenectomy    . Hiatal hernia repair    . Cesarean section      Brynda Greathouse, MS RD LDN Clinical Inpatient Dietitian Pager: [phone] Weekend/After hours pager: [phone]

## 2013-06-05 NOTE — Progress Notes (Signed)
Pt disoriented, so CSW called pt's spouse and left a voicemail explaining CSW role in discharge back to SNF. CSW requested a return call so CSW can verify pt is from [facility] and family wants her to return. CSW called pt's son and left a voicemail with this same message. CSW will complete full assessment when CSW receives a return call from family.   [name], MSW, [facility] Clinical Social Worker [phone]

## 2013-06-05 NOTE — Progress Notes (Signed)
ANTIBIOTIC CONSULT NOTE - INITIAL  Pharmacy Consult for vancomycin Indication: rule out pneumonia  Allergies  Allergen Reactions  . Penicillins Nausea And Vomiting    "almost pass out"    Patient Measurements: Height: 5' 4.17" (163 cm) Weight: 119 lb 0.8 oz (54 kg) IBW/kg (Calculated) : 55.1  Vital Signs: Temp: 98.5 F (36.9 C) (02/03 0013) Temp src: Oral (02/03 0013) BP: 116/58 mmHg (02/03 0013) Pulse Rate: 61 (02/03 0013)  Labs:  Recent Labs  06/22/2013 1813  WBC 22.1*  HGB 8.7*  PLT 180  CREATININE 1.67*   Estimated Creatinine Clearance: 19.9 ml/min (by C-G formula based on Cr of 1.67).   Microbiology: No results found for this or any previous visit (from the past 720 hour(s)).  Medical History: Past Medical History  Diagnosis Date  . Atrial fibrillation   . Hyperlipemia   . GERD (gastroesophageal reflux disease)   . Hypertension   . Breast cancer   . Angina at rest   . Myocardial infarction 2008    Dr Einar Gip  . Dementia oct 2014    memory issues since oct 2014  . Constipation   . Renal disorder     Acute kidney failure    Medications:  Prescriptions prior to admission  Medication Sig Dispense Refill  . amiodarone (PACERONE) 400 MG tablet Take 0.5 tablets (200 mg total) by mouth 2 (two) times daily.  60 tablet  0  . cholecalciferol (VITAMIN D) 1000 UNITS tablet Take 1,000 Units by mouth daily.      Marland Kitchen diltiazem (DILACOR XR) 240 MG 24 hr capsule Take 240 mg by mouth every morning.      Marland Kitchen ENSURE PLUS (ENSURE PLUS) LIQD Take 237 mLs by mouth 2 (two) times daily between meals.      . Fe Fum-FePoly-Vit C-Vit B3 (INTEGRA PO) Take 1 capsule by mouth daily.      Marland Kitchen lactulose (CHRONULAC) 10 GM/15ML solution Take 30 mLs (20 g total) by mouth 2 (two) times daily as needed for mild constipation.  240 mL  0  . megestrol (MEGACE) 400 MG/10ML suspension Take 10 mLs (400 mg total) by mouth 2 (two) times daily.  240 mL  0  . metoprolol succinate (TOPROL-XL) 50 MG 24 hr  tablet Take 1 tablet (50 mg total) by mouth 2 (two) times daily. Take with or immediately following a meal.      . mirtazapine (REMERON) 15 MG tablet Take 15 mg by mouth at bedtime.      Marland Kitchen oxyCODONE-acetaminophen (PERCOCET/ROXICET) 5-325 MG per tablet Take 1 tablet by mouth every 6 (six) hours as needed for moderate pain.  30 tablet  0  . pantoprazole (PROTONIX) 40 MG tablet Take 40 mg by mouth daily.      . polyethylene glycol (MIRALAX / GLYCOLAX) packet Take 17 g by mouth daily.  14 each  0  . PRESCRIPTION MEDICATION Take 120 mLs by mouth 3 (three) times daily. Med Plus      . senna-docusate (SENOKOT-S) 8.6-50 MG per tablet Take 2 tablets by mouth 2 (two) times daily.  60 tablet  0  . vitamin B-12 (CYANOCOBALAMIN) 1000 MCG tablet Take 1,000 mcg by mouth daily.      Marland Kitchen warfarin (COUMADIN) 1 MG tablet Take 1 mg by mouth every morning.       Scheduled:  . amiodarone  200 mg Oral BID  . ceFEPime (MAXIPIME) IV  1 g Intravenous Q12H  . warfarin  1 mg Oral q morning -  10a   Infusions:  . sodium chloride 100 mL/hr at 06/16/2013 2312    Assessment: 80yo female was recently discharged to care center, now w/ labored breathing, found in ED to be hypotensive, CXR concerning for atelectasis vs PNA, to begin IV ABX; family considering hospice if no response to PNA tx.  Goal of Therapy:  Vancomycin trough level 15-20 mcg/ml  Plan:  Rec'd vanc 1g in ED; will continue with vancomycin 750mg  IV Q48H and monitor CBC, Cx, CrCl, levels prn.  Wynona Neat, PharmD, BCPS  06/05/2013,12:15 AM

## 2013-06-05 NOTE — Progress Notes (Signed)
CRITICAL VALUE ALERT  Critical value received: hemoglobin 6.9  Date of notification:  06/05/2013   Time of notification:  7903 PM   Critical value read back:yes  Nurse who received alert:  Donnella Bi, RN  MD notified (1st page):  Dr. Wynelle Cleveland  Time of first page:  1230  MD notified (2nd page): Ms. Erin Hearing  Time of second page: 1535  Responding MD: ms. Erin Hearing  Time MD responded:  [phone]

## 2013-06-05 NOTE — Progress Notes (Signed)
L[facility] Nixon - Stepdown / ICU Progress Note  OYUKY STRANAHAN HGD:[id number] DOB: [birth year] DOA: 06/19/2013 PCP: Pamella Pert, MD  Brief narrative: 80 year old female patient with multiple medical problems. Resides at skilled nursing facility. Recent issues with progressive failure to thrive for at least 6 months PEG tube placed in the past week. Presented to the ER with progressive altered mentation fever and shortness of breath. Arrived to the emergency department in an unresponsive state. Patient was hypotensive in the emergency department and was given multiple fluid challenges up to 3 L. In addition she was tachycardic with heart rate in the 140s and a temperature of 103. Family was not available at time of the admitting physician's assessment. At some point prior to this presentation at the last family visit there was some mention of discussion and counseling regarding palliative care at that time the family felt the patient should still be a full code.  Assessment/Plan: Active Problems:   Sepsis -From influenza A and possibly secondary pneumonia -Continue supportive care and antibiotics -BP currently stable- status is also very tenuous and suspect we'll need to readdress CODE STATUS with family during this hospitalization    Acute respiratory failure with hypoxia:   A) Hospital acquired PNA   B) Influenza A - poor prognostic indicator for patient this physiologically debilitated -Tamiflu initiated 06/05/13 -Continue empiric broad spectrum antibiotics -Continue supportive care    Atrial fibrillation with RVR -RVR reflective of dehydration and physiologic response to fever and acute infectious stress -Continues to spike heart rates up to the 140s when stimulated -Continue amiodarone -Warfarin per pharmacy watching INR given concomitant use of antibiotic    Acute renal failure/CKD 2 -Continue to hydrate and avoid nephrotoxic medication    Diabetes -Not on medications  preadmission and glucose actually somewhat low at presentation -Follow CBG every 4 hours    Hypernatremia/Dehydration -Continue IV fluid but increased rate 250 an hour as noted above specially in setting of acute flu and fevers  Hypokalemia -replace    Protein-calorie malnutrition, severe -Nutrition consult for tube feeding -Give all medications prior tube    History of pulmonary embolism    Failure to thrive syndrome, adult, likely secondary to multiple debilitating illnesses -As above -Given patient's audible comorbidities and significant physiologic decline prior to onset of influenza and likely pneumonia there is a high likelihood that this is a sentinel event for this patient and she will not survive the hospitalization-M.D. to discuss with family and we will need to clarify CODE STATUS this admission as well    Myelodysplasia?? with leukocytosis and thrombocytosis -Reveals had persistent reactive leukocytosis with baseline around 10-11 pounds in but currently is 22,000 which is indicative of acute infectious process -Hemoglobin stable around baseline    Poor venous access -To have adequate peripheral access but due to patient's poor venous access in setting of severe dehydration unable to repeat labs -Will need to discuss with family and if continue to pursue aggressive treatments will need to have either central line or PICC line placed   Sacral decubitus -Air mattress   DVT prophylaxis: Warfarin Code Status: Full Family Communication: No family at bedside Disposition Plan/Expected LOS: Step down   Consultants: None  Procedures: None  Antibiotics: Cefepime 2/2 >>> Vancomycin 2/2 >>> Tamiflu 2/3 >>>  HPI/Subjective: Patient awake and at times appears to make eye contact but nonverbal. Becomes very uncomfortable with repositioning in bed.  Objective: Blood pressure 123/74, pulse 116, temperature 97.3 F (36.3 C), temperature source  Axillary, resp. rate 24,  height 5\' 7"  (Nixon.702 m), weight 123 lb 14.4 oz (56.2 kg), SpO2 97.00%.  Intake/Output Summary (Last 24 hours) at 06/05/13 1045 Last data filed at 06/05/13 0800  Gross per 24 hour  Intake   1440 ml  Output    750 ml  Net    690 ml     Exam: General: No acute respiratory distress-cachectic, chronically ill as well as acutely toxic-appearing female Lungs: Coarse to auscultation bilaterally without wheezes or crackles, 3 L Cardiovascular: Irregular tachycardic rate and rhythm without murmur gallop or rub normal S1 and S2, no peripheral edema or JVD Abdomen: Nontender, nondistended, soft, bowel sounds positive, no rebound, no ascites, no appreciable mass Musculoskeletal: No significant cyanosis, clubbing of bilateral lower extremities Neurological: Awakens, nonverbal but does not in with repositioning, seems to make some eye contact  Scheduled Meds:  Scheduled Meds: . amiodarone  200 mg Per Tube BID  . ceFEPime (MAXIPIME) IV  Nixon g Intravenous Q12H  . oseltamivir  30 mg Per Tube BID  . [START ON 06/06/2013] vancomycin  750 mg Intravenous Q48H  . warfarin  Nixon mg Per Tube q1800  . Warfarin - Physician Dosing Inpatient   Does not apply q1800   Continuous Infusions: . sodium chloride Nixon,000 mL (06/05/13 1022)    Data Reviewed: Basic Metabolic Panel:  Recent Labs Lab 06/01/13 1248 06/07/2013 1813  NA 153* 157*  K 4.Nixon 5.5*  CL 118* 122*  CO2 21 18*  GLUCOSE 67* 96  BUN 32* 38*  CREATININE Nixon.86* Nixon.67*  CALCIUM 10.8* 9.5   Liver Function Tests:  Recent Labs Lab 06/06/2013 1813  AST 62*  ALT 14  ALKPHOS 70  BILITOT 0.4  PROT 7.5  ALBUMIN Nixon.4*   No results found for this basename: LIPASE, AMYLASE,  in the last 168 hours No results found for this basename: AMMONIA,  in the last 168 hours CBC:  Recent Labs Lab 06/01/13 1248 06/17/2013 1813  WBC 13.0* 22.Nixon*  NEUTROABS  --  19.3*  HGB 8.9* 8.7*  HCT 28.5* 27.9*  MCV 90.2 88.9  PLT 262 180   Cardiac Enzymes: No results  found for this basename: CKTOTAL, CKMB, CKMBINDEX, TROPONINI,  in the last 168 hours BNP (last 3 results)  Recent Labs  02/18/13 0430 04/10/13 1643  PROBNP 16447.0* 15766.0*   CBG: No results found for this basename: GLUCAP,  in the last 168 hours  Recent Results (from the past 240 hour(s))  MRSA PCR SCREENING     Status: None   Collection Time    06/05/13 12:25 AM      Result Value Range Status   MRSA by PCR NEGATIVE  NEGATIVE Final   Comment:            The GeneXpert MRSA Assay (FDA     approved for NASAL specimens     only), is one component of a     comprehensive MRSA colonization     surveillance program. It is not     intended to diagnose MRSA     infection nor to guide or     monitor treatment for     MRSA infections.     Studies:  Recent x-ray studies have been reviewed in detail by the Attending Physician  Time spent :     Erin Hearing, [place] [facility] Office  [phone] Pager [phone]  **If unable to reach the above provider after paging please contact the [place] @ [phone]  On-Call/Text Page:      [email or web address]      password [email or web address]  If 7PM-7AM, please contact night-coverage [email or web address] Password [email or web address] 06/05/2013, 10:45 AM   LOS: Nixon day   I have examined the patient, reviewed the chart and modified the above note which I agree with.   Hjalmar Ballengee,MD [phone] 06/05/2013, 3:17 PM

## 2013-06-06 LAB — CBC
HEMATOCRIT: 21.2 % — AB (ref 36.0–46.0)
HEMOGLOBIN: 6.8 g/dL — AB (ref 12.0–15.0)
MCH: 27.5 pg (ref 26.0–34.0)
MCHC: 32.1 g/dL (ref 30.0–36.0)
MCV: 85.8 fL (ref 78.0–100.0)
Platelets: 160 10*3/uL (ref 150–400)
RBC: 2.47 MIL/uL — AB (ref 3.87–5.11)
RDW: 18.9 % — ABNORMAL HIGH (ref 11.5–15.5)
WBC: 18.8 10*3/uL — ABNORMAL HIGH (ref 4.0–10.5)

## 2013-06-06 LAB — BASIC METABOLIC PANEL
BUN: 39 mg/dL — AB (ref 6–23)
CHLORIDE: 127 meq/L — AB (ref 96–112)
CO2: 15 meq/L — AB (ref 19–32)
Calcium: 8.7 mg/dL (ref 8.4–10.5)
Creatinine, Ser: 1.61 mg/dL — ABNORMAL HIGH (ref 0.50–1.10)
GFR calc non Af Amer: 27 mL/min — ABNORMAL LOW (ref >90)
GFR, EST AFRICAN AMERICAN: 32 mL/min — AB (ref >90)
Glucose, Bld: 62 mg/dL — ABNORMAL LOW (ref 70–99)
POTASSIUM: 3.7 meq/L (ref 3.7–5.3)
Sodium: 156 mEq/L — ABNORMAL HIGH (ref 137–147)

## 2013-06-06 LAB — URINE CULTURE
COLONY COUNT: NO GROWTH
CULTURE: NO GROWTH

## 2013-06-06 LAB — MAGNESIUM: Magnesium: 1.9 mg/dL (ref 1.5–2.5)

## 2013-06-06 LAB — PROCALCITONIN: PROCALCITONIN: 7.99 ng/mL

## 2013-06-06 LAB — PROTIME-INR
INR: 3.66 — ABNORMAL HIGH (ref 0.00–1.49)
Prothrombin Time: 35 seconds — ABNORMAL HIGH (ref 11.6–15.2)

## 2013-06-06 MED ORDER — SODIUM CHLORIDE 0.45 % IV SOLN
INTRAVENOUS | Status: DC
  Administered 2013-06-06: 11:00:00 via INTRAVENOUS

## 2013-06-06 MED ORDER — JEVITY 1.2 CAL PO LIQD
1000.0000 mL | ORAL | Status: DC
  Administered 2013-06-06: 1000 mL
  Filled 2013-06-06 (×2): qty 1000

## 2013-06-06 MED ORDER — MORPHINE SULFATE 2 MG/ML IJ SOLN
2.0000 mg | Freq: Once | INTRAMUSCULAR | Status: AC
  Administered 2013-06-06: 2 mg via INTRAVENOUS
  Filled 2013-06-06: qty 1

## 2013-06-06 MED ORDER — FREE WATER
250.0000 mL | Freq: Four times a day (QID) | Status: DC
  Administered 2013-06-06 – 2013-06-07 (×5): 250 mL

## 2013-06-06 MED ORDER — DEXTROSE 5 % IV SOLN
1.0000 g | INTRAVENOUS | Status: DC
  Filled 2013-06-06: qty 1

## 2013-06-06 NOTE — Progress Notes (Signed)
MD is aware of patient's rectal temp of 93.4 and being a difficult stick.  New orders noted.

## 2013-06-06 NOTE — Progress Notes (Signed)
CRITICAL VALUE ALERT  Critical value received:hemoglobin 6.8  Date of notification:  06/06/2013   Time of notification:  0855   Critical value read back:yes  Nurse who received alert: Donnella Bi, RN  MD notified (1st page):  Dr. Thereasa Solo  Time of first page: [phone]  MD notified (2nd page):  Time of second page:  Responding MD:    Time MD responded:

## 2013-06-06 NOTE — Progress Notes (Addendum)
[facility]  SUNG RENTON VOZ:[id number] DOB: [birth year] DOA: 06/21/2013 PCP: Laverda Page, MD  Brief narrative: 80 year old female patient with multiple medical problems. Resides at skilled nursing facility. Recent issues with progressive failure to thrive for at least 6 months PEG tube placed in the past week. Presented to the ER with progressive altered mentation fever and shortness of breath. Arrived to the emergency department in an unresponsive state. Patient was hypotensive in the emergency department and was given multiple fluid challenges up to 3 L. In addition she was tachycardic with heart rate in the 140s and a temperature of 103. Family was not available at time of the admitting physician's assessment. At some point prior to this presentation at the last family visit there was some mention of discussion and counseling regarding palliative care at that time the family felt the patient should still be a full code.  Assessment/Plan:    Sepsis -From influenza A and possibly secondary pneumonia -Continue supportive care and antibiotics -BP currently stable- status is also very tenuous and suspect we'll need to readdress CODE STATUS with family during this hospitalization-family called our office and requested formal Palliative evaluation/contacted    Acute respiratory failure with hypoxia:   A) Hospital acquired PNA   B) Influenza A - poor prognostic indicator for patient this physiologically debilitated -Tamiflu initiated 06/05/13 -Continue empiric broad spectrum antibiotics for now -Continue supportive care    Atrial fibrillation with RVR -RVR reflective of dehydration and physiologic response to fever and acute infectious stress -rates stable low 100s -Continue amiodarone per tube -Warfarin per pharmacy watching INR given concomitant use of antibiotic   Acute on chronic anemia -with hydration hgb has drifted down to 6.8- no signs  active bleeding and hemodynamically stable- has poor venous access and since plan is for palliative eval will defer transfusion for now.    Acute renal failure/CKD 2 -Continue to hydrate but decrease rate to 50/hr and avoid nephrotoxic medication    Diabetes -Not on medications preadmission and glucose actually somewhat low at presentation -Follow CBG every 4 hours    Hypernatremia/Dehydration -Continue IV fluid but change to 1/2 NS and begin 250 cc free water per tube  Hypokalemia -replace prn    Protein-calorie malnutrition, severe -begin Jevity at 20 cc/hr and slow titrate up since active Flu Serum albumin low at 1.0 which is poor prognostic indicator -Give all medications prior tube    History of pulmonary embolism    Failure to thrive syndrome, adult, likely secondary to multiple debilitating illnesses -As above -Palliative eval pending -we rec change to DNR status and comfort based approach - prognosis is poor   Poor venous access -defer PICC placement at this time (see above)   Sacral decubitus -Air mattress  DVT prophylaxis: Warfarin Code Status: Full Family Communication: No family at bedside but family did call our office to request Palliative eval Disposition Plan/Expected LOS: Step down  Consultants: Palliative Care  Procedures: None  Antibiotics: Cefepime 2/2 >>> Vancomycin 2/2 >>> Tamiflu 2/3 >>>  HPI/Subjective: Patient more awake- now restless - moans- no discernable words. Does make eye contact.  Objective: Blood pressure 113/81, pulse 102, temperature 97.2 F (36.2 C), temperature source Oral, resp. rate 30, height 5\' 7"  (1.702 m), weight 123 lb 14.4 oz (56.2 kg), SpO2 100.00%.  Intake/Output Summary (Last 24 hours) at 06/06/13 1111 Last data filed at 06/06/13 0900  Gross per 24 hour  Intake 2518.33 ml  Output  700 ml  Net 1818.33 ml   Exam: General: No acute respiratory distress-cachectic, chronically ill as well as acutely  toxic-appearing female Lungs: Coarse to auscultation bilaterally without wheezes or crackles, 3 L Cardiovascular: Irregular tachycardic rate and rhythm without murmur gallop or rub normal S1 and S2, no peripheral edema or JVD Abdomen: Nontender, nondistended, soft, bowel sounds positive, no rebound, no ascites, no appreciable mass Musculoskeletal: No significant cyanosis, clubbing of bilateral lower extremities Neurological: Awake and restless- unable to effectively verbally communicate  Scheduled Meds:  Scheduled Meds: . amiodarone  200 mg Per Tube BID  . [START ON 06/07/2013] ceFEPime (MAXIPIME) IV  1 g Intravenous Q24H  . free water  250 mL Per Tube Q6H  . oseltamivir  30 mg Per Tube Daily  . vancomycin  750 mg Intravenous Q48H   Continuous Infusions: . sodium chloride 50 mL/hr at 06/06/13 1056  . feeding supplement (JEVITY 1.2 CAL)      Data Reviewed: Basic Metabolic Panel:  Recent Labs Lab 06/01/13 1248 06/19/2013 1813 06/05/13 1120 06/06/13 0830  NA 153* 157* 156* 156*  K 4.1 5.5* 3.2* 3.7  CL 118* 122* 126* 127*  CO2 21 18* 15* 15*  GLUCOSE 67* 96 90 62*  BUN 32* 38* 38* 39*  CREATININE 1.86* 1.67* 1.60* 1.61*  CALCIUM 10.8* 9.5 8.6 8.7  MG  --   --   --  1.9   Liver Function Tests:  Recent Labs Lab 06/03/2013 1813 06/05/13 1120  AST 62* 26  ALT 14 10  ALKPHOS 70 60  BILITOT 0.4 0.5  PROT 7.5 5.8*  ALBUMIN 1.4* 1.0*   CBC:  Recent Labs Lab 06/01/13 1248 06/13/2013 1813 06/05/13 1120 06/05/13 1819 06/06/13 0830  WBC 13.0* 22.1* 22.5*  --  18.8*  NEUTROABS  --  19.3* 20.4*  --   --   HGB 8.9* 8.7* 6.9* 7.8* 6.8*  HCT 28.5* 27.9* 21.2* 24.5* 21.2*  MCV 90.2 88.9 87.2  --  85.8  PLT 262 180 142*  --  160    Recent Results (from the past 240 hour(s))  URINE CULTURE     Status: None   Collection Time    06/13/2013  7:08 PM      Result Value Range Status   Specimen Description URINE, CATHETERIZED   Final   Special Requests NONE   Final   Culture   Setup Time     Final   Value: 06/05/2013 04:02     Performed at [place]     Final   Value: NO GROWTH     Performed at [facility]   Culture     Final   Value: NO GROWTH     Performed at [facility]   Report Status 06/06/2013 FINAL   Final  CULTURE, BLOOD (ROUTINE X 2)     Status: None   Collection Time    06/06/2013  8:30 PM      Result Value Range Status   Specimen Description BLOOD ARM LEFT   Final   Special Requests BOTTLES DRAWN AEROBIC AND ANAEROBIC 10CC   Final   Culture  Setup Time     Final   Value: 06/05/2013 04:40     Performed at [facility]   Culture     Final   Value:        BLOOD CULTURE RECEIVED NO GROWTH TO DATE CULTURE WILL BE HELD FOR 5 DAYS BEFORE ISSUING A  FINAL NEGATIVE REPORT     Performed at [facility]   Report Status PENDING   Incomplete  CULTURE, BLOOD (ROUTINE X 2)     Status: None   Collection Time    06/24/2013  8:45 PM      Result Value Range Status   Specimen Description BLOOD ARM RIGHT   Final   Special Requests BOTTLES DRAWN AEROBIC ONLY 4CC   Final   Culture  Setup Time     Final   Value: 06/05/2013 04:41     Performed at [facility]   Culture     Final   Value:        BLOOD CULTURE RECEIVED NO GROWTH TO DATE CULTURE WILL BE HELD FOR 5 DAYS BEFORE ISSUING A FINAL NEGATIVE REPORT     Performed at [facility]   Report Status PENDING   Incomplete  MRSA PCR SCREENING     Status: None   Collection Time    06/05/13 12:25 AM      Result Value Range Status   MRSA by PCR NEGATIVE  NEGATIVE Final   Comment:            The GeneXpert MRSA Assay (FDA     approved for NASAL specimens     only), is one component of a     comprehensive MRSA colonization     surveillance program. It is not     intended to diagnose MRSA     infection nor to guide or     monitor treatment for     MRSA infections.     Studies:  Recent x-ray studies have been reviewed in detail by the  Attending Physician  Time spent : 78mins     Allison Ellis, ANP [facility] Office  [phone] Pager [phone]  **If unable to reach the above provider after paging please contact the [place] @ [phone]  On-Call/Text Page:      [email or web address]      password [email or web address]  If 7PM-7AM, please contact night-coverage [email or web address] Password [email or web address] 06/06/2013, 11:11 AM   LOS: 2 days   I have personally examined this patient and reviewed the entire database. I have reviewed the above note, made any necessary editorial changes, and agree with its content.  Cherene Altes, MD [facility]

## 2013-06-06 NOTE — Progress Notes (Signed)
Thank you for consulting the Palliative Medicine Team at [facility] to meet your patient's and family's needs.   The reason that you asked [place] to see your patient is  For GOC  We have scheduled your patient for a meeting: 2/5 1 pm family request  The Surrogate decision make is: Spouse and son Contact information:[phone]   Other family members that need to be present: Spouse is debilitated. Son [name] is primary care giver and requested time of visit    Your patient is able/unable to participate:  Additional Narrative:   Fed Ceci L. Lovena Le, MD MBA The Palliative Medicine Team at [facility] Phone: [phone] Pager: [phone]

## 2013-06-07 DIAGNOSIS — Z515 Encounter for palliative care: Secondary | ICD-10-CM

## 2013-06-07 LAB — BASIC METABOLIC PANEL
BUN: 49 mg/dL — ABNORMAL HIGH (ref 6–23)
CALCIUM: 9.2 mg/dL (ref 8.4–10.5)
CO2: 18 mEq/L — ABNORMAL LOW (ref 19–32)
CREATININE: 5.14 mg/dL — AB (ref 0.50–1.10)
Chloride: 96 mEq/L (ref 96–112)
GFR calc Af Amer: 8 mL/min — ABNORMAL LOW (ref >90)
GFR calc non Af Amer: 7 mL/min — ABNORMAL LOW (ref >90)
GLUCOSE: 98 mg/dL (ref 70–99)
Potassium: 4.9 mEq/L (ref 3.7–5.3)
Sodium: 139 mEq/L (ref 137–147)

## 2013-06-07 LAB — PROTIME-INR
INR: 1.57 — ABNORMAL HIGH (ref 0.00–1.49)
PROTHROMBIN TIME: 18.3 s — AB (ref 11.6–15.2)

## 2013-06-07 MED ORDER — MORPHINE SULFATE 10 MG/5ML PO SOLN
2.5000 mg | ORAL | Status: DC | PRN
  Administered 2013-06-07: 2.5 mg via ORAL
  Filled 2013-06-07 (×2): qty 5

## 2013-06-07 MED ORDER — MORPHINE SULFATE 10 MG/5ML PO SOLN
2.5000 mg | ORAL | Status: DC | PRN
  Administered 2013-06-08 (×2): 2.5 mg via ORAL
  Filled 2013-06-07 (×2): qty 5

## 2013-06-07 MED ORDER — ACETAMINOPHEN 650 MG RE SUPP: 650.0000 mg | Freq: Four times a day (QID) | RECTAL | Status: DC | PRN

## 2013-06-07 MED ORDER — ATROPINE SULFATE 1 % OP SOLN
4.0000 [drp] | OPHTHALMIC | Status: DC | PRN
  Administered 2013-06-08 – 2013-06-10 (×4): 4 [drp] via SUBLINGUAL
  Filled 2013-06-07: qty 2

## 2013-06-07 MED ORDER — LORAZEPAM 2 MG/ML IJ SOLN: 0.5000 mg | Freq: Four times a day (QID) | INTRAMUSCULAR | Status: DC | PRN

## 2013-06-07 NOTE — Consult Note (Signed)
Patient LQ:[id number] Lisa Nixon      DOB: Mar 20, 1923      ZP:[id number]     Consult Note from the Palliative Medicine Team at [place] Requested by:  Dr. Wynelle Cleveland    PCP: Laverda Page, MD Reason for Consultation:GOc     Phone Number:[phone] Related symptom recommendations Assessment of patients Current state: 80 yr old african [place] female who is known to me from a previous hospital stay.  Was admitted from SNF unresponsive.  The patient underwent PEG placement last week after several months of failure to thrive.  During our last goals of care the patient asserted her desire for full goals of care and willingness to take medications to help her appetite.  At that time she displayed signs that she might have limited capacity but family wanted to support her wishes.  On admission the patient was found to have fever, dehydration and hypotension .  She is positive for influenza A and has a concomitant right lower lobe infiltration.  Today her renal function has taken a sharp climb to a level of 5 + and she has remained unresponsive today, and she has lost her iv access.  Her daughter Romie Minus and her spouse Broadus John, her husband and his son her Mona arrived for goals of care.  Unfortunately, her son Rush Landmark had to work today and is the official POA , family asserts that they can act on his behalf.  He will be available briefly from 400 pm- 410 pm only as he drives public bus.   Family was updated on severity of illness and possible death at anytime.  We talked about what full code status would mean for her and her present family while not POA agree that DNR status is appropriate.  Medically , I have reviewed this case with the patient's primary MD Rizwan and it is appropriate to assert a DNR on her behalf as she is so near death.  I will continue to try to communicate with Bill to update him and affirm his wishes for her but this is the best choice for this patient and the majority of  her available children concur with DNR.  I talked with the family regarding her tenuous state.  They are leaning toward transition to full comfort care with hospice to help .  We just need to talk with Bill to affirm next best steps.    Goals of Care: 1.  Code Status: Change code status to DNR   2. Scope of Treatment: Will confirm with Bill in next one hour transition orders for comfort care.   4. Disposition: to be determined.  If patient survives the night consider GIP if symptoms are present for management.   3. Symptom Management: Until we speak with the patient's son will continue current treatments.  Explained to family that patient has no IV access.  PEG can be used if needed.  Family discussing stopping feeds.   4. Psychosocial: Second marriage, has multiple children- 2 daughters, 2 sons and several step children.  5. Spiritual: Spiritual care will be offered.        Patient Documents Completed or Given: Document Given Completed  Advanced Directives Pkt    MOST    DNR    Gone from My Sight    Hard Choices      Brief HPI: 80 yr old african [place] female with baseline cognitive issues noted during last admission admitted with Influenza and superinfection pneumonia right  lung.  Patient gravely ill .  Death could be imminent. I have been asked to assist with goals of care.  Family is known to me from visit back in December.   ROS: unable due to patient unconcious    PMH:  Past Medical History  Diagnosis Date  . Atrial fibrillation   . Hyperlipemia   . GERD (gastroesophageal reflux disease)   . Hypertension   . Breast cancer   . Angina at rest   . Myocardial infarction 2008    Dr Einar Gip  . Dementia oct 2014    memory issues since oct 2014  . Constipation   . Renal disorder     Acute kidney failure     PSH: Past Surgical History  Procedure Laterality Date  . Breast surgery    . Lymphadenectomy    . Hiatal hernia repair    . Cesarean section      I have reviewed the FH and SH and  If appropriate update it with new information. Allergies  Allergen Reactions  . Penicillins Nausea And Vomiting    "almost pass out"   Scheduled Meds: . amiodarone  200 mg Per Tube BID  . ceFEPime (MAXIPIME) IV  1 g Intravenous Q24H  . free water  250 mL Per Tube Q6H  . oseltamivir  30 mg Per Tube Daily  . vancomycin  750 mg Intravenous Q48H   Continuous Infusions: . sodium chloride 50 mL/hr at 06/07/13 0600  . feeding supplement (JEVITY 1.2 CAL) 1,000 mL (06/07/13 0600)   PRN Meds:.morphine    BP 100/45  Pulse 100  Temp(Src) 98.6 F (37 C) (Core (Comment))  Resp 25  Ht 5\' 7"  (1.702 m)  Wt 55.1 kg (121 lb 7.6 oz)  BMI 19.02 kg/m2  SpO2 98%   PPS:10%   Intake/Output Summary (Last 24 hours) at 06/07/13 1416 Last data filed at 06/07/13 1230  Gross per 24 hour  Intake   2320 ml  Output    525 ml  Net   1795 ml   LBM:     2/3 Physical Exam:  General: cachectic, agonal breathing HEENT:  [place] , AT eyes half open, not rousing to tactile or verbal stimuli Chest:   Coarse wet rhonchi, no wheezing CVS: regular , S1, S2, no s3 or s4 Abdomen:soft,no grimace on palpation Ext: 3+ bilateral lower extremity edema Neuro:not responsive to tactile or verbal stimuli. Once in a while she will arouse spontaneously and have look out  Labs: CBC    Component Value Date/Time   WBC 18.8* 06/06/2013 0830   WBC 8.1 05/30/2006 1519   RBC 2.47* 06/06/2013 0830   RBC 3.79 05/30/2006 1519   HGB 6.8* 06/06/2013 0830   HGB 12.1 05/30/2006 1519   HCT 21.2* 06/06/2013 0830   HCT 35.2 05/30/2006 1519   PLT 160 06/06/2013 0830   PLT 384 05/30/2006 1519   MCV 85.8 06/06/2013 0830   MCV 93.0 05/30/2006 1519   MCH 27.5 06/06/2013 0830   MCH 32.0 05/30/2006 1519   MCHC 32.1 06/06/2013 0830   MCHC 34.4 05/30/2006 1519   RDW 18.9* 06/06/2013 0830   RDW 13.8 05/30/2006 1519   LYMPHSABS 1.6 06/05/2013 1120   LYMPHSABS 2.1 05/30/2006 1519   MONOABS 0.5 06/05/2013 1120   MONOABS 1.4*  05/30/2006 1519   EOSABS 0.0 06/05/2013 1120   EOSABS 0.1 05/30/2006 1519   BASOSABS 0.0 06/05/2013 1120   BASOSABS 0.0 05/30/2006 1519     CMP  Component Value Date/Time   NA 139 06/07/2013 0300   K 4.9 06/07/2013 0300   CL 96 06/07/2013 0300   CO2 18* 06/07/2013 0300   GLUCOSE 98 06/07/2013 0300   BUN 49* 06/07/2013 0300   CREATININE 5.14* 06/07/2013 0300   CALCIUM 9.2 06/07/2013 0300   PROT 5.8* 06/05/2013 1120   ALBUMIN 1.0* 06/05/2013 1120   AST 26 06/05/2013 1120   ALT 10 06/05/2013 1120   ALKPHOS 60 06/05/2013 1120   BILITOT 0.5 06/05/2013 1120   GFRNONAA 7* 06/07/2013 0300   GFRAA 8* 06/07/2013 0300    Chest Xray Reviewed/Impressions: patchy right infiltrate, COPD, bronchitic changes.    Time In Time Out Total Time Spent with Patient Total Overall Time  100 pm 215 pm 30 min 75 min    Greater than 50%  of this time was spent counseling and coordinating care related to the above assessment and plan.  Roxie Kreeger L. Lovena Le, MD MBA The Palliative Medicine Team at [facility] Phone: [phone] Pager: [phone]   Discussed with Dr. Wynelle Cleveland  . Please see addendum to follow.   Wynetta Seith L. Lovena Le, MD MBA The Palliative Medicine Team at [facility] Phone: [phone] Pager: [phone]

## 2013-06-07 NOTE — Progress Notes (Signed)
Chaney Malling NP made aware of rythm changes. EKG obtained. Shows SR with RBBB. Also reminded her that we have no IV access. New orders received for pain medication to be administered via PEG.

## 2013-06-07 NOTE — Consult Note (Addendum)
Patient HN:GITJLL [place] CARMACK      DOB: 11-Nov-1922      VDI:[id number]  Addendum to Initial Consult:  Met with Marchia Bond, and Bill ( POA) and Maurie Boettcher stayed with his dad and Tricha:  Updated on grave medical changes and implications of respiratory and renal failure.  Bill in agreement with DNR and family wishes to pursue full comfort care.  Arneses has been in pain of late and has asked the Lord to take her home.  As [facility] [place] states she has been in communication with the [facility] on a regular basis.  Family ok with stopping abx and tube feeds .  Will continue comfort medications via tube.  Will continue final doses of tamiflu so family will be protected.  Morphine via tube q 2 prn  For pain or dyspnea.  Can titrate morphine if needed and symptoms worsen.   Would not be surprised if patient had a hospital death, prognosis hours.   Total time:  [place] pm  Dr. Wynelle Cleveland updated.  Will transition to general medical floor with comfort care.   Consider GIP in am if patient remains with symptom management issues in the am.  Georgianna Band L. Lovena Le, MD MBA The Palliative Medicine Team at [facility] Phone: [phone] Pager: [phone]

## 2013-06-07 NOTE — Progress Notes (Signed)
CSW received call from [facility] admissions stating they have been in communication with the family and that the family has been interested in moving pt from [facility] to [facility] for some time. CSW explained family has a palliative care meeting to discuss goals of care at 1:00pm this afternoon, and that disposition will depend on how this meeting goes, particularly as family has expressed interested in residential hospice. Admissions with [name] understanding and CSW will update after meeting. CSW called pt's son [name] and explained that CSW has touched base with [place] and that CSW noticed [place] meeting at 1:00pm and will follow-up with the family to assist in any way possible with discharge, either to residential hospice or [facility] with hospice services.   [name], MSW, [facility] Clinical Social Worker [phone]

## 2013-06-07 NOTE — Progress Notes (Addendum)
CSW called palliative team and spoke with MD about meeting held with family this afternoon. MD explained POA was not able to attend meeting and that he will be calling MD later this afternoon, around 4:00pm. At this time, pt has been changed to DNR status and family is expressing interest in residential hospice but MD needs to confirm this plan with POA when he calls. MD explained pt might not survive the night, and that at this time disposition is not clear concerning discharge to residential hospice or GIP. CSW following case and will read palliative note to determine how best to support family.  Addendum: CSW introduced self to pt's family in room, explaining CSW is following with palliative MD and wanted to support family as best CSW can while pt is in the hospital. Family wondering if discharge to [facility] with hospice services would be possible, and CSW explained admissions at [facility] states it is possible. Family still interested in this, and CSW alerted facility. Facility submitting for insurance authorization now. CSW explained to admissions rep that CSW will follow-up tomorrow concerning pt's disposition and discharge planning if pt medically appropriate for this option. CSW will also follow up with family tomorrow to provide support.  Addendum: CSW read palliative note stating pt has hours left for life expectancy--pt transferred to 6N and 6N CSW has been given a handoff via voicemail message on pt/family case. This CSW signing off.  [name], MSW, [facility] Clinical Social Worker [phone]

## 2013-06-07 NOTE — Progress Notes (Signed)
Patient's family member at bedside.  Dr. Lovena Le also spoke with them.  They were notified of patient's transfer to [facility].  Report given to [place], [place]

## 2013-06-07 NOTE — Progress Notes (Signed)
NUTRITION FOLLOW UP  Intervention:   1. Enteral nutrition; if warranted and appropriate, advance TFs of Jevity 1.2 @ by 10 mL q 12 hrs to 50 mL/hr goal to provide 1440 kcal, 66g protein, 1200 mL free water.  May not be appropriate if pt is actively dying.    NUTRITION DIAGNOSIS:  Inadequate oral intake related to AMS, poor appetite AEB previous wt loss, poor nutrition hx   Monitor:  1. Enteral nutrition; initiation with tolerance if appropriate per MD. Pt to meet >/=90% estimated needs with nutrition support.  2. Wt/wt change; monitor trends  3. GOC; nutrition-related interventions to remain consistent with GOC/EOL wishes  Assessment:   Pt admitted with AMS and progressive FTT for 6 months. Pt had a PEG tube placed (1/30) and has been receiving Osmolite via tube. There is both Osmolite 1.2 and Osmolite 1.5 documented in medical hx provided by facility.  Pt with continued decline. Per MD note, in the past pt/family have wanted full, aggressive care for pt. Now with PNA and positive flu.   Pt with several recent hospitalizations. She has been found to meet criteria for severe malnutrition based on wasting, poor PO intake, and h/o of significant wt loss. This dx is ongoing.  Per MD note, pt may be actively dying.   MD has resumed trickle feeds of Jevity 1.2 @ 20 mL/hr continuous which provides 576 kcal, 26g protein, and 393 mL free water.     Height: Ht Readings from Last 1 Encounters:  06/05/13 5\' 7"  (1.702 m)    Weight Status:   Wt Readings from Last 1 Encounters:  06/07/13 121 lb 7.6 oz (55.1 kg)    Re-estimated needs:  Kcal: 1300-1460  Protein: 60-70 grams  Fluid: 1.3-1.5 L/day  Skin: generalized edema, stage 2 pressure ulcer  Diet Order: NPO   Intake/Output Summary (Last 24 hours) at 06/07/13 1350 Last data filed at 06/07/13 1230  Gross per 24 hour  Intake   2377 ml  Output    525 ml  Net   1852 ml    Last BM: 2/3   Labs:   Recent Labs Lab 06/05/13 1120  06/06/13 0830 06/07/13 0300  NA 156* 156* 139  K 3.2* 3.7 4.9  CL 126* 127* 96  CO2 15* 15* 18*  BUN 38* 39* 49*  CREATININE 1.60* 1.61* 5.14*  CALCIUM 8.6 8.7 9.2  MG  --  1.9  --   GLUCOSE 90 62* 98    CBG (last 3)  No results found for this basename: GLUCAP,  in the last 72 hours  Scheduled Meds: . amiodarone  200 mg Per Tube BID  . ceFEPime (MAXIPIME) IV  1 g Intravenous Q24H  . free water  250 mL Per Tube Q6H  . oseltamivir  30 mg Per Tube Daily  . vancomycin  750 mg Intravenous Q48H    Continuous Infusions: . sodium chloride 50 mL/hr at 06/07/13 0600  . feeding supplement (JEVITY 1.2 CAL) 1,000 mL (06/07/13 0600)    Brynda Greathouse, MS RD LDN Clinical Inpatient Dietitian Pager: [phone] Weekend/After hours pager: [phone]

## 2013-06-07 NOTE — Progress Notes (Signed)
[facility]  Lisa Nixon [id number] DOB: May 21, 1922 DOA: 06/29/2013 PCP: Laverda Page, MD  Brief narrative: 80 year old female patient with multiple medical problems. Resides at skilled nursing facility. Recent issues with progressive failure to thrive for at least 6 months PEG tube placed in the past week. Presented to the ER with progressive altered mentation fever and shortness of breath. Arrived to the emergency department in an unresponsive state. Patient was hypotensive in the emergency department and was given multiple fluid challenges up to 3 L. In addition she was tachycardic with heart rate in the 140s and a temperature of 103. Family was not available at time of the admitting physician's assessment. At some point prior to this presentation at the last family visit there was some mention of discussion and counseling regarding palliative care at that time the family felt the patient should still be a full code. HPI/Subjective: Patient unresponsive - have spoken with her daughter, Pamala Hurry and son, [name]. They are agreeable to DNR at this point. Awaiting palliative care team to make contact with the other son who is POA [name]) in order to discuss full comfort measures.   Assessment/Plan:    Sepsis -From influenza A and possibly secondary pneumonia -now has developed MSOF --family called our office 2/4 and requested formal Palliative evaluation-meeting pending at 1 pm 2/5 -still has persistent metabolic acidosis -initial PCT 8.08 and follow up 7.99   Acute renal failure/CKD 2 -unfortunately her renal fnx has dramatically worsened over night- now Scr is 5.14    Acute respiratory failure with hypoxia:   A) Hospital acquired PNA   B) Influenza A - poor prognostic indicator for patient this physiologically debilitated -Tamiflu initiated 06/05/13 -Continue empiric broad spectrum antibiotics for now -Continue supportive care    Atrial  fibrillation with RVR -RVR reflective of dehydration and physiologic response to fever and acute infectious stress -rates stable low 100s -Continue amiodarone per tube -Warfarin per pharmacy watching INR given concomitant use of antibiotic   Acute on chronic anemia -with hydration hgb has drifted down to 6.8- no signs active bleeding and hemodynamically stable- has poor venous access and since plan is for palliative eval will defer transfusion for now.    Diabetes -Not on medications preadmission and glucose actually somewhat low at presentation -Follow CBG every 4 hours    Hypernatremia/Dehydration -Continue 250 cc free water per tube -Na has normalized  Hypokalemia -replace prn    Protein-calorie malnutrition, severe -begin Jevity at 20 cc/hr and slow titrate up since active Flu Serum albumin low at 1.0 which is poor prognostic indicator -Give all medications prior tube    History of pulmonary embolism    Failure to thrive syndrome, adult, likely secondary to multiple debilitating illnesses -As above -Palliative eval pending 2/5  - prognosis is poor-today appears to be actively dying   Poor venous access -defer PICC placement at this time (see above)   Sacral decubitus -Air mattress  DVT prophylaxis: Warfarin Code Status: Full Family Communication: d/w family at bedside - recommend DNR- Dr Lovena Le in agreement. Have made the change in EPIC.  Disposition Plan/Expected LOS: Step down pending outcome palliative eval  Consultants: Palliative Care  Procedures: None  Antibiotics: Cefepime 2/2 >>> Vancomycin 2/2 >>> Tamiflu 2/3 >>>    Objective: Blood pressure 100/45, pulse 100, temperature 98.6 F (37 C), temperature source Core (Comment), resp. rate 25, height 5\' 7"  (1.702 m), weight 121 lb 7.6 oz (55.1 kg), SpO2  98.00%.  Intake/Output Summary (Last 24 hours) at 06/07/13 1337 Last data filed at 06/07/13 1230  Gross per 24 hour  Intake   2397 ml  Output     525 ml  Net   1872 ml   Exam: General: Labored resp pattern in setting of metabolic acidosis-cachectic, chronically ill as well as acutely toxic-appearing female Lungs: Coarse to auscultation bilaterally without wheezes or crackles, 3 L, effort as above Cardiovascular: Irregular tachycardic rate and rhythm without murmur gallop or rub normal S1 and S2, no peripheral edema or JVD Abdomen: Nontender, nondistended, soft, bowel sounds positive, no rebound, no ascites, no appreciable mass Musculoskeletal: No significant cyanosis, clubbing of bilateral lower extremities Neurological: Unresponsive  Scheduled Meds:  Scheduled Meds: . amiodarone  200 mg Per Tube BID  . ceFEPime (MAXIPIME) IV  1 g Intravenous Q24H  . free water  250 mL Per Tube Q6H  . oseltamivir  30 mg Per Tube Daily  . vancomycin  750 mg Intravenous Q48H   Continuous Infusions: . sodium chloride 50 mL/hr at 06/07/13 0600  . feeding supplement (JEVITY 1.2 CAL) 1,000 mL (06/07/13 0600)    Data Reviewed: Basic Metabolic Panel:  Recent Labs Lab 06/01/13 1248 06/20/2013 1813 06/05/13 1120 06/06/13 0830 06/07/13 0300  NA 153* 157* 156* 156* 139  K 4.1 5.5* 3.2* 3.7 4.9  CL 118* 122* 126* 127* 96  CO2 21 18* 15* 15* 18*  GLUCOSE 67* 96 90 62* 98  BUN 32* 38* 38* 39* 49*  CREATININE 1.86* 1.67* 1.60* 1.61* 5.14*  CALCIUM 10.8* 9.5 8.6 8.7 9.2  MG  --   --   --  1.9  --    Liver Function Tests:  Recent Labs Lab 06/03/2013 1813 06/05/13 1120  AST 62* 26  ALT 14 10  ALKPHOS 70 60  BILITOT 0.4 0.5  PROT 7.5 5.8*  ALBUMIN 1.4* 1.0*   CBC:  Recent Labs Lab 06/01/13 1248 06/21/2013 1813 06/05/13 1120 06/05/13 1819 06/06/13 0830  WBC 13.0* 22.1* 22.5*  --  18.8*  NEUTROABS  --  19.3* 20.4*  --   --   HGB 8.9* 8.7* 6.9* 7.8* 6.8*  HCT 28.5* 27.9* 21.2* 24.5* 21.2*  MCV 90.2 88.9 87.2  --  85.8  PLT 262 180 142*  --  160    Recent Results (from the past 240 hour(s))  URINE CULTURE     Status: None    Collection Time    06/29/2013  7:08 PM      Result Value Range Status   Specimen Description URINE, CATHETERIZED   Final   Special Requests NONE   Final   Culture  Setup Time     Final   Value: 06/05/2013 04:02     Performed at [facility] Count     Final   Value: NO GROWTH     Performed at [facility]   Culture     Final   Value: NO GROWTH     Performed at [facility]   Report Status 06/06/2013 FINAL   Final  CULTURE, BLOOD (ROUTINE X 2)     Status: None   Collection Time    06/05/2013  8:30 PM      Result Value Range Status   Specimen Description BLOOD ARM LEFT   Final   Special Requests BOTTLES DRAWN AEROBIC AND ANAEROBIC 10CC   Final   Culture  Setup Time     Final   Value:  06/05/2013 04:40     Performed at [facility]   Culture     Final   Value:        BLOOD CULTURE RECEIVED NO GROWTH TO DATE CULTURE WILL BE HELD FOR 5 DAYS BEFORE ISSUING A FINAL NEGATIVE REPORT     Performed at [facility]   Report Status PENDING   Incomplete  CULTURE, BLOOD (ROUTINE X 2)     Status: None   Collection Time    24-Jun-2013  8:45 PM      Result Value Range Status   Specimen Description BLOOD ARM RIGHT   Final   Special Requests BOTTLES DRAWN AEROBIC ONLY 4CC   Final   Culture  Setup Time     Final   Value: 06/05/2013 04:41     Performed at [facility]   Culture     Final   Value:        BLOOD CULTURE RECEIVED NO GROWTH TO DATE CULTURE WILL BE HELD FOR 5 DAYS BEFORE ISSUING A FINAL NEGATIVE REPORT     Performed at [facility]   Report Status PENDING   Incomplete  MRSA PCR SCREENING     Status: None   Collection Time    06/05/13 12:25 AM      Result Value Range Status   MRSA by PCR NEGATIVE  NEGATIVE Final   Comment:            The GeneXpert MRSA Assay (FDA     approved for NASAL specimens     only), is one component of a     comprehensive MRSA colonization     surveillance program. It is not     intended to  diagnose MRSA     infection nor to guide or     monitor treatment for     MRSA infections.     Studies:  Recent x-ray studies have been reviewed in detail by the Attending Physician  Time spent : 76mins     Allison Ellis, ANP [facility] Office  [phone] Pager [phone]  **If unable to reach the above provider after paging please contact the [place] @ [phone]  On-Call/Text Page:      [email or web address]      password [email or web address]  If 7PM-7AM, please contact night-coverage [email or web address] Password [email or web address] 06/07/2013, 1:37 PM   LOS: 3 days    I have examined the patient, reviewed the chart and modified the above note which I agree with.   Keimani Laufer,MD [phone] 06/07/2013, 2:41 PM

## 2013-06-08 MED ORDER — LORAZEPAM 2 MG/ML PO CONC: 1.0000 mg | ORAL | Status: DC | PRN

## 2013-06-08 MED ORDER — MORPHINE SULFATE (CONCENTRATE) 10 MG /0.5 ML PO SOLN
5.0000 mg | ORAL | Status: DC
  Administered 2013-06-08 – 2013-06-11 (×28): 5 mg via SUBLINGUAL
  Filled 2013-06-08 (×28): qty 0.5

## 2013-06-08 MED ORDER — MORPHINE SULFATE 10 MG/5ML PO SOLN
5.0000 mg | ORAL | Status: DC
  Administered 2013-06-08 (×5): 5 mg via ORAL
  Filled 2013-06-08 (×5): qty 5

## 2013-06-08 MED ORDER — MORPHINE SULFATE 10 MG/5ML PO SOLN
5.0000 mg | ORAL | Status: DC | PRN
Start: 2013-06-08 — End: 2013-06-08
  Administered 2013-06-08: 5 mg via ORAL
  Filled 2013-06-08: qty 5

## 2013-06-08 MED ORDER — MORPHINE SULFATE (CONCENTRATE) 10 MG /0.5 ML PO SOLN: 5.0000 mg | ORAL | Status: DC | PRN

## 2013-06-08 MED ORDER — SCOPOLAMINE 1 MG/3DAYS TD PT72
1.0000 | MEDICATED_PATCH | TRANSDERMAL | Status: DC
  Administered 2013-06-08: 1.5 mg via TRANSDERMAL
  Filled 2013-06-08 (×3): qty 1

## 2013-06-08 MED ORDER — LORAZEPAM 1 MG PO TABS
1.0000 mg | ORAL_TABLET | ORAL | Status: DC | PRN
  Administered 2013-06-08 (×2): 1 mg via ORAL
  Filled 2013-06-08 (×2): qty 1

## 2013-06-08 NOTE — Progress Notes (Signed)
UPdated MD on patient status.

## 2013-06-08 NOTE — Discharge Summary (Deleted)
    Death Summary  Jhordan Kinter Wurzer QHU:[id number] DOB: 07-12-1922 DOA: 05-Jun-2013  PCP: Laverda Page, MD PCP/Office notified:   Admit date: 2013/06/05 Date of Death: 06-09-13  Final Diagnoses:  Active Problems:   Sepsis due to Influenza A and Hospital acquired PNA   Acute respiratory failure with hypoxia due to Influenza A and HCAP   Atrial fibrillation with RVR-resolved   Acute renal failure   Diabetes   Hypernatremia/Dehydration   Protein-calorie malnutrition, severe   History of pulmonary embolism   Failure to thrive syndrome, adult, likely secondary to multiple debilitating illnesses   Myelodysplasia?? with leukocytosis and thrombocytosis   Poor venous access    History of present illness:  80 year old female patient with multiple medical problems. Resides at skilled nursing facility. Recent issues with progressive failure to thrive for at least 6 months PEG tube placed in the past week. Presented to the ER with progressive altered mentation fever and shortness of breath. Arrived to the emergency department in an unresponsive state. Patient was hypotensive in the emergency department and was given multiple fluid challenges up to 3 L. In addition she was tachycardic with heart rate in the 140s and a temperature of 103. Family was not available at time of the admitting physician's assessment. At some point prior to this presentation at the last family visit there was some mention of discussion and counseling regarding palliative care at that time the family felt the patient should still be a full code.   Hospital Course:  Patient was admitted to the step down unit where shortly after admission PCR returned positive for influenza A. It was also suspected she had a secondary healthcare acquired pneumonia. She was continued on oxygen, empiric antibiotics and other supportive care given. She presented severely dehydrated 9with hypotension) for multiple reasons including acute infectious  pulmonary illness as well as ongoing failure to thrive therefore aggressive IV hydration was initiated. Tamiflu was also initiated. Her Procalcitonin was 8.08. She had mild renal failure at time of presentation.  24 hours after admission the patient became more alert but was somewhat agitated. IV fluids were adjusted and free water was begun per tube as well as low-flow tube feedings. On same date family confirmed that they wished palliative evaluation for goals of care due to concerns patient would not survive the hospitalization. On same date it was noted that after hydration patient's hemoglobin had decreased to 6.8. She was hemodynamically stable without signs of active bleeding and since plans were to proceed with palliative evaluation in the next 24 hours it was opted not to pursue transfusion. The following morning the hemoglobin remained stable at 6.8 but unfortunately she had marked progression of her renal failure and her creatinine was 5.14.  On 06/07/2013 palliative medicine met  with members of the family including the power of attorney and confirmed that a comfort-based approach was what the patient would want. All unnecessary treatments and medications were discontinued. Comfort medications including morphine were initiated. Patient was reevaluated on 2013-06-09 and was noted to be unresponsive with a neurogenic breathing pattern and it was anticipated death would be imminent.    Time:   Signed:  ELLIS,ALLISON L. ANP [facility] 2013/06/09, 1:41 PM

## 2013-06-08 NOTE — Progress Notes (Signed)
[facility] [place] TEAM 1 - Stepdown/ICU TEAM  Pt seen for f/u visit.  Respirations agonal.  No evidence of discomfort or emotional distress.  Spoke w/ son at bedside at length.  He is comfortable w/ current tx plan for comfort.   15 mins total spent on reviweding the case, evaluating the pt, and speaking to son at bedside.    Cherene Altes, MD [facility] Office  [phone] Pager [phone]  On-Call/Text Page:      [email or web address]      password [facility]

## 2013-06-08 NOTE — Progress Notes (Addendum)
Patient BP:Lisa Nixon      DOB: 1922-12-30      IDP:[id number]   Palliative Medicine Team at [facility] Progress Note    Subjective: Son Lisa Nixon at the bedside. Reports that Lisa Nixon slept intermittently through the night but would wake occasionally to moan.  During my visit patient did call out and roll shoulders as if uncomfortable. She is known to have chronic back pain which has worsened recently.   Lisa Nixon has given me permission to schedule her  Pain meds for comfort.  Her prognosis remains hours- day. Although she has early signs of mottling.    Filed Vitals:   06/08/13 0518  BP: 120/56  Pulse: 99  Temp: 98.2 F (36.8 C)  Resp: 24   Physical exam:  General : eyes closes some moaning.  Pupils not examined Chest with coarse wet rhonchi, no wheezing CVS: regular, S1, S2 Abd: soft Ext: feet starting to diuresis a little Neuro: not responsive to tactile or verbal stimuli     Assessment and plan: 80 yr old african [place] female admitted with failure to thrive, fever.  Patient found to influenza A with pneumonia and respiratory failure.  Patient is actively dying.  Overnight has had more groaning and moaning.  Spoke with Lisa Nixon [facility])  He is ok with scheduling medications to prevent peaks and troughs in symptom management.  1.  DNR  2.  Acute on Chronic Pain and dyspnea:  Will schedule Roxanol q 2 which has been the interval that she has had symptoms  3.  Ativan prn for agitation 4.  Add scopolamine for secretions    Order comfort cart.    Glad to act as attending if patient meets GIP criteria.  Currently , symptoms being treated may not meet GIP level would need to be reviewed with the hospice of the their choice.  Prognosis - hours.  Danyeal Akens L. Lovena Le, MD MBA The Palliative Medicine Team at [facility] Phone: [phone] Pager: [phone]   Time [phone] am

## 2013-06-08 NOTE — Progress Notes (Signed)
Nutrition Brief Note  Chart reviewed. Pt now transitioning to comfort care. TFs have been discontinued.  Hospital death may be expected. No further nutrition interventions warranted at this time.  Please re-consult as needed.   Brynda Greathouse, MS RD LDN Clinical Inpatient Dietitian Pager: [phone] Weekend/After hours pager: [phone]

## 2013-06-09 NOTE — Progress Notes (Signed)
Patient Lisa Nixon      DOB: [birth year]      ZPH:[id number]   Palliative Medicine Team at [facility] Progress Note    Subjective: No family at the bedside.  Patient with shallow respirations.  Appears comfortable.  Overnight had increased work of breathing and distress so we switched to SL morphine for more rapid response.  She currently looks more comfortable.    Filed Vitals:   06/09/13 0525  BP: 90/41  Pulse: 36  Temp: 94.7 F (34.8 C)  Resp: 15   Physical exam:  General: somnolent , no moaning or distress,  Pupils not examined Chest decreased no rhonchi CVS: regular s1, s2 Abd: no grimace Ext warm, early mottling N[place]uo : not responsive     Assessment and plan: 80 yr old african [place] female with respiratory failure, influenza A , failure to thrive  1.  DNR  2.  Dyspnea: continue scheduled and prn roxanol  3.  Terminal secretions; atropine  4.  Pain : continue scheduled and prn roxanol.   Total time: [place] Lovena Le, MD MBA The Palliative Medicine Team at [facility] Phone: [phone] Pager: [phone]

## 2013-06-09 NOTE — Progress Notes (Addendum)
  [place] TEAM 1 - Stepdown/ICU TEAM  Pt seen for f/u visit.  Respirations remain agonal.  No evidence of discomfort or emotional distress.  Spoke w/ son and multiple other family members at bedside at length.    15 mins total spent on reviweding the case, evaluating the pt, and speaking to family at bedside.    Cherene Altes, MD [facility] Office  [phone] Pager [phone]  On-Call/Text Page:      [email or web address]      password [facility]

## 2013-06-10 NOTE — Progress Notes (Signed)
  [place] TEAM 1 - Stepdown/ICU TEAM  Pt seen for f/u visit.  Respirations w/o change.  No evidence of discomfort or emotional distress.  Spoke w/ son and multiple other family members at length.    15 mins total spent on reviewing the case, evaluating the pt, and speaking to family at bedside.    Cherene Altes, MD [facility]  [phone] Pager [phone]  On-Call/Text Page:      [email or web address]      password [facility]

## 2013-06-11 LAB — CULTURE, BLOOD (ROUTINE X 2)
Culture: NO GROWTH
Culture: NO GROWTH

## 2013-06-13 NOTE — Discharge Summary (Signed)
   Death Summary  Lisa Nixon WIO:[id number] DOB: [birth year] DOA: 06-20-2013  PCP: Laverda Page, MD PCP/Office notified:   Admit date: 06/20/2013 Date of Death: Jun 27, 2013  Final Diagnoses:    Sepsis due to Influenza A and Hospital acquired PNA   Acute respiratory failure with hypoxia due to Influenza A and HCAP   Atrial fibrillation with RVR-resolved   Acute renal failure   Diabetes   Hypernatremia/Dehydration   Protein-calorie malnutrition, severe   History of pulmonary embolism   Failure to thrive syndrome, adult, likely secondary to multiple debilitating illnesses   Myelodysplasia?? with leukocytosis and thrombocytosis  History of present illness:  80 year old female patient with a hx of multiple medical problems. Resided at skilled nursing facility. Had been suffering with ongoing issues with progressive failure to thrive for at least 6 months w/ PEG tube placed the week prior to admit. Presented to the ER with progressive altered mentation, fever, and shortness of breath. Arrived to the emergency department in an unresponsive state. Patient was hypotensive in the emergency department and was given multiple fluid challenges up to 3 L. In addition she was tachycardic with heart rate in the 140s and a temperature of 103.   Hospital Course:  Patient was admitted to the step down unit where shortly after admission PCR returned positive for influenza A. It was also suspected she had a secondary healthcare acquired pneumonia. She was continued on oxygen, empiric antibiotics and other supportive care given. She presented severely dehydrated with hypotension for multiple reasons including acute infectious pulmonary illness as well as ongoing failure to thrive therefore aggressive IV hydration was initiated. Tamiflu was also initiated. Her Procalcitonin was 8.08. She had mild renal failure at time of presentation.  24 hours after admission the patient became more alert but was somewhat  agitated. IV fluids were adjusted and free water was begun per tube as well as low-flow tube feedings. On same date family confirmed that they wished palliative evaluation for goals of care due to concerns patient would not survive the hospitalization. On same date it was noted that after hydration patient's hemoglobin had decreased to 6.8. She was hemodynamically stable without signs of active bleeding and since plans were to proceed with palliative evaluation in the next 24 hours it was opted not to pursue transfusion. The following morning the hemoglobin remained stable at 6.8 but unfortunately she had marked progression of her renal failure and her creatinine was 5.14.  On 06/07/2013 Palliative Care met  with members of the family including the power of attorney and confirmed that a comfort-based approach was what the patient would want. All unnecessary treatments and medications were discontinued. Comfort medications including morphine were initiated. Patient was reevaluated on 06/08/2013 and was noted to be unresponsive with a neurogenic breathing pattern and it was anticipated death would be imminent.  She passed away at 03:00 on 2013/06/27.  Her family kept vigil at her bedside th/o the majority of her stay.    Signed:  Cherene Altes, MD [facility] 06/13/2013, 7:47 PM

## 2013-07-01 NOTE — Progress Notes (Addendum)
Patient TD:Lisa Nixon      DOB: 1922-10-17      AGT:[id number]  Patient remains declining but comfortable. Spouse and step son at bedside. Continue current treatments.   Orella Cushman L. Lovena Le, MD MBA The Palliative Medicine Team at [facility] Phone: [phone] Pager: [phone]

## 2013-07-01 NOTE — Progress Notes (Signed)
At 0300 pt ceased respirations and pulse.  Witnessed by myself and Ko.Letta Moynahan, RN.  Son, Ninfa Meeker, notified.  Physician notified.

## 2013-07-01 NOTE — Progress Notes (Signed)
Chaplain rendered grief support by sitting and conversing with pt's family.  Chaplain employed gifts of compassion and empathic listening.  Chaplain prayed with pt's family.   07/06/13 0900  Clinical Encounter Type  Visited With Patient and family together  Visit Type Spiritual support;Death  Stress Factors  Family Stress Factors Loss    Estelle June, chaplain pager [phone]

## 2013-07-01 DEATH — deceased
